# Patient Record
Sex: Male | Born: 1960 | Race: Black or African American | Hispanic: No | Marital: Married | Smoking: Former smoker
Health system: Southern US, Community
[De-identification: ages and names within clinical notes are randomized; demographics above are authoritative.]

## PROBLEM LIST (undated history)

## (undated) DIAGNOSIS — I10 Essential (primary) hypertension: Secondary | ICD-10-CM

## (undated) DIAGNOSIS — K429 Umbilical hernia without obstruction or gangrene: Secondary | ICD-10-CM

## (undated) DIAGNOSIS — I5022 Chronic systolic (congestive) heart failure: Principal | ICD-10-CM

## (undated) DIAGNOSIS — E785 Hyperlipidemia, unspecified: Secondary | ICD-10-CM

## (undated) DIAGNOSIS — I472 Ventricular tachycardia: Secondary | ICD-10-CM

## (undated) DIAGNOSIS — I255 Ischemic cardiomyopathy: Secondary | ICD-10-CM

## (undated) DIAGNOSIS — I241 Dressler's syndrome: Secondary | ICD-10-CM

## (undated) DIAGNOSIS — I219 Acute myocardial infarction, unspecified: Secondary | ICD-10-CM

## (undated) DIAGNOSIS — I2510 Atherosclerotic heart disease of native coronary artery without angina pectoris: Secondary | ICD-10-CM

## (undated) DIAGNOSIS — K219 Gastro-esophageal reflux disease without esophagitis: Secondary | ICD-10-CM

## (undated) HISTORY — PX: DEFIBRILLATOR FUNCTION TEST: SHX5795

## (undated) HISTORY — DX: Gastro-esophageal reflux disease without esophagitis: K21.9

## (undated) HISTORY — DX: Chronic systolic (congestive) heart failure: I50.22

## (undated) HISTORY — DX: Essential (primary) hypertension: I10

## (undated) HISTORY — DX: Dressler's syndrome: I24.1

## (undated) HISTORY — DX: Atherosclerotic heart disease of native coronary artery without angina pectoris: I25.10

## (undated) HISTORY — DX: Ischemic cardiomyopathy: I25.5

## (undated) HISTORY — DX: Acute myocardial infarction, unspecified: I21.9

## (undated) HISTORY — DX: Hyperlipidemia, unspecified: E78.5

## (undated) HISTORY — DX: Umbilical hernia without obstruction or gangrene: K42.9

---
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
## 2002-11-10 ENCOUNTER — Emergency Department (HOSPITAL_COMMUNITY): Admission: EM | Admit: 2002-11-10 | Discharge: 2002-11-10 | Payer: Self-pay | Admitting: Emergency Medicine

## 2003-03-31 ENCOUNTER — Ambulatory Visit (HOSPITAL_COMMUNITY): Admission: RE | Admit: 2003-03-31 | Discharge: 2003-03-31 | Payer: Self-pay | Admitting: General Surgery

## 2005-03-30 ENCOUNTER — Emergency Department (HOSPITAL_COMMUNITY): Admission: EM | Admit: 2005-03-30 | Discharge: 2005-03-30 | Payer: Self-pay | Admitting: Emergency Medicine

## 2005-11-19 ENCOUNTER — Ambulatory Visit: Payer: Self-pay | Admitting: Family Medicine

## 2006-03-05 ENCOUNTER — Ambulatory Visit: Payer: Self-pay | Admitting: Cardiovascular Disease

## 2006-03-05 ENCOUNTER — Inpatient Hospital Stay (HOSPITAL_COMMUNITY): Admission: AD | Admit: 2006-03-05 | Discharge: 2006-03-11 | Payer: Self-pay | Admitting: Cardiology

## 2006-03-05 ENCOUNTER — Encounter: Payer: Self-pay | Admitting: Emergency Medicine

## 2006-03-06 ENCOUNTER — Encounter: Payer: Self-pay | Admitting: Cardiology

## 2006-03-18 ENCOUNTER — Encounter (HOSPITAL_COMMUNITY): Admission: RE | Admit: 2006-03-18 | Discharge: 2006-04-17 | Payer: Self-pay | Admitting: Cardiovascular Disease

## 2006-03-31 ENCOUNTER — Ambulatory Visit: Payer: Self-pay | Admitting: Cardiovascular Disease

## 2006-04-07 ENCOUNTER — Encounter: Payer: Self-pay | Admitting: Family Medicine

## 2006-04-07 DIAGNOSIS — I10 Essential (primary) hypertension: Secondary | ICD-10-CM | POA: Insufficient documentation

## 2006-04-07 DIAGNOSIS — K219 Gastro-esophageal reflux disease without esophagitis: Secondary | ICD-10-CM | POA: Insufficient documentation

## 2006-04-07 DIAGNOSIS — K649 Unspecified hemorrhoids: Secondary | ICD-10-CM | POA: Insufficient documentation

## 2006-04-19 ENCOUNTER — Emergency Department (HOSPITAL_COMMUNITY): Admission: EM | Admit: 2006-04-19 | Discharge: 2006-04-20 | Payer: Self-pay | Admitting: Emergency Medicine

## 2006-05-21 ENCOUNTER — Encounter: Payer: Self-pay | Admitting: Cardiology

## 2006-05-21 ENCOUNTER — Ambulatory Visit: Payer: Self-pay | Admitting: Cardiovascular Disease

## 2006-05-21 ENCOUNTER — Ambulatory Visit: Payer: Self-pay

## 2006-06-12 ENCOUNTER — Ambulatory Visit: Payer: Self-pay | Admitting: Cardiovascular Disease

## 2006-06-12 LAB — CONVERTED CEMR LAB
ALT: 22 units/L (ref 0–40)
Albumin: 3.6 g/dL (ref 3.5–5.2)
Alkaline Phosphatase: 43 units/L (ref 39–117)
HDL: 33.6 mg/dL — ABNORMAL LOW (ref >39.0)
LDL Cholesterol: 35 mg/dL (ref 0–99)
Total Protein: 6.3 g/dL (ref 6.0–8.3)

## 2006-10-10 ENCOUNTER — Ambulatory Visit: Payer: Self-pay | Admitting: Cardiovascular Disease

## 2006-10-15 ENCOUNTER — Emergency Department (HOSPITAL_COMMUNITY): Admission: EM | Admit: 2006-10-15 | Discharge: 2006-10-15 | Payer: Self-pay | Admitting: Emergency Medicine

## 2007-03-16 ENCOUNTER — Emergency Department (HOSPITAL_COMMUNITY): Admission: EM | Admit: 2007-03-16 | Discharge: 2007-03-16 | Payer: Self-pay | Admitting: Emergency Medicine

## 2007-03-31 ENCOUNTER — Ambulatory Visit (HOSPITAL_COMMUNITY): Admission: RE | Admit: 2007-03-31 | Discharge: 2007-03-31 | Payer: Self-pay | Admitting: Orthopaedic Surgery

## 2007-04-05 HISTORY — PX: FINGER SURGERY: SHX640

## 2007-04-09 ENCOUNTER — Ambulatory Visit: Payer: Self-pay | Admitting: Cardiovascular Disease

## 2007-06-08 ENCOUNTER — Ambulatory Visit: Payer: Self-pay | Admitting: Cardiovascular Disease

## 2007-06-08 LAB — CONVERTED CEMR LAB
ALT: 26 units/L (ref 0–53)
AST: 22 units/L (ref 0–37)
Albumin: 3.8 g/dL (ref 3.5–5.2)
Bilirubin, Direct: 0.1 mg/dL (ref 0.0–0.3)
CO2: 30 meq/L (ref 19–32)
Chloride: 108 meq/L (ref 96–112)
Creatinine, Ser: 0.9 mg/dL (ref 0.4–1.5)
GFR calc Af Amer: 116 mL/min
Glucose, Bld: 99 mg/dL (ref 70–99)
HDL: 30.4 mg/dL — ABNORMAL LOW (ref >39.0)
Sodium: 142 meq/L (ref 135–145)
Total Protein: 6.8 g/dL (ref 6.0–8.3)

## 2008-01-05 ENCOUNTER — Ambulatory Visit: Payer: Self-pay | Admitting: Cardiovascular Disease

## 2008-07-19 DIAGNOSIS — E785 Hyperlipidemia, unspecified: Secondary | ICD-10-CM | POA: Insufficient documentation

## 2008-07-20 ENCOUNTER — Ambulatory Visit: Payer: Self-pay

## 2008-07-20 ENCOUNTER — Encounter: Payer: Self-pay | Admitting: Cardiovascular Disease

## 2008-07-20 ENCOUNTER — Ambulatory Visit: Payer: Self-pay | Admitting: Cardiovascular Disease

## 2008-07-24 DIAGNOSIS — I2510 Atherosclerotic heart disease of native coronary artery without angina pectoris: Secondary | ICD-10-CM | POA: Insufficient documentation

## 2008-07-27 ENCOUNTER — Telehealth: Payer: Self-pay | Admitting: Cardiovascular Disease

## 2008-07-28 ENCOUNTER — Encounter (INDEPENDENT_AMBULATORY_CARE_PROVIDER_SITE_OTHER): Payer: Self-pay | Admitting: *Deleted

## 2008-08-08 ENCOUNTER — Ambulatory Visit (HOSPITAL_COMMUNITY): Admission: RE | Admit: 2008-08-08 | Discharge: 2008-08-08 | Payer: Self-pay | Admitting: Cardiovascular Disease

## 2008-08-08 ENCOUNTER — Ambulatory Visit: Payer: Self-pay | Admitting: Cardiovascular Disease

## 2008-09-07 ENCOUNTER — Ambulatory Visit: Payer: Self-pay | Admitting: Internal Medicine

## 2008-09-07 DIAGNOSIS — I2589 Other forms of chronic ischemic heart disease: Secondary | ICD-10-CM | POA: Insufficient documentation

## 2008-09-07 DIAGNOSIS — I255 Ischemic cardiomyopathy: Secondary | ICD-10-CM | POA: Insufficient documentation

## 2010-04-01 LAB — CONVERTED CEMR LAB
ALT: 28 units/L (ref 0–53)
AST: 24 units/L (ref 0–37)
Alkaline Phosphatase: 50 units/L (ref 39–117)
Basophils Relative: 0.1 % (ref 0.0–3.0)
Chloride: 106 meq/L (ref 96–112)
Cholesterol: 115 mg/dL (ref 0–200)
Creatinine, Ser: 1.2 mg/dL (ref 0.4–1.5)
Eosinophils Relative: 2.4 % (ref 0.0–5.0)
HDL: 29.2 mg/dL — ABNORMAL LOW (ref >39.00)
Hemoglobin: 13.4 g/dL (ref 13.0–17.0)
LDL Cholesterol: 65 mg/dL (ref 0–99)
Lymphocytes Relative: 38.2 % (ref 12.0–46.0)
Lymphs Abs: 3 10*3/uL (ref 0.7–4.0)
Monocytes Relative: 10.8 % (ref 3.0–12.0)
Neutro Abs: 3.8 10*3/uL (ref 1.4–7.7)
Potassium: 4.3 meq/L (ref 3.5–5.1)
RBC: 4.47 M/uL (ref 4.22–5.81)
Sodium: 140 meq/L (ref 135–145)
Total CHOL/HDL Ratio: 4
Triglycerides: 105 mg/dL (ref 0.0–149.0)
VLDL: 21 mg/dL (ref 0.0–40.0)
WBC: 7.8 10*3/uL (ref 4.5–10.5)

## 2010-05-21 ENCOUNTER — Ambulatory Visit (INDEPENDENT_AMBULATORY_CARE_PROVIDER_SITE_OTHER): Payer: Self-pay | Admitting: Cardiovascular Disease

## 2010-05-21 ENCOUNTER — Other Ambulatory Visit: Payer: Self-pay | Admitting: Cardiovascular Disease

## 2010-05-21 ENCOUNTER — Encounter: Payer: Self-pay | Admitting: Cardiovascular Disease

## 2010-05-21 DIAGNOSIS — I5022 Chronic systolic (congestive) heart failure: Secondary | ICD-10-CM

## 2010-05-21 DIAGNOSIS — I10 Essential (primary) hypertension: Secondary | ICD-10-CM

## 2010-05-21 DIAGNOSIS — I252 Old myocardial infarction: Secondary | ICD-10-CM

## 2010-05-21 DIAGNOSIS — E7800 Pure hypercholesterolemia, unspecified: Secondary | ICD-10-CM

## 2010-05-21 DIAGNOSIS — E785 Hyperlipidemia, unspecified: Secondary | ICD-10-CM

## 2010-05-21 LAB — BASIC METABOLIC PANEL
BUN: 13 mg/dL (ref 6–23)
CO2: 29 mEq/L (ref 19–32)
Calcium: 8.9 mg/dL (ref 8.4–10.5)
GFR: 110.44 mL/min (ref >60.00)
Glucose, Bld: 97 mg/dL (ref 70–99)
Sodium: 136 mEq/L (ref 135–145)

## 2010-05-21 LAB — LIPID PANEL: LDL Cholesterol: 67 mg/dL (ref 0–99)

## 2010-05-21 LAB — HEPATIC FUNCTION PANEL
AST: 24 U/L (ref 0–37)
Albumin: 4.1 g/dL (ref 3.5–5.2)
Alkaline Phosphatase: 48 U/L (ref 39–117)
Bilirubin, Direct: 0.1 mg/dL (ref 0.0–0.3)
Total Protein: 6.9 g/dL (ref 6.0–8.3)

## 2010-05-29 ENCOUNTER — Encounter: Payer: Self-pay | Admitting: Cardiovascular Disease

## 2010-06-05 NOTE — Assessment & Plan Note (Signed)
Summary: rov/F3M/mj    Visit Type:  Follow-up Referring Provider:  Dayle Points, MD Primary Provider:  Dr Juanetta Gosling  CC:  sob.  History of Present Illness: This is a 50 year-old gentleman presenting for followup of CAD and ischemic CM. He had a large anterior infarct in 2008 with severe residual LV dysfunction. The patient hasn't followed up in nearly 2 years. He was last seen by Dr Johney Frame for discussion of an ICD and he now would like to revisit this.  He complains of dyspnea with moderate exertion. No chest pain, lightheadedness, syncope, or palpitations. No edema, orthopnea, or PND. He has not been engaged in exercise.  Current Medications (verified): 1)  Simvastatin 80 Mg Tabs (Simvastatin) .... Take One Tablet By Mouth Daily At Bedtime 2)  Aspirin Ec 325 Mg Tbec (Aspirin) .... Take One Tablet By Mouth Daily 3)  Fish Oil 1200 Mg Caps (Omega-3 Fatty Acids) .... Take 1 Capsule By Mouth Once A Day 4)  Lisinopril 10 Mg Tabs (Lisinopril) .... Take One Tablet By Mouth Daily 5)  Coreg 25 Mg Tabs (Carvedilol) .... Take 1 Tablet By Mouth Two Times A Day 6)  Nitroglycerin 0.4 Mg Subl (Nitroglycerin) .... One Tablet Under Tongue Every 5 Minutes As Needed For Chest Pain---May Repeat Times Three 7)  Multivitamins  Tabs (Multiple Vitamin) .... Take 1 Tablet By Mouth Once A Day  Allergies: No Known Drug Allergies  Past History:  Past medical history reviewed for relevance to current acute and chronic problems.  Past Medical History: Reviewed history from 09/07/2008 and no changes required. 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 HEENT: normal Neck: normal carotid upstrokes without bruits, JVP normal Lungs: CTA CV: RRR without murmur or gallop Abd: soft, NT, positive BS, no bruit, no organomegaly Ext: no clubbing, cyanosis, or edema. peripheral pulses 2+ and equal Skin: warm and dry without rash    EKG  Procedure date:  05/21/2010  Findings:      NSR with age-indeterminate anterolateral infarct, age-indeterminate inferior infarct.  MRI EXAM  Procedure date:  08/08/2008  Findings:      Cardiac MR:   Impression:  1)    Moderate to severe LV enlargement with mid and distal anterior and inferior, septal and apical akinesis.  Findings  would be consistant with wrap around LAD infarct  2)    Full thickness scar involving large area of myocardium described by [facility] above 3)    Quantitative EF 23% 4)    Normal right sided cardiac chambers 5)    No pericardial effusion.  Impression & Recommendations:  Problem # 1:  ISCHEMIC CARDIOMYOPATHY (ICD-414.8) Pt with NYHA Class 2 symptoms. He is on appropriate med Rx and reports BP's at home 125/70's on average. Will  schedule for followup echo since LV function hasn't been assessed in past year and he would like to consider ICD. Previous LVEF 23% by cardiac MR and don't expect much improvement in setting full thickness scar. Will refer back to Dr Johney Frame.  His updated medication list for this problem includes:    Aspirin Ec 325 Mg Tbec (Aspirin) .Marland Kitchen... Take one tablet by mouth daily    Lisinopril 10 Mg Tabs (Lisinopril) .Marland Kitchen... Take one tablet by mouth daily    Coreg 25 Mg Tabs (Carvedilol) .Marland Kitchen... Take 1 tablet by mouth two times a day    Nitroglycerin 0.4 Mg Subl (Nitroglycerin) ..... One  tablet under tongue every 5 minutes as needed for chest pain---may repeat times three  Problem # 2:  CAD, NATIVE VESSEL (ICD-414.01) Stable without angina.  His updated medication list for this problem includes:    Aspirin Ec 325 Mg Tbec (Aspirin) .Marland Kitchen... Take one tablet by mouth daily    Lisinopril 10 Mg Tabs (Lisinopril) .Marland Kitchen... Take one tablet by mouth daily    Coreg 25 Mg Tabs (Carvedilol) .Marland Kitchen... Take 1 tablet by mouth two times a day    Nitroglycerin 0.4 Mg Subl (Nitroglycerin) ..... One tablet under tongue every 5 minutes as needed for chest pain---may repeat times three  Problem # 3:  HYPERLIPIDEMIA-MIXED (ICD-272.4)  Pt needs repeat lipids - will order.  His updated medication list for this problem includes:    Simvastatin 80 Mg Tabs (Simvastatin) .Marland Kitchen... Take one tablet by mouth daily at bedtime  Orders: TLB-Lipid Panel (80061-LIPID) TLB-Hepatic/Liver Function Pnl (80076-HEPATIC)  CHOL: 115 (07/20/2008)   LDL: 65 (07/20/2008)   HDL: 29.20 (07/20/2008)   TG: 105.0 (07/20/2008)  Problem # 4:  HYPERTENSION (ICD-401.9) As above BP's at home have been in range.  His updated medication list for this problem includes:    Aspirin Ec 325 Mg Tbec (Aspirin) .Marland Kitchen... Take one tablet by mouth daily    Lisinopril 10 Mg Tabs (Lisinopril) .Marland Kitchen... Take one tablet by mouth daily    Coreg 25 Mg Tabs (Carvedilol) .Marland Kitchen... Take 1 tablet by mouth two times a day  Orders: TLB-BMP (Basic Metabolic Panel-BMET) (80048-METABOL)  Patient Instructions: 1)  Your physician recommends that you schedule a follow-up appointment WITH DR Johney Frame 2)  Your physician recommends that you continue on your current medications as directed. Please refer to the Current Medication list given to you today. 3)  Your physician wants you to follow-up in:6 MONTHS WITH DR Theodoro Parma will receive a reminder letter in the mail two months in advance. If you don't receive a letter, please call our office to schedule the follow-up  appointment. Prescriptions: COREG 25 MG TABS (CARVEDILOL) Take 1 tablet by mouth two times a day  #180 x 3   Entered by:   Deliah Goody, RN   Authorized by:   Norva Karvonen, MD   Signed by:   Deliah Goody, RN on 05/21/2010   Method used:   Electronically to        [facility]  [address] (retail)       [address]       [place], [place]  [address]       Ph: [phone]       Fax: [phone]   RxID:   [id number] LISINOPRIL 10 MG TABS (LISINOPRIL) Take one tablet by mouth daily  #90 x 3   Entered by:   Deliah Goody, RN  Authorized by:   Norva Karvonen, MD   Signed by:   Deliah Goody, RN on 05/21/2010   Method used:   Electronically to        [facility]  [address] (retail)       [address]       [place], [place]  [address]       Ph: [phone]       Fax: [phone]   RxID:   [id number] SIMVASTATIN 80 MG TABS (SIMVASTATIN) Take one tablet by mouth daily at bedtime  #90 x 3   Entered by:   Deliah Goody, RN   Authorized by:   Norva Karvonen, MD   Signed by:   Deliah Goody, RN on 05/21/2010   Method used:   Electronically to        [facility]  [address] (retail)       [address]       [place], [place]  [address]       Ph: [phone]       Fax: [phone]   RxID:   [id number]   Appended Document: rov/F3M/mj Lauren - we ordered echo but I don't see it in patient instructions...can you confirm?

## 2010-06-14 ENCOUNTER — Telehealth: Payer: Self-pay | Admitting: Internal Medicine

## 2010-06-14 NOTE — Telephone Encounter (Signed)
Pt calls today b/c he could not remember who he had been referred to or when the appt is. He is seeing Dr. Johney Frame 06/27/10 at 9:30am.  Pt aware and is doing well. Mylo Red RN

## 2010-06-14 NOTE — Telephone Encounter (Signed)
Pt wants to know what doctor he was ref to.

## 2010-06-15 ENCOUNTER — Telehealth: Payer: Self-pay

## 2010-06-15 DIAGNOSIS — I2510 Atherosclerotic heart disease of native coronary artery without angina pectoris: Secondary | ICD-10-CM

## 2010-06-15 DIAGNOSIS — I2589 Other forms of chronic ischemic heart disease: Secondary | ICD-10-CM

## 2010-06-15 NOTE — Telephone Encounter (Signed)
I spoke with the pt and made him aware that he should have been scheduled for a follow-up echo from his 05/21/10 OV with Dr Excell Seltzer.  This test was not scheduled.  I made an appointment for the pt to have an echo performed on 06/25/10 at 3:00.  The pt is scheduled to see Dr Johney Frame on 06/27/10 for ICD consideration.

## 2010-06-25 ENCOUNTER — Ambulatory Visit (HOSPITAL_COMMUNITY): Payer: Self-pay | Attending: Cardiovascular Disease | Admitting: Radiology

## 2010-06-25 DIAGNOSIS — R0989 Other specified symptoms and signs involving the circulatory and respiratory systems: Secondary | ICD-10-CM | POA: Insufficient documentation

## 2010-06-25 DIAGNOSIS — R0609 Other forms of dyspnea: Secondary | ICD-10-CM | POA: Insufficient documentation

## 2010-06-25 DIAGNOSIS — I059 Rheumatic mitral valve disease, unspecified: Secondary | ICD-10-CM | POA: Insufficient documentation

## 2010-06-25 DIAGNOSIS — I079 Rheumatic tricuspid valve disease, unspecified: Secondary | ICD-10-CM | POA: Insufficient documentation

## 2010-06-25 DIAGNOSIS — I2510 Atherosclerotic heart disease of native coronary artery without angina pectoris: Secondary | ICD-10-CM | POA: Insufficient documentation

## 2010-06-25 DIAGNOSIS — E785 Hyperlipidemia, unspecified: Secondary | ICD-10-CM | POA: Insufficient documentation

## 2010-06-25 DIAGNOSIS — I2589 Other forms of chronic ischemic heart disease: Secondary | ICD-10-CM

## 2010-06-25 DIAGNOSIS — I10 Essential (primary) hypertension: Secondary | ICD-10-CM | POA: Insufficient documentation

## 2010-06-26 ENCOUNTER — Encounter: Payer: Self-pay | Admitting: Internal Medicine

## 2010-06-27 ENCOUNTER — Ambulatory Visit (INDEPENDENT_AMBULATORY_CARE_PROVIDER_SITE_OTHER): Payer: Self-pay | Admitting: Internal Medicine

## 2010-06-27 ENCOUNTER — Encounter: Payer: Self-pay | Admitting: Internal Medicine

## 2010-06-27 DIAGNOSIS — I2589 Other forms of chronic ischemic heart disease: Secondary | ICD-10-CM

## 2010-06-27 DIAGNOSIS — I5022 Chronic systolic (congestive) heart failure: Secondary | ICD-10-CM

## 2010-06-27 DIAGNOSIS — E785 Hyperlipidemia, unspecified: Secondary | ICD-10-CM

## 2010-06-27 DIAGNOSIS — I10 Essential (primary) hypertension: Secondary | ICD-10-CM

## 2010-06-27 DIAGNOSIS — I5020 Unspecified systolic (congestive) heart failure: Secondary | ICD-10-CM | POA: Insufficient documentation

## 2010-06-27 HISTORY — DX: Chronic systolic (congestive) heart failure: I50.22

## 2010-06-27 NOTE — Patient Instructions (Signed)
AICD implant May 31st  830am arrival for a 1030am case [facility] Call us if you can keep this appointment when you have insurance coverage. [phone]

## 2010-06-27 NOTE — Assessment & Plan Note (Signed)
He is on simvastatin 80mg  daily.  I have discussed with Dr Earmon Phoenix nurse to determine treatment strategy going forward.

## 2010-06-27 NOTE — Assessment & Plan Note (Signed)
The patient has an ischemic CM (EF 25% by echo 3/12), NYHA Class II/III CHF, and CAD. At this time, he meets MADIT II/ SCD-HeFT criteria for ICD implantation for primary prevention of sudden death.  His QRS is 98ms.  He is therefore not a candidate for CRT.  Risks, benefits, alternatives to ICD implantation were discussed in detail with the patient today. The patient  understands that the risks include but are not limited to bleeding, infection, pneumothorax, perforation, tamponade, vascular damage, renal failure, MI, stroke, death, inappropriate shocks, and lead dislodgement and wishes to proceed.  We will therefore schedule device implantation at the next available time.  He will also consider Analyze ST study and will discuss this with our research staff.  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  He is also aware than an ICD is not typically a guarantee for disability.  His mother in law recently died suddenly after declining to have an ICD implanted and he states that he therefore thinks that now is the appropriate time for him to proceed.

## 2010-06-27 NOTE — Assessment & Plan Note (Signed)
No ischemic symptoms. No changes today.  Medicines are optimized.

## 2010-06-27 NOTE — Assessment & Plan Note (Signed)
Controlled.  

## 2010-06-27 NOTE — Progress Notes (Signed)
The patient presents today for routine electrophysiology followup.  Since last being seen in our clinic, the patient reports doing very well.  He remains reasonably active but reports dypsnea with moderate activity.  He feels that if he walks more than several blocks he becomes SOB.  Today, he denies symptoms of palpitations, chest pain,  orthopnea, PND, lower extremity edema, dizziness, presyncope, syncope, or neurologic sequela.  The patient feels that he is tolerating medications without difficulties and is otherwise without complaint today.  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  Past Medical History  Diagnosis Date  . CAD (coronary artery disease)     anterior MI 2008, treated with bare metal stent to LAD  . MI (myocardial infarction)   . Ischemic cardiomyopathy     NYHA Class II/III CHF  . 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 HLD (hyperlipidemia)   . HTN (hypertension)   . GERD (gastroesophageal reflux disease)   . Umbilical hernia    Past Surgical History  Procedure Date  . Finger surgery 2/09    laceration repair    Current Outpatient Prescriptions  Medication Sig Dispense Refill  . aspirin 325 MG tablet Take 325 mg by mouth daily.        . carvedilol (COREG) 25 MG tablet Take 25 mg by mouth 2 (two) times daily with a meal.        . fish oil-omega-3 fatty acids 1000 MG capsule Take 1 g by mouth daily.       Marland Kitchen lisinopril (PRINIVIL,ZESTRIL) 10 MG tablet Take 10 mg by mouth daily.        . Multiple Vitamin (MULTIVITAMIN) tablet Take 1 tablet by mouth daily.        . nitroGLYCERIN (NITROSTAT) 0.4 MG SL tablet Place 0.4 mg under the tongue every 5 (five) minutes as needed.        . simvastatin (ZOCOR) 80 MG tablet Take 80 mg by mouth at bedtime.          No Known Allergies  History   Social History  . Marital Status: Married    Spouse Name: N/A    Number of Children: N/A  . Years of Education: N/A   Occupational History  . truck driver     Social History Main Topics  . Smoking status: Former Smoker    Types: Cigars    Quit date: 03/04/2006  . Smokeless tobacco: Not on file  . Alcohol Use: Yes     occasionally  . Drug Use: No  . Sexually Active: Not on file   Other Topics Concern  . Not on file   Social History Narrative   Lives in [place] with spouse.  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    Family History  Problem Relation Age of Onset  . Heart failure    . Diabetes      ROS-  All systems are reviewed and are negative except as outlined in the HPI above  Physical Exam: Filed Vitals:   06/27/10 0923  BP: 132/82  Pulse: 80  Height: 5\' 10"  (1.778 m)  Weight: 229 lb 1.9 oz (103.928 kg)    GEN- The patient is well appearing, alert and oriented x 3 today.   Head- normocephalic, atraumatic Eyes-  Sclera clear, conjunctiva pink Ears- hearing intact Oropharynx- clear Neck- supple, no JVP Lymph- no cervical lymphadenopathy Lungs- Clear to ausculation bilaterally, normal work  of breathing Heart- Regular rate and rhythm, no murmurs, rubs or gallops, PMI not laterally displaced GI- soft, NT, ND, + BS Extremities- no clubbing, cyanosis, or edema MS- no significant deformity or atrophy Skin- no rash or lesion Psych- euthymic mood, full affect Neuro- strength and sensation are intact  EKG- sinus rhythm 74 bpm, PR 162, QRS 98, anterolateral and inferior infarction  Assessment and Plan:

## 2010-07-09 ENCOUNTER — Encounter: Payer: Self-pay | Admitting: Cardiovascular Disease

## 2010-07-09 NOTE — Telephone Encounter (Signed)
York Spaniel he has been waiting for a disability letter since last visit, please call when ready and will come pick it up

## 2010-07-13 ENCOUNTER — Telehealth: Payer: Self-pay | Admitting: Cardiovascular Disease

## 2010-07-13 NOTE — Telephone Encounter (Signed)
Called this week needing a note for Disbility.  He has not heard anything regarding this note.  Please check and call pt.

## 2010-07-13 NOTE — Telephone Encounter (Signed)
I called and spoke with the pt. He states he spoke with Dr. Excell Seltzer about needing a note for disability. I explained that the message from earlier this week has been forwarded to Dr. Excell Seltzer, but there is no letter yet. I explained Dr. Excell Seltzer has been out of the office, but I will have his nurse, Lauren, contact him on Monday to update him on what is going on. He needs the note ASAP. 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 He is agreeable with Lauren calling him on Monday.

## 2010-07-16 NOTE — Telephone Encounter (Signed)
I spoke with the pt and made him aware that Dr Excell Seltzer is aware he needs disability letter.  We will try to complete letter this week and I will call pt when ready. Pt agrees with plan.

## 2010-07-16 NOTE — Telephone Encounter (Signed)
This encounter was created in error - please disregard.

## 2010-07-17 NOTE — Assessment & Plan Note (Signed)
[facility]                            CARDIOLOGY OFFICE NOTE   NAME:[name] [name], [name] [name]                   MRN:          [id number]  DATE:04/09/2007                            DOB:          04-23-1960    HISTORY:  [name] [name] was seen in followup at the [facility]  Cardiology office on April 09, 2007.  He is a 50 year old gentleman  with coronary artery disease.  He is now 1 year out from a large  anterior wall MI.  His MI was complicated by heavy thrombus burden and  distal clot in the LAD after stenting as well as post-MI pericarditis.   [name] [name] is doing relatively well at present.  He has returned to  work for some time now and is able to perform regular activities without  any symptoms.  He walks for exercise, but has not engaged in any heavy  physical exertion.  He has quit smoking since the time of his  infarction, but unfortunately he has gained a good bit of weight over  the last year.  He specifically denies chest pain, dyspnea, orthopnea,  PND or edema.   MEDICATIONS:  1. Simvastatin 80 mg at bedtime.  2. Aspirin 325 mg daily.  3. Fish oil daily.  4. Lisinopril 10 mg daily.  5. Coreg 25 mg twice daily.   ALLERGIES:  NKDA.   PHYSICAL EXAMINATION:  GENERAL:  On exam, the patient is alert,  oriented.  He is in no acute distress.  VITAL SIGNS:  Weight is 197, blood pressure 113/70, heart rate 67,  respiratory rate is 16.  HEENT:  Normal.  NECK:  Normal, carotid upstrokes without bruits.  Jugular venous  pressure is normal.  LUNGS:  Clear to auscultation bilaterally.  HEART:  Regular rate and rhythm without murmurs or gallops.  ABDOMEN:  Soft, nontender.  No organomegaly.  No abdominal bruits.  EXTREMITIES:  No clubbing, cyanosis or edema.  Peripheral pulses are 2+  and equal throughout.   DIAGNOSTICS:  EKG shows sinus rhythm with anterolateral MI, age  indeterminate and inferior MI, age indeterminate.  There are no acute  ST-  segment changes.   ASSESSMENT:  1. Coronary artery disease status post anterolateral myocardial      infarction.  The patient remains asymptomatic.  Continue current      medical regimen with aggressive secondary risk reduction.  He is      tolerating the goal dose of Coreg and is also tolerating lisinopril      without difficulty.  His post-MI LVEF was in the range of 35-40%      back when it was checked in March 2008.  2. Dyslipidemia.  He remains on simvastatin 80 mg.  Lipids in April      2008 showed a cholesterol of 78, HDL 34, LDL 35.  Continue current      therapy with recheck lipids and LFTs in 2 months.  3. Weight gain.  [name] [name] has gained 21 pounds over the last      year.  He was counseled on diet and exercise strategies.  Veverly Fells. Excell Seltzer, MD  Electronically Signed    MDC/MedQ  DD: 04/09/2007  DT: 04/10/2007  Job #: [id number]

## 2010-07-17 NOTE — Assessment & Plan Note (Signed)
[facility]                            CARDIOLOGY OFFICE NOTE   NAME:[name] [name] CALISE                   MRN:          [id number]  DATE:10/10/2006                            DOB:          August 30, 1960    [name] [name] returns for followup at the [facility] Cardiology office  on October 10, 2006.  [name] [name] is a 50 year old gentleman who  suffered an anterior wall myocardial infarction in January of this year.  [name] [name] was treated with stenting of the LAD but had poor flow at the  conclusion of the procedure secondary to thrombus burden in the apical  portion of the LAD.  [name] [name] also had post-MI pericarditis.  From a  symptomatic stand-point [name] [name] is doing very well.  [name] [name] has no complaints at  this point.  [name] [name] has been active and doing yard work and regular walking.  [name] [name] specifically denies chest pain, dyspnea, orthopnea, PND, light  headedness, syncope or palpitations.   CURRENT MEDICATIONS:  Include Simvastatin 80 mg, aspirin 325 mg, Coreg  12.5 mg twice daily, fish oil and Lisinopril 10 mg daily.   ALLERGIES:  NO KNOWN DRUG ALLERGIES.   EXAMINATION:  GENERAL:  [name] [name] is alert and oriented, in no acute distress.  VITAL SIGNS:  Weight is 179 pounds, blood pressure is 128/85, heart rate  is 59, respiratory rate 16.  HEENT:  Normal.  NECK:  Normal carotid upstrokes without bruits, jugular venous pressures  normal.  LUNGS:  Clear to auscultation bilaterally.  HEART:  The apex is discreet, non displaced, regular rate and rhythm  without murmurs or gallops.  ABDOMEN:  Soft, nontender, no organomegaly.  No abdominal bruits.  EXTREMITIES:  No clubbing, cyanosis or edema.  Peripheral pulses are 2+  and equal throughout.   ASSESSMENT:  PROBLEM #1:  Coronary artery disease status post anterior  wall myocardial infarction.  [name] [name] has moderate left ventricular  dysfunction secondary to his MI with an LVEF of 35-40%.  Will continue  his current medical therapy with a doubling  of his Coreg from 12.5 to 25  mg twice daily. Otherwise [name] [name] should remain on aspirin and Lisinopril at  their current doses.  [name] [name] is asymptomatic and I am very happy with his  clinical course at this point.  Will plan on seeing him back in 6 months  or sooner if any new problems arise.   PROBLEM #2:  Dyslipidemia.  [name] [name] is on simvastatin 80 mg at h.s.  LFTs  were normal in April and his lipid panel showed a total cholesterol of  78 with an LDL of 35 and an HDL of 34.   PROBLEM #3:  Hypertension.  Optimal control on a combination of  Lisinopril and Coreg.   PROBLEM #4:  Post MI pericarditis.  No clinical recurrence at this  point.   DISPOSITION:  I encouraged [name] [name] with regard to his walking  program.  [name] [name] is doing an excellent job of maintaining a medical program.  As above, I will see him back in followup in 6 months.     Veverly Fells. Excell Seltzer, MD  Electronically Signed  MDC/MedQ  DD: 10/17/2006  DT: 10/18/2006  Job #: [id number]

## 2010-07-17 NOTE — Assessment & Plan Note (Signed)
[facility]                            CARDIOLOGY OFFICE NOTE   NAME:[name] [name], [name] [name]                   MRN:          [id number]  DATE:01/05/2008                            DOB:          [birth year]    REASON FOR VISIT:  Followup CAD.   HISTORY OF PRESENT ILLNESS:  [name] [name] is a 50 year old gentleman  with coronary artery disease who had a anterolateral myocardial  infarction in 2008 complicated by post-MI pericarditis.  The patient  presents today for followup.  He is currently asymptomatic from a  cardiovascular standpoint.  He specifically denies chest pain, dyspnea,  orthopnea, PND, edema, lightheadedness, palpitations, or syncope.  He  has no claudication symptoms.  He has gained more weight since his last  visit.  This has become a trend over the course of his followup ever  since he discontinued cigarettes.  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  He thinks he will be able to control is  life much better with that job and is motivated to start an exercise  program.   MEDICATIONS:  1. Simvastatin 80 mg at bedtime.  2. Aspirin 325 mg daily.  3. Fish oil 1 daily.  4. Lisinopril 10 mg daily.  5. Coreg 25 mg twice daily.  6. Multivitamin 1 daily.   ALLERGIES:  NKDA.   PHYSICAL EXAMINATION:  GENERAL:  The patient is alert and oriented in no  acute distress.  VITAL SIGNS:  Weight is 208 pounds, this is increased from his visit in  February when he was 197 pounds.  At his visit in January 2008, he  weighed 176 pounds.  Blood pressure 139/87, heart rate 68, respiratory  rate 12.  HEENT:  Normal.  NECK:  Normal carotid upstrokes.  No bruits.  JVP normal.  LUNGS:  Clear bilaterally.  HEART:  Regular rate and rhythm.  No murmurs or gallops.  ABDOMEN:  Soft, nontender.   No organomegaly.  EXTREMITIES:  No clubbing, cyanosis, or edema.  Peripheral pulses are  intact and equal.   EKG shows normal sinus rhythm with age-indeterminate anterolateral MI  and age-indeterminate inferior MI.  There are no acute ST-segment or T-  wave changes.   ASSESSMENT:  1. Coronary artery disease status post anterolateral myocardial      infarction.  The patient is asymptomatic with no angina.  We will      continue his current medical program.  His follow up 2-D      echocardiogram from March 2008 showed an left ventricular ejection      fraction of 35-40%.  Since his left ventricular function was      borderline at that time, we will plan on follow up 2-D echo at the      time of his next office visit to reassess his left ventricular      function so that we can rule out adverse remodeling.  2. We will continue his current medical program with lisinopril and      carvedilol as well as full-dose aspirin.  3. Dyslipidemia.  He is on high-dose simvastatin.  Lipids from April      2009 showed a cholesterol of 87, triglycerides 44, HDL 30, LDL 48.      We will repeat at the time of his follow up office visit in April      2010.  4. Weight gain.  Extensive discussion again regarding strategies at      weight loss and specifically that we discussed reduction of starch      in his diet and increased exercise.   For followup, I will see Mr. Mozley back in 6 months.     Veverly Fells. Excell Seltzer, MD  Electronically Signed    MDC/MedQ  DD: 01/05/2008  DT: 01/05/2008  Job #: [id number]   cc:   Ramon Dredge L. Juanetta Gosling, M.D.

## 2010-07-17 NOTE — Op Note (Signed)
NAMEPRITHVI, [name] [name]            ACCOUNT NO.:  [id number]   MEDICAL RECORD NO.:  [id number]          PATIENT TYPE:  AMB   LOCATION:  DAY                           FACILITY:  [facility]   PHYSICIAN:  J. Darreld Mclean, M.D. DATE OF BIRTH:  1960/10/08   DATE OF PROCEDURE:  03/31/2007  DATE OF DISCHARGE:                               OPERATIVE REPORT   PREOPERATIVE DIAGNOSIS:  Laceration of the extensor tendon right long  finger near the DIP joint.   POSTOPERATIVE DIAGNOSIS:  Laceration of the extensor tendon right long  finger near the DIP joint.   PROCEDURE:  Repair of extensor tendon near the DIP joint.   ANESTHESIA:  Bier block.   TOURNIQUET TIME:  Please refer to anesthesia record.   DRAINS:  None.   Finger dressing plus splint applied at the end of the procedure.   SURGEON:  J. Darreld Mclean, M.D.   INDICATIONS:  Patient is a 46-year male who cut his the right long  finger piece of metal at his home.  He was seen in the emergency room,  there was a question of a partial tear of the extensor tendon.  I saw  him in the office the following day.  He had partial function of the  tendon.  We kept him in the splint.  I saw him back in the office late  last week and it was obvious that he had loss of extension of the DIP  joint.  The wound had healed and the sutures were removed.  I explained  to him that he needed surgical repair.  He appeared to understand the  risks and imponderables were discussed.  He appeared to understand and  agreed to the procedure.   PROCEDURE:  Patient was seen in the holding area.  The extensor tendon  to the right long finger was marked by him as the site, I marked it.  He  was brought to the operating room and placed supine on the operating  room table with hand table attached.  He was given Bier block  anesthesia.  He was prepped and draped in the usual manner.  We had a  time-out identifying [name] as the patient and the right long  finger  dorsal as the correct surgical site.  The previous wound was  opened and there was no signs of extensor tendon there so two flaps were  developed sharply, held with 4-0 silk and both ends of the extensor  tendon could easily be seen and retracted approximately 0.5 cm.  The  ends were freed, the ends were cut smoothly with a 10 blade using a  tongue blade as the base.  Smooth ends were then reapproximated using 5-  0 Prolene in either simple or interrupted vertical mattress manner.  The  wound looked good.  The ends came together very nicely.  Skin was then  reapproximated using 3-0 nylon, sterile  dressing applied and finger dressing applied.  Volar dorsal splint  applied.  Tourniquet was deflated in the usual fashion after Bier block.  The patient given Tylox for pain.  I will  see him in the office next  week for wound check.  Begin physical therapy a few days later.  If any  difficulties, contact me through the office or hospital beeper system.           ______________________________  Shela Commons. Darreld Mclean, M.D.     JWK/MEDQ  D:  03/31/2007  T:  03/31/2007  Job:  [id number]

## 2010-07-17 NOTE — H&P (Signed)
NAMEKO, BARDON            ACCOUNT NO.:  [id number]   MEDICAL RECORD NO.:  [id number]          PATIENT TYPE:  AMB   LOCATION:  DAY                           FACILITY:  [facility]   PHYSICIAN:  J. Darreld Mclean, M.D. DATE OF BIRTH:  [birth year]   DATE OF ADMISSION:  03/31/2007  DATE OF DISCHARGE:  LH                              HISTORY & PHYSICAL   CHIEF COMPLAINT:  I cut my finger.   The patient is a 50 year old male who cut the extensor tendon to the  right long finger on a piece of metal at his home on March 16, 2007.  He was seen in the emergency room.  The wound was sutured.  I saw him  the following day, and he was in pain, but he could barely extend the  finger.  I put him in a split.  We looked at the wound to make sure  there was no evidence of infection or inflammation.  The wound has  healed.  He came to the office on March 26, 2007.  He has lack of  extension of the long finger at the IP joint.  There are no signs of  infection.  The wound has healed, and sutures were removed.  I told him  he is going to need repair of the extensor tendon.  The patient has a  history of heart disease and hypertension.  He has no allergies.  He is  currently taking simvastatin, Coreg, and lisinopril.  He does not smoke  or use alcohol.  He is status post stents in his heart 2007.  Hypertension and diabetes run in the family.  He is married and lives in  [place].  He is a Technical sales engineer.   PHYSICAL EXAMINATION:  VITAL SIGNS:  BP is 120/80, pulse 76,  respirations 16, afebrile, height 5 feet 9-1/4 inches, weight 200  pounds.  GENERAL:  He is alert, cooperative, oriented.  HEENT: Negative.  NECK: Supple.  LUNGS: Clear to P&A.  HEART:  Regular rhythm, without murmur heard.  ABDOMEN:  Soft, nontender, without masses.  EXTREMITIES:  Right long finger:  He has a laceration just proximal to  the DIP joint area and lack of full extension.  He has some slight  hyperextension at the PIP  joint.  There is no erythema, no purulence.  Other extremities within normal limits.  CNS: Intact.  SKIN:  Intact.   IMPRESSION:  Laceration right long extensor tendon.   PLAN:  Repair of extensor tender right long finger.  His labs are  pending.  Risks and imponderables of the procedure have been discussed  and explained to the patient, who appears to understand and agrees to  the procedure.                                           ______________________________  J. Darreld Mclean, M.D.    JWK/MEDQ  D:  03/27/2007  T:  03/27/2007  Job:  [id number]

## 2010-07-18 ENCOUNTER — Encounter: Payer: Self-pay | Admitting: Cardiovascular Disease

## 2010-07-18 NOTE — Telephone Encounter (Signed)
Note completed by Dr Excell Seltzer.  I spoke with the pt and he would like letter mailed to his home.  Letter placed in out going mail.

## 2010-07-20 NOTE — Discharge Summary (Signed)
[name] [name], [name] [name] NO.:  [id number]   MEDICAL RECORD NO.:  [id number]          PATIENT TYPE:  INP   LOCATION:  2017                         FACILITY:  [facility]   PHYSICIAN:  Veverly Fells. Excell Seltzer, MD  DATE OF BIRTH:  01-28-1961   DATE OF ADMISSION:  03/05/2006  DATE OF DISCHARGE:  03/11/2006                               DISCHARGE SUMMARY   PROCEDURES:  1. Cardiac catheterization.  2. Coronary arteriogram.  3. Left ventriculogram.  4. Percutaneous transluminal coronary angioplasty and stent to the      left anterior descending with bare metal stents x3.  5. 2-D echocardiogram.   PRIMARY DIAGNOSIS:  Anterior myocardial infarction.   SECONDARY DIAGNOSES:  1. Ischemic cardiomyopathy with an ejection fraction of 35% to 45% and      akinesis of the mid distal anteroseptal wall/periapical wall/mid      distal inferior wall by echocardiogram.  2. History of tobacco use, cigars.  3. Hypertension.  4. Dyslipidemia with a total cholesterol of 166, triglycerides 62, HDL      41, LDL 113 this admission.   TIME AT DISCHARGE:  Forty-one minutes.   HOSPITAL COURSE:  Mr. Broad is a 50 year old male with no previous  history of coronary artery disease.  He had chest pain on the day of  admission and went to [facility].  His chest pain had started  at 5 p.m.  He had marked anterolateral ST elevation and he was treated  aggressively there and transferred to [facility] emergently for cardiac  catheterization.   His LAD was occluded with thrombus burden.  He had 3 bare metal stents  placed with downstream thrombus and TIMI 2 flow at conclusion.  He was  treated with Integrilin  and started on aspirin and Plavix.   A 2-D echocardiogram was performed which showed wall motion  abnormalities and left ventricular dysfunction as described above.  He  had no pericardial effusion.  He had residual chest pain and a rub was  noted on the exam that was felt to be a post  MI pericarditis.  He was  treated symptomatically and his condition improved.  By March 11, 2006,  the rub was no longer audible.   [name] was seen by cardiac rehabilitation and he is referred for  outpatient rehabilitation.  He is also seen by case management for  financial concerns.  All of his medications are available either at [facility] or [facility], except for the Plavix and he was given a 2-week card for  the Plavix to assist in obtaining this.  He is to only be on the Plavix  for 30 days at this time.   By March 11, 2006, [name] was ambulating without chest pain or  shortness of breath.  His blood pressure was well controlled on a low-  dose of Coreg and lisinopril.  He was evaluated by Dr. Excell Seltzer and  considered stable for discharge with outpatient followup arranged.   DISCHARGE INSTRUCTIONS:  His activity is to be increased gradually per  rehabilitation guidelines.  He is to call my office for  any problems  with the catheterization site.  He is to follow up with Dr. Excell Seltzer on  January 28 at 9:45.  He was encouraged to obtain a family physician.  He  is not to return to work until cleared by Dr. Excell Seltzer.   DISCHARGE MEDICATIONS:  1. Coreg 6.25 mg 1-1/2 tablets b.i.d.  2. Lisinopril 5 mg daily.  3. Plavix 75 mg daily.  4. Bupropion SR 150 mg 1 tablet daily for 3 days and then 1 tablet      b.i.d.  5. Nitroglycerin 0.4 mg p.r.n.  6. Simvastatin 80 mg daily p.m.      Theodore Demark, PA-C      Veverly Fells. Excell Seltzer, MD  Electronically Signed    RB/MEDQ  D:  03/11/2006  T:  03/11/2006  Job:  [id number]

## 2010-07-20 NOTE — Assessment & Plan Note (Signed)
[facility]                            CARDIOLOGY OFFICE NOTE   NAME:Dunnam, JARRAD MCLEES                   MRN:          [id number]  DATE:05/21/2006                            DOB:          06-May-1960    [name] [name] was seen as an outpatient at the [facility]  [facility] on May 21, 2006.  He is a 50 year old man who was hospitalized  in January with an anterior wall myocardial infarction.  He was treated  with stenting of the LAD but had a large thrombus burden at the distal  vessel and poor flow at the completion of the procedure.  His  hospitalization was complicated by post MI pericarditis.  He has  recovered well but has significant LV dysfunction with an LVEF in the  range of 35-40% following his MI.   Since his last visit here at the end of January, he was evaluated in the  emergency room for chest pain that was felt to be consistent with chest  wall pain.  He states that it felt like a pulled muscle.  He has been  doing well since that evaluation in mid February and has had no further  chest pain or shortness of breath.  He has had difficulty working  because he feels very tired after a day's work, and it takes him longer  to recover than previously.  He has no orthopnea, PND, or edema.  He has  no other complaints at this time.   CURRENT MEDICATIONS:  1. Lisinopril 5 mg daily.  2. Simvastatin 80 mg at bedtime.  3. Aspirin 325 mg daily.  4. Coreg 12.5 mg b.i.d.  5. Fish oil.   ALLERGIES:  No known drug allergies.   PHYSICAL EXAMINATION:  GENERAL:  The patient is alert and oriented.  He  is in no acute distress.  VITAL SIGNS:  Blood pressure is 116/75, heart rate 67, respiratory rate  12.  Weight is 180 pounds.  HEENT:  Normal.  NECK:  Normal carotid upstrokes without bruits.  Jugular venous pressure  is normal.  LUNGS: Clear to auscultation bilaterally.  HEART:  The apex is discrete and nondisplaced.  Heart is regular  rate  and rhythm without murmurs or gallops.  ABDOMEN:  Soft, nontender, no organomegaly.  EXTREMITIES:  No clubbing, cyanosis, or edema.  Peripheral pulses are  intact and equal.   ASSESSMENT:  1. Coronary artery disease and prior anterolateral myocardial      infarction.  Mr. Ancrum is doing relatively well and appears      stable from a cardiac standpoint.  He should continued on aspirin      and high-dose statin  therapy.  I would like him to increase his      lisinopril dose from 5 to 10 mg daily.  He should continue on Coreg      12.5 mg twice daily.  He had an echocardiogram this morning.  I am      awaiting those results to make sure that he does meet MADIT      criteria.  My expectation is that his LVEF  will be in the moderate      range.  I think with regard to his work, he is going to have a      difficult time ever returning to full-time work due to limitations      after his myocardial infarction.  He is considering quitting work      because of his fatigue and difficulty making it through a full day.  2. Dyslipidemia.  He is due for lipids and LFTs, and I plan on      following up with him after those are obtained her in the next few      weeks.  3. Post myocardial infarction pericarditis.  He has had no recurrence.  4. Disposition.  He was encouraged to continue with his exercise      program.  He has been walking and riding a stationary bike.  He is      up to walking one mile daily.  He will continue to try to increase      this.  I think overall he is doing relatively well, and I would      like to see him back in three months.  I will follow up with him by      telephone after the results of his echocardiogram, lipids, and LFTs      are available.  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  I will  plan on seeing Mr. Haithcock back in three months      in clinic or sooner if any problems arise.     Veverly Fells. Excell Seltzer, MD  Electronically Signed    MDC/MedQ  DD: 05/21/2006  DT: 05/21/2006  Job #: [id number]   cc:   Ramon Dredge L. Juanetta Gosling, M.D.

## 2010-07-20 NOTE — H&P (Signed)
[name], [name] NO.:  [id number]   MEDICAL RECORD NO.:  [id number]          PATIENT TYPE:  INP   LOCATION:  2305                         FACILITY:  [facility]   PHYSICIAN:  Veverly Fells. Excell Seltzer, MD  DATE OF BIRTH:  February 01, 1961   DATE OF ADMISSION:  03/05/2006  DATE OF DISCHARGE:                              HISTORY & PHYSICAL   CHIEF COMPLAINT:  Chest pain.   HISTORY OF PRESENT ILLNESS:  [name] [name] is a very nice 50 year old  African-American gentleman with no prior history of coronary artery  disease or medical problems who went to [facility] with acute  onset of chest pain, which started at 5 p.m. today.  His EKG  demonstrated marked anterolateral ST elevation consistent with injury  current and he was treated with 5000 units of heparin, nitroglycerin,  morphine, and aspirin, and was transferred here emergently for cardiac  catheterization.   The patient is in moderate distress and so his history that he provided  was brief.  He denies shortness of breath.  He did have 1 episode of  vomiting.  He is currently complaining of 7/10 chest pain.   PAST MEDICAL HISTORY:  Pertinent for:  1. No prior history of coronary artery disease.  2. He has a questionable history of hypertension.  3. He smokes cigarettes.  4. No history of diabetes, dyslipidemia.  5. He has not seen a doctor in many years.   PAST SURGICAL HISTORY:  None.   MEDICATIONS:  None.   ALLERGIES:  NKDA.   SOCIAL HISTORY:  The patient lives in [place] with his wife.  He  smokes cigars.  He drinks alcohol occasionally.  He works as a Ecologist.   FAMILY HISTORY:  There is no history of coronary artery disease in his  family.   REVIEW OF SYSTEMS:  A complete 12-point review of systems was performed.  Patient has had a recent nonproductive cough.  No other symptoms were  positive.   PHYSICAL EXAMINATION:  GENERAL:  The patient is alert and oriented, he  is in moderate  distress secondary to ongoing chest pain.  VITAL SIGNS:  Temperature 97.1, heart rate 88, respiratory rate 22,  blood pressure 109/61, oxygen saturations 100% on room air.  HEENT:  Normal.  NECK:  Normal.  Carotid upstrokes without bruits.  Jugular venous  pressure is normal.  No lymphadenopathy.  No thyromegaly or thyroid  nodules.  LUNGS:  Clear to auscultation bilaterally.  CARDIOVASCULAR:  The apex is discrete and nondisplaced.  The heart is  regular rate and rhythm with an S4 gallop, there are no murmurs present.  ABDOMEN:  Soft, nontender, no organomegaly, no abdominal bruits.  EXTREMITIES:  No clubbing, cyanosis, or edema.  Peripheral pulses are 2+  and equal throughout.  SKIN:  Warm and dry.  MUSCULOSKELETAL:  There is no joint deformities.  NEUROLOGIC:  Alert and oriented to all spheres.  Cranial nerves II-XII  are grossly intact.  EKG:  Demonstrates normal sinus rhythm with marked anterolateral as well  as inferior ST elevation.   LABORATORY TESTS:  Labs are currently pending.  ASSESSMENT:  This is a 50 year old gentleman with an acute anterolateral  ST elevation myocardial infarction.  His other problems include tobacco  abuse and question of hypertension.   PLAN:  In the setting of his ongoing chest pain and injury current, we  will perform emergency cardiac catheterization with planned primary PCI.  Risks and indications were reviewed in detail with the patient who is  agreeable.   He will require smoking cessation.  We will check lipids and plan on  starting a high-dose statin.  We will add in beta-blocker and ACE  inhibitor as he tolerates following his procedure.  Further plans  pending results of his catheterization.      Veverly Fells. Excell Seltzer, MD  Electronically Signed     MDC/MEDQ  D:  03/05/2006  T:  03/06/2006  Job:  [id number]

## 2010-07-20 NOTE — Assessment & Plan Note (Signed)
[facility]                            CARDIOLOGY OFFICE NOTE   NAME:Essner, NAZAIR FORTENBERRY                   MRN:          [id number]  DATE:03/31/2006                            DOB:          [birth year]    [name] [name] is seen in hospital followup after a recent  hospitalization from March 05, 2006 through March 11, 2006.  He  presented with an acute anterolateral myocardial infarction and  underwent PTCA and stenting of the mid and distal LAD.  The procedure  was complicated because of a large thrombus burden out in the distal  vessel and TIMI 1-2 flow at the completion of the procedure.  The lumen  of the mid and distal LAD was difficult to recannulize.  The patient had  a large infarct and had a prolonged elevation of his cardiac markers.  He also developed post myocardial infarction, pericarditis, and had a  prominent friction rub on examination for the first few days after his  myocardial infarction.   Since discharge home, Mr. Gigante has done quite well.  He has had  intermittent nonexertional chest pains that are out in the left lateral  chest and are sharp, stabbing pains that are fleeting in nature.  He has  not had  any further chest pressure or any symptoms similar to his prior  myocardial infarction.  He denies shortness of breath, nausea, vomiting,  orthopnea, PND, lightheadedness, syncope, or palpitations.  He otherwise  feels well.   CURRENT MEDICATIONS:  1. Coreg 6.25 mg, one and a half twice daily.  2. Lisinopril 5 mg daily.  3. Plavix 75 mg daily.  4. Simvastatin 80 mg at bedtime.  5. Aspirin 325 mg daily.   ALLERGIES:  NKDA.   PAST MEDICAL HISTORY:  Pertinent for the following:  1. Coronary artery disease.  As described above, he is status post      anterior myocardial infarction with a post MI ejection fraction in      the range of 35-45%, with akinesis of the periapical region as well      as the mid and distal  anterior wall.  2. History of tobacco abuse.  3. Hypertension.  4. Dyslipidemia.   PHYSICAL EXAMINATION:  GENERAL:  The patient is alert and oriented.  He  is in no acute distress.  VITAL SIGNS:  Weight is 176 pounds, blood pressure is 118, 68, heart  rate 77, respiratory rate is 12.  HEENT:  Normal.  NECK:  Normal carotid upstrokes without bruits.  Jugular venous pressure  is normal.  No thyromegaly or thyroid nodules.  LUNGS:  Clear to auscultation bilaterally.  CARDIOVASCULAR:  The apex is discrete and nondisplaced.  Heart is  regular rate and rhythm without murmurs or gallops.  ABDOMEN:  Soft, nontender.  No organomegaly.  No abdominal bruits.  EXTREMITIES:  No clubbing, cyanosis, or edema.  Peripheral pulses are 2+  and equal throughout.  The right groin site is clear without ecchymoses,  tenderness, or any hematoma.  NEUROLOGIC:  Grossly intact with 5/5 strength in the arms and legs  bilaterally.  Cranial nerves II-XII  are intact.   EKG demonstrates a normal sinus rhythm with an anterolateral myocardial  infarction as well as an inferior myocardial infarction pattern, both  age indeterminate.   ASSESSMENT:  Mr. Judice is currently stable from a cardiovascular  standpoint, following his recent anterior wall myocardial infarction.  His current cardiovascular problems are as follows:  1. Coronary artery disease, status post anterolateral myocardial      infarction.  The patient is doing well on his current medical      regimen which includes aspirin and clopidogrel.  He should continue      aspirin lifelong and clopidogrel can be discontinued after 30 days      of therapy.  He has cost constraints and it is acceptable to      discontinue this medication once this current prescription is      complete.  He is on a good post myocardial infarction medical      regimen with carvedilol and Lisinopril.  I have asked him to      increase his carvedilol dose to 12.5 mg twice  daily.  He was given      a prescription for this medication.  He is enrolled in cardiac      rehab and appears to be making good progress.  He is tolerating the      rehab activities without any symptoms as present.  I encouraged him      to continue in this regard.  Regarding his return to work, I      advised that he could return to work in one week.  This would give      him at least a few weeks of cardiac rehab, so that he could improve      stability there.  He has had no significant arrhythmias,      lightheaded spells, syncope, or chest pain and I feel it is safe      for him to return at this time.  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  2. Dyslipidemia.  Lipid panel in the hospital showed a cholesterol of      166, triglycerides 62, HDL 41, and LDL 113.  He was started on      simvastatin 80 mg at bedtime.  He should have followup lipids and      LFTs in approximately 8 weeks and I will follow up with him on the      results at the time of his return visit.  3. Post myocardial infarction pericarditis.  He exhibits no clinical      evidence of a friction rub, nor does he have any EKG evidence of      pericarditis at this point.  This appears to have resolved.  4. Tobacco cessation.  The patient has discontinued his use of cigars.      He was encouraged about his progress.   For followup, I will plan on seeing Mr. Minkin back in 6 weeks and  at that time I will check an echocardiogram to evaluate his left  ventricular function following his myocardial infarction.     Veverly Fells. Excell Seltzer, MD  Electronically Signed    MDC/MedQ  DD: 03/31/2006  DT:  03/31/2006  Job #: [id number]

## 2010-07-20 NOTE — Cardiovascular Report (Signed)
[name] [name], [name] [name] NO.:  [id number]   MEDICAL RECORD NO.:  [id number]          PATIENT TYPE:  INP   LOCATION:  2305                         FACILITY:  [facility]   PHYSICIAN:  [name] Fells. Excell Seltzer, MD  DATE OF BIRTH:  1960/03/14   DATE OF PROCEDURE:  03/05/2006  DATE OF DISCHARGE:                            CARDIAC CATHETERIZATION   PROCEDURE:  Left heart catheterization, selective coronary angiography,  PTCA and stenting of the LAD.   INDICATIONS:  [name] is a 50 year old African-American male who  developed acute substernal chest pain approximately 1 hour prior to  arrival here.  He was found to have anterior and inferior ST elevation  and was referred emergently for cardiac catheterization.  He has no past  medical history of coronary artery disease.   PROCEDURAL DETAILS:  Risks and indications of the procedure were  explained to the patient.  Informed consent was obtained.  Both groins  were prepped and draped under normal sterile conditions.  Using the  modified Seldinger technique a 7-French sheath was placed in the right  femoral artery.  Initial views of the right coronary artery were  attempted with a JL-4 catheter, but I was unable to engage the vessel.  I then moved onto the left coronary artery because of the patient's  massive injury current and ongoing chest pain.  A 7-French CLS coronary  guide catheter was inserted and angiographic views of the left coronary  artery were taken.  An initial ACT was drawn.  It was approximately 200.  Additional heparin was given and angiography demonstrated an occluded  mid LAD.  The other vessels appeared angiographically normal.  Integrilin was used for anticoagulation in addition to heparin.  A  cougar coronary guidewire was inserted and passed beyond the area of  occlusion, but it appeared to be in the territory of a diagonal.  Balloon inflations were performed with a 1.5 x 20 mm maverick balloon up  to 6  atmospheres.  Following balloon dilatation there was still no flow  in the territory where the distal LAD would be expected to course.  There was TIMI 2 flow in the diagonal branch.  At that point I took a  300 cm whisper wire and a 1.5 x 15 mm balloon down to that same area.  The cougar wire was left in the diagonal.  It  was very difficult to  wire but ultimately was able to pass a PT-2 wire through the area of  occlusion into the distal LAD after much difficulty.  A distal balloon  and dye injection was done and demonstrated that we were intraluminal  but there was a large thrombus burden in the distal LAD.  At that point  the 1.5 x 15 balloon was taken down and used to dilate the area up to 12  atmospheres on multiple inflations.  Following balloon dilatation there  was the appearance of dissection and thrombus throughout the mid and  distal vessel with TIMI 1.  A 2.0 x 20 mm balloon was then taken down  and inflated to 12 atmospheres on two subsequent inflations.  Following  balloon dilatation there was flow in the vessel but still appeared to be  a large thrombus burden distally.  At that point I elected to begin  stenting the vessel and a 2.5 x 28 mm Multi-Link Vision stent was  deployed at 9 atmospheres in the distal LAD.  Proximal to that a 2.75 x  28 mm Vision stent was deployed.  Following stent deployment there was  still poor flow and appeared to be thrombus and dissection just proximal  to the stent, I decided to place one more stent with a 3.0 x 12 mm Multi-  Link Vision which was deployed at 12 atmospheres.  Following stent  deployment there was TIMI 1-2 flow in the distal vessel with residual  thrombus.  Intracoronary adenosine was given and the wire was ultimately  removed.   At that point a 3D-RC catheter was used to engage the right coronary  artery and an angiographic image of that was taken.  A pigtail catheter  was then inserted into the left ventricle and pressures  were recorded, a  pullback across the aortic valve was done.  At the conclusion of the  case, the patient was transferred to the intensive care unit in guarded  condition.  He remained hemodynamically stable through the procedure.  He did have one long run of nonsustained ventricular tachycardia and was  treated with 100 mg of IV lidocaine.   FINDINGS:  Aortic pressure 105/75 with a mean of 90, left ventricular  pressure 117/9 with an end-diastolic pressure of 18.   The left mainstem is angiographically normal.  It trifurcates into the  LAD, left circumflex and ramus intermedius.   The LAD is a large diameter vessel that is extremely tortuous through  its proximal and midportion and is 100% occluded in the mid to distal  portion of the LAD.  There is the appearance of intraluminal thrombus.  There are two small diagonal branches from the LAD.   The ramus intermedius is a large diameter vessel and is angiographically  normal.   The left circumflex is a medium caliber vessel and has no significant  angiographic disease.  It gives off a very small first OM and a medium  caliber second OM.   The right coronary artery is dominant.  It is angiographically normal.  It terminates in a PDA and posterior AV segment which gives off one  posterolateral branch.   ASSESSMENT:  1. Acute anterior myocardial infarction secondary to left anterior      descending occlusion.  2. Normal right coronary artery, left circumflex and ramus      intermedius.  3. No left ventriculogram was performed secondary to a high volume of      contrast used.   PLAN:  We will continue therapy with Integrilin, aspirin and  clopidogrel.  We will add an ACE inhibitor and beta blocker as his blood  pressure tolerates.  There was TIMI 2 flow at the conclusion of the  procedure secondary to high residual thrombus burden.  Would recommend  lifelong dual antiplatelet therapy if he tolerates.  We will check an echo  tomorrow as we were unable to assess his left ventricular function  today because of the high volume of contrast.      [name] Fells. Excell Seltzer, MD  Electronically Signed     MDC/MEDQ  D:  03/05/2006  T:  03/06/2006  Job:  [id number]

## 2010-07-24 ENCOUNTER — Telehealth: Payer: Self-pay | Admitting: Cardiovascular Disease

## 2010-07-24 NOTE — Telephone Encounter (Addendum)
Pt wants to reschedule is procedure on 08/02/10 with dr allred. pt states he would like to change to June. Pt insurance comes active on June 1st.

## 2010-07-31 NOTE — Telephone Encounter (Signed)
Spoke with wife  Moved implant date to 08/07/10 and he will come get lab work on 08/03/10

## 2010-08-03 ENCOUNTER — Encounter: Payer: Self-pay | Admitting: *Deleted

## 2010-08-03 ENCOUNTER — Other Ambulatory Visit (INDEPENDENT_AMBULATORY_CARE_PROVIDER_SITE_OTHER): Payer: Self-pay | Admitting: *Deleted

## 2010-08-03 DIAGNOSIS — I5022 Chronic systolic (congestive) heart failure: Secondary | ICD-10-CM

## 2010-08-03 DIAGNOSIS — I10 Essential (primary) hypertension: Secondary | ICD-10-CM

## 2010-08-03 LAB — CBC WITH DIFFERENTIAL/PLATELET
Basophils Relative: 0.4 % (ref 0.0–3.0)
Eosinophils Absolute: 0.2 10*3/uL (ref 0.0–0.7)
Hemoglobin: 12.9 g/dL — ABNORMAL LOW (ref 13.0–17.0)
Lymphocytes Relative: 33.3 % (ref 12.0–46.0)
Lymphs Abs: 3.1 10*3/uL (ref 0.7–4.0)
MCHC: 33.4 g/dL (ref 30.0–36.0)
Monocytes Absolute: 1 10*3/uL (ref 0.1–1.0)
Monocytes Relative: 10.2 % (ref 3.0–12.0)
Neutro Abs: 5.1 10*3/uL (ref 1.4–7.7)
Neutrophils Relative %: 54.1 % (ref 43.0–77.0)
Platelets: 223 10*3/uL (ref 150.0–400.0)
WBC: 9.5 10*3/uL (ref 4.5–10.5)

## 2010-08-03 LAB — BASIC METABOLIC PANEL
CO2: 26 mEq/L (ref 19–32)
Creatinine, Ser: 0.9 mg/dL (ref 0.4–1.5)
GFR: 109 mL/min (ref >60.00)
Glucose, Bld: 102 mg/dL — ABNORMAL HIGH (ref 70–99)

## 2010-08-07 ENCOUNTER — Ambulatory Visit (HOSPITAL_COMMUNITY)
Admission: AD | Admit: 2010-08-07 | Discharge: 2010-08-08 | Disposition: A | Discharge status: Home / Self Care | Payer: BC Managed Care – HMO | Source: Ambulatory Visit | Attending: Internal Medicine | Admitting: Internal Medicine

## 2010-08-07 DIAGNOSIS — I2589 Other forms of chronic ischemic heart disease: Secondary | ICD-10-CM | POA: Insufficient documentation

## 2010-08-07 DIAGNOSIS — I2510 Atherosclerotic heart disease of native coronary artery without angina pectoris: Secondary | ICD-10-CM | POA: Insufficient documentation

## 2010-08-07 DIAGNOSIS — I5022 Chronic systolic (congestive) heart failure: Secondary | ICD-10-CM | POA: Insufficient documentation

## 2010-08-07 DIAGNOSIS — I509 Heart failure, unspecified: Secondary | ICD-10-CM | POA: Insufficient documentation

## 2010-08-07 DIAGNOSIS — K219 Gastro-esophageal reflux disease without esophagitis: Secondary | ICD-10-CM | POA: Insufficient documentation

## 2010-08-07 DIAGNOSIS — E785 Hyperlipidemia, unspecified: Secondary | ICD-10-CM | POA: Insufficient documentation

## 2010-08-07 HISTORY — PX: CARDIAC DEFIBRILLATOR PLACEMENT: SHX171

## 2010-08-08 ENCOUNTER — Ambulatory Visit (HOSPITAL_COMMUNITY): Payer: BC Managed Care – HMO

## 2010-08-13 NOTE — Discharge Summary (Signed)
[name] [name], [name] [name] NO.:  [id number]  MEDICAL RECORD NO.:  [id number]  LOCATION:  2023                         FACILITY:  [facility]  PHYSICIAN:  Hillis Range, MD       DATE OF BIRTH:  1960-08-31  DATE OF ADMISSION:  08/07/2010 DATE OF DISCHARGE:  08/08/2010                              DISCHARGE SUMMARY   PRIMARY CARE PHYSICIAN:  Ramon Dredge L. Juanetta Gosling, MD  PRIMARY CARDIOLOGIST:  Veverly Fells. Excell Seltzer, MD  ELECTROPHYSIOLOGIST:  Hillis Range, MD  PRIMARY DIAGNOSIS:  Ischemic cardiomyopathy.  SECONDARY DIAGNOSES: 1. Coronary artery disease, status post anterior myocardial infarction     in 2008, treated with bare-metal stents to the left anterior     descending artery. 2. Hyperlipidemia. 3. Hypertension. 4. Gastroesophageal reflux disease. 5. History of umbilical hernia.  ALLERGIES:  The patient has no known drug allergies.  PROCEDURES THIS ADMISSION: 1. Implantation of a single-chamber implantable cardiac defibrillator     on August 07, 2010 by Dr. Johney Frame.  The patient received a St. Jude     Medical model number 1241 ICD with model number [id number] right     ventricular lead.  The patient was enrolled in the Analyze ST     study.  DFTs at time of implant were successful at 20 joules.  The     patient had no early apparent complications.  BRIEF HISTORY OF PRESENT ILLNESS:  [name] [name] is a 50 year old male with a history of ischemic cardiomyopathy and coronary artery disease. His ejection fraction has remained depressed despite optimal medical therapy.  He was referred to Dr. Johney Frame in the outpatient setting for primary prevention of sudden death with an ICD.  Risks, benefits, and alternatives of this procedure were discussed with the patient.  He wished to proceed.  HOSPITAL COURSE:  The patient was admitted on August 07, 2010, for planned implantation of implantable cardiac defibrillator.  This was carried out by Dr. Johney Frame with details as outlined above.  The  patient was enrolled in the Analyze ST study.  The patient was monitored on telemetry, which demonstrated sinus rhythm.  His left chest was without hematoma or ecchymosis.  The chest x-ray was obtained, which demonstrated no pneumothorax, status post ICD implant.  ICD interrogation was performed and found to be functioning normally.  Dr. Johney Frame felt the patient was stable for discharge.  DISCHARGE INSTRUCTIONS: 1. Increase activity slowly. 2. No driving for 1 week. 3. Follow a low-sodium heart-healthy diet. 4. Keep incision clean and dry for 1 week. 5. See supplemental device discharge instructions for wound care and     arm mobility.  FOLLOWUP APPOINTMENTS: 1. [name] on August 16, 2010 at 4 p.m. 2. Dr. Johney Frame in September 2012, the office will call to schedule that     appointment. 3. Dr. Excell Seltzer as scheduled.  DISCHARGE MEDICATIONS: 1. Aspirin 325 mg daily. 2. Coreg 25 mg twice daily. 3. Fish oil daily. 4. Lisinopril 10 mg daily. 5. Multivitamin daily. 6. Simvastatin 80 mg daily at bedtime.  DISPOSITION:  The patient was seen and examined by Dr. Johney Frame on August 08, 2010, and considered stable for discharge.  DURATION OF DISCHARGE ENCOUNTER:  Thirty five  minutes.     Gypsy Balsam, RN,BSN   ______________________________ Hillis Range, MD    AS/MEDQ  D:  08/08/2010  T:  08/09/2010  Job:  [id number]  cc:   Veverly Fells. Excell Seltzer, MD Oneal Deputy. Juanetta Gosling, M.D.  Electronically Signed by Gypsy Balsam RNBSN on 08/10/2010 03:44:35 PM Electronically Signed by Hillis Range MD on 08/13/2010 09:20:27 AM

## 2010-08-13 NOTE — Op Note (Signed)
[name] [name], [name] [name] NO.:  [id number]  MEDICAL RECORD NO.:  [id number]  LOCATION:                                 FACILITY:  PHYSICIAN:  Hillis Range, MD       DATE OF BIRTH:  03-03-1961  DATE OF PROCEDURE: DATE OF DISCHARGE:                              OPERATIVE REPORT   SURGEON:  Hillis Range, MD  PREPROCEDURE DIAGNOSES: 1. Ischemic cardiomyopathy. 2. Chronic systolic left ventricular dysfunction. 3. Coronary artery disease. 4. New York Heart Association class 2/3 congestive heart failure.  POSTPROCEDURE DIAGNOSES: 1. Ischemic cardiomyopathy. 2. Chronic systolic left ventricular dysfunction. 3. Coronary artery disease. 4. New York Heart Association class 2/3 congestive heart failure.  PROCEDURE:  Implantable cardioverter-defibrillator implantation with defibrillation threshold testing.  INTRODUCTION:  [name] is a very pleasant 50 year old gentleman with a ischemic cardiomyopathy (ejection fraction 25%), New York Heart Association class 2/3 congestive heart failure, and coronary artery disease who presents for ICD implantation.  He meets MADIT II and SCD- HeFT criteria for ICD implantation for primary prevention of sudden cardiac death.  DESCRIPTION OF PROCEDURE:  Informed written consent was obtained and the patient was brought to the Electrophysiology Lab in the fasting state. He was adequately sedated with intravenous Versed and fentanyl as outlined in the nursing report.  The patient's left chest was prepped and draped in the usual sterile fashion by the EP lab staff.  The skin overlying the left deltopectoral region was infiltrated with lidocaine for local analgesia.  A 4-cm incision was made over the left deltopectoral region.  A left subcutaneous defibrillator pocket was fashioned using a combination of sharp and blunt dissection. Electrocautery was required to assure hemostasis.  The left axillary vein was cannulated with  fluoroscopic visualization.  No contrast was required for this endeavor.  Through the left axillary vein, a St. Jude Medical Curtis, model 1610R-60 (serial number [id number]) right ventricular defibrillator lead was advanced with fluoroscopic visualization into the right ventricular apex initially.  Mapping in this area revealed very small R-wave.  I therefore elected to place the lead along the mid right ventricular septum.  In this location, R-waves measured 8.3 mV with an impedance of 833 ohms and a threshold of 0.8 volts at 0.5 milliseconds.  The lead was therefore secured to the pectoralis fascia using #2 silk suture over the suture sleeve.  The lead was then connected to a [address] Jude Medical Fortify ST VR model AV4098-11B (serial number [id number]) ICD.  The pocket was irrigated with copious gentamicin solution.  The defibrillator was then placed into the pocket. The pocket was then closed in two layers with 2.0 Vicryl suture for the subcutaneous and subcuticular layers.  Steri-Strips and a sterile dressing were then applied.  Ventricular defibrillation threshold testing was then performed.  Ventricular fibrillation was induced with a T-wave shock.  Adequate sensing of ventricular fibrillation was observed with no dropout with a programed sensitivity of 1.0 mV.  An initial 15- joule shock was unsuccessful in defibrillating the patient.  A 25 joules shock was then delivered from the device which successfully defibrillated the patient to sinus rhythm.  He was allowed to recover for a 5-minute period.  After a 5-minute waiting, ventricular fibrillation was again induced with a T-wave shock.  Adequate sensing of ventricular fibrillation was again observed and a 20-joule shock was delivered from the device which successfully defibrillated the patient with an impedance of 64 ohms and a duration of 7 seconds.  The patient remained in sinus rhythm thereafter.  There were no early  apparent complications.  CONCLUSIONS: 1. Successful implantation of a St. Jude Medical Fortify ST VR ICD for     primary prevention of sudden cardiac death according to MADIT II     AND SCD-HeFT criteria. 2. DFT 20 joules. 3. No early apparent complications.     Hillis Range, MD     JA/MEDQ  D:  08/07/2010  T:  08/07/2010  Job:  [id number]  cc:   Veverly Fells. Excell Seltzer, MD  Electronically Signed by Hillis Range MD on 08/13/2010 09:20:25 AM

## 2010-08-16 ENCOUNTER — Ambulatory Visit: Payer: Self-pay | Admitting: *Deleted

## 2010-08-23 ENCOUNTER — Ambulatory Visit: Payer: Self-pay | Admitting: *Deleted

## 2010-08-28 ENCOUNTER — Other Ambulatory Visit: Payer: Self-pay | Admitting: Internal Medicine

## 2010-08-28 ENCOUNTER — Ambulatory Visit (INDEPENDENT_AMBULATORY_CARE_PROVIDER_SITE_OTHER): Payer: Self-pay | Admitting: *Deleted

## 2010-08-28 ENCOUNTER — Encounter (INDEPENDENT_AMBULATORY_CARE_PROVIDER_SITE_OTHER): Payer: Self-pay

## 2010-08-28 DIAGNOSIS — I2589 Other forms of chronic ischemic heart disease: Secondary | ICD-10-CM

## 2010-08-28 DIAGNOSIS — R0989 Other specified symptoms and signs involving the circulatory and respiratory systems: Secondary | ICD-10-CM

## 2010-11-22 LAB — CBC
HCT: 39.7
MCV: 88.9
Platelets: 220
RDW: 13.2
WBC: 8.4

## 2010-11-22 LAB — COMPREHENSIVE METABOLIC PANEL
Albumin: 4
BUN: 13
Chloride: 104
Creatinine, Ser: 0.87
GFR calc non Af Amer: >60
Total Bilirubin: 0.4

## 2010-11-22 LAB — DIFFERENTIAL
Basophils Absolute: 0
Lymphocytes Relative: 34
Monocytes Absolute: 0.7
Neutro Abs: 4.6
Neutrophils Relative %: 55

## 2010-11-22 LAB — URINALYSIS, ROUTINE W REFLEX MICROSCOPIC
Leukocytes, UA: NEGATIVE
Nitrite: NEGATIVE
Specific Gravity, Urine: 1.025
pH: 6

## 2010-11-28 ENCOUNTER — Encounter: Payer: Self-pay | Admitting: Internal Medicine

## 2010-11-28 ENCOUNTER — Ambulatory Visit (INDEPENDENT_AMBULATORY_CARE_PROVIDER_SITE_OTHER): Payer: Self-pay | Admitting: Internal Medicine

## 2010-11-28 DIAGNOSIS — I5022 Chronic systolic (congestive) heart failure: Secondary | ICD-10-CM

## 2010-11-28 DIAGNOSIS — I428 Other cardiomyopathies: Secondary | ICD-10-CM

## 2010-11-28 DIAGNOSIS — I2589 Other forms of chronic ischemic heart disease: Secondary | ICD-10-CM

## 2010-11-28 DIAGNOSIS — I10 Essential (primary) hypertension: Secondary | ICD-10-CM

## 2010-11-28 LAB — ICD DEVICE OBSERVATION
BRDY-0002RV: 40 {beats}/min
DEVICE MODEL ICD: 809738
FVT: 0
PACEART VT: 0
TOT-0007: 2
TOT-0008: 0
TZON-0003SLOWVT: 335 ms
TZON-0004SLOWVT: 30
VF: 0

## 2010-11-28 NOTE — Progress Notes (Signed)
The patient presents today for routine electrophysiology followup.  Since last being seen in our clinic, the patient reports doing very well.  Today, he denies symptoms of palpitations, chest pain, shortness of breath, orthopnea, PND, lower extremity edema, dizziness, presyncope, syncope, or neurologic sequela.  He reports fatigue when working.  The patient feels that he is tolerating medications without difficulties and is otherwise without complaint today.   Past Medical History  Diagnosis Date  . CAD (coronary artery disease)     anterior MI 2008, treated with bare metal stent to LAD  . MI (myocardial infarction)   . Ischemic cardiomyopathy     NYHA Class II/III CHF  . 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 HLD (hyperlipidemia)   . HTN (hypertension)   . GERD (gastroesophageal reflux disease)   . Umbilical hernia    Past Surgical History  Procedure Date  . Finger surgery 2/09    laceration repair  . Cardiac defibrillator placement 08/07/10    SJM Fortify VR ST implanted by Dr Johney Frame    Current Outpatient Prescriptions  Medication Sig Dispense Refill  . aspirin 325 MG tablet Take 325 mg by mouth daily.        . carvedilol (COREG) 25 MG tablet Take 25 mg by mouth 2 (two) times daily with a meal.        . fish oil-omega-3 fatty acids 1000 MG capsule Take 1 g by mouth daily.       Marland Kitchen lisinopril (PRINIVIL,ZESTRIL) 10 MG tablet Take 10 mg by mouth daily.        . Multiple Vitamin (MULTIVITAMIN) tablet Take 1 tablet by mouth daily.        . nitroGLYCERIN (NITROSTAT) 0.4 MG SL tablet Place 0.4 mg under the tongue every 5 (five) minutes as needed.        . simvastatin (ZOCOR) 80 MG tablet Take 80 mg by mouth at bedtime.          No Known Allergies  History   Social History  . Marital Status: Married    Spouse Name: N/A    Number of Children: N/A  . Years of Education: N/A   Occupational History  . truck driver    Social History Main Topics  . Smoking status: Former Smoker     Types: Cigars    Quit date: 03/04/2006  . Smokeless tobacco: Not on file  . Alcohol Use: Yes     occasionally  . Drug Use: No  . Sexually Active: Not on file   Other Topics Concern  . Not on file   Social History Narrative   Lives in [place] with spouse.  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    Family History  Problem Relation Age of Onset  . Heart failure    . Diabetes      ROS-  All systems are reviewed and are negative except as outlined in the HPI above   Physical Exam: Filed Vitals:   11/28/10 0909  BP: 153/98  Pulse: 77  Height: 5\' 10"  (1.778 m)  Weight: 238 lb (107.956 kg)    GEN- The patient is well appearing, alert and oriented x 3 today.   Head- normocephalic, atraumatic Eyes-  Sclera clear, conjunctiva pink Ears- hearing intact Oropharynx- clear Neck- supple, no JVP Lymph- no cervical lymphadenopathy Lungs- Clear to ausculation bilaterally, normal work of breathing Chest- ICD pocket is well healed Heart- Regular rate and  rhythm, no murmurs, rubs or gallops, PMI not laterally displaced GI- soft, NT, ND, + BS Extremities- no clubbing, cyanosis, or edema MS- no significant deformity or atrophy Skin- no rash or lesion Psych- euthymic mood, full affect Neuro- strength and sensation are intact  ICD interrogation- reviewed in detail today,  See [facility] report EKG today reveals sinus rhythm 74 bpm, anterior infaction, precordial TWI  Assessment and Plan:

## 2010-11-28 NOTE — Assessment & Plan Note (Signed)
Stable No change required today  

## 2010-11-28 NOTE — Assessment & Plan Note (Signed)
No ischemic symptoms Normal ICD function See [facility] report No changes today  

## 2010-11-28 NOTE — Assessment & Plan Note (Signed)
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  He is filing for disability.

## 2010-11-28 NOTE — Patient Instructions (Signed)
Your physician wants you to follow-up in: 6 months in the device clinic and in June 2012 with Dr Johney Frame Bonita Quin will receive a reminder letter in the mail two months in advance. If you don't receive a letter, please call our office to schedule the follow-up appointment.  Remote monitoring is used to monitor your Pacemaker of ICD from home. This monitoring reduces the number of office visits required to check your device to one time per year. It allows [place] to keep an eye on the functioning of your device to ensure it is working properly. You are scheduled for a device check from home on 02/28/2011. You may send your transmission at any time that day. If you have a wireless device, the transmission will be sent automatically. After your physician reviews your transmission, you will receive a postcard with your next transmission date.

## 2010-12-05 ENCOUNTER — Ambulatory Visit: Payer: Self-pay | Admitting: Cardiovascular Disease

## 2010-12-12 ENCOUNTER — Other Ambulatory Visit: Payer: Self-pay | Admitting: *Deleted

## 2010-12-12 MED ORDER — SIMVASTATIN 80 MG PO TABS: 80.0000 mg | ORAL_TABLET | Freq: Every day | ORAL | Status: DC

## 2010-12-17 LAB — URINALYSIS, ROUTINE W REFLEX MICROSCOPIC
Bilirubin Urine: NEGATIVE
Glucose, UA: NEGATIVE
Ketones, ur: NEGATIVE
Specific Gravity, Urine: 1.02
pH: 6

## 2011-02-28 ENCOUNTER — Encounter: Payer: Self-pay | Admitting: *Deleted

## 2011-03-02 ENCOUNTER — Encounter: Payer: Self-pay | Admitting: *Deleted

## 2011-05-27 ENCOUNTER — Encounter: Payer: Self-pay | Admitting: Internal Medicine

## 2011-05-27 ENCOUNTER — Ambulatory Visit (INDEPENDENT_AMBULATORY_CARE_PROVIDER_SITE_OTHER): Payer: Self-pay | Admitting: *Deleted

## 2011-05-27 DIAGNOSIS — I2589 Other forms of chronic ischemic heart disease: Secondary | ICD-10-CM

## 2011-05-27 DIAGNOSIS — I5022 Chronic systolic (congestive) heart failure: Secondary | ICD-10-CM

## 2011-05-27 LAB — ICD DEVICE OBSERVATION
DEV-0020ICD: NEGATIVE
DEVICE MODEL ICD: 809738
HV IMPEDENCE: 74 Ohm
PACEART VT: 0
TOT-0008: 0
TOT-0009: 1
TOT-0010: 6
TZON-0003SLOWVT: 335 ms
TZON-0004SLOWVT: 30
TZON-0005SLOWVT: 6
VENTRICULAR PACING ICD: 0 pct

## 2011-05-27 NOTE — Progress Notes (Signed)
ICD check done by industry for research

## 2011-08-28 ENCOUNTER — Ambulatory Visit (INDEPENDENT_AMBULATORY_CARE_PROVIDER_SITE_OTHER): Payer: Self-pay | Admitting: Internal Medicine

## 2011-08-28 ENCOUNTER — Encounter: Payer: Self-pay | Admitting: Internal Medicine

## 2011-08-28 VITALS — BP 119/78 | Resp 18 | Ht 70.0 in | Wt 230.0 lb

## 2011-08-28 DIAGNOSIS — I5022 Chronic systolic (congestive) heart failure: Secondary | ICD-10-CM

## 2011-08-28 DIAGNOSIS — I2589 Other forms of chronic ischemic heart disease: Secondary | ICD-10-CM

## 2011-08-28 LAB — ICD DEVICE OBSERVATION
FVT: 0
HV IMPEDENCE: 78 Ohm
PACEART VT: 0
RV LEAD AMPLITUDE: 12 mv
RV LEAD IMPEDENCE ICD: 437.5 Ohm
RV LEAD THRESHOLD: 0.75 V
TOT-0008: 0
TZON-0004SLOWVT: 30
TZON-0010SLOWVT: 40 ms
VF: 0

## 2011-08-28 MED ORDER — NITROGLYCERIN 0.4 MG SL SUBL: 0.4000 mg | SUBLINGUAL_TABLET | SUBLINGUAL | Status: DC | PRN

## 2011-08-28 NOTE — Assessment & Plan Note (Signed)
euvolemic today No changes  Normal ICD function See Pace Art report No changes today  Merlin checks every 3 months Return to see me in 12 months  I have spoken with Laymond Purser with research foundation.  They will contact patient to make arrangements for follow-up

## 2011-08-28 NOTE — Progress Notes (Signed)
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  Since last being seen in our clinic, the patient reports doing very well.  Today, he denies symptoms of palpitations, chest pain, shortness of breath,  lower extremity edema, dizziness, presyncope, syncope, or ICD shocks.  The patient is otherwise without complaint today.   Past Medical History  Diagnosis Date  . CAD (coronary artery disease)     anterior MI 2008, treated with bare metal stent to LAD  . MI (myocardial infarction)   . Ischemic cardiomyopathy     NYHA Class II/III CHF  . 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 HLD (hyperlipidemia)   . HTN (hypertension)   . GERD (gastroesophageal reflux disease)   . Umbilical hernia    Past Surgical History  Procedure Date  . Finger surgery 2/09    laceration repair  . Cardiac defibrillator placement 08/07/10    SJM Fortify VR ST implanted by Dr Johney Frame, part of the Analyze ST ICD study    Current Outpatient Prescriptions  Medication Sig Dispense Refill  . aspirin 325 MG tablet Take 325 mg by mouth daily.        . carvedilol (COREG) 25 MG tablet Take 25 mg by mouth 2 (two) times daily with a meal.        . fish oil-omega-3 fatty acids 1000 MG capsule Take 1 g by mouth daily.       Marland Kitchen lisinopril (PRINIVIL,ZESTRIL) 10 MG tablet Take 10 mg by mouth daily.        . Multiple Vitamin (MULTIVITAMIN) tablet Take 1 tablet by mouth daily.        . nitroGLYCERIN (NITROSTAT) 0.4 MG SL tablet Place 0.4 mg under the tongue every 5 (five) minutes as needed.        . simvastatin (ZOCOR) 80 MG tablet Take 1 tablet (80 mg total) by mouth at bedtime.  30 tablet  6    Physical Exam: Filed Vitals:   08/28/11 0853  BP: 119/78  Resp: 18  Height: 5\' 10"  (1.778 m)  Weight: 230 lb (104.327 kg)    GEN- The patient is well appearing, alert and oriented x 3 today.   Head- normocephalic,  atraumatic Eyes-  Sclera clear, conjunctiva pink Ears- hearing intact Oropharynx- clear Lungs- Clear to ausculation bilaterally, normal work of breathing Chest- ICD pocket is well healed Heart- Regular rate and rhythm, no murmurs, rubs or gallops, PMI not laterally displaced GI- soft, NT, ND, + BS Extremities- no clubbing, cyanosis, or edema  ICD interrogation- reviewed in detail today,  See PACEART report  Assessment and Plan:

## 2011-08-28 NOTE — Patient Instructions (Addendum)
Your physician wants you to follow-up in: 12 months with Dr Jacquiline Doe will receive a reminder letter in the mail two months in advance. If you don't receive a letter, please call our office to schedule the follow-up appointment.   Remote monitoring is used to monitor your Pacemaker of ICD from home. This monitoring reduces the number of office visits required to check your device to one time per year. It allows [place] to keep an eye on the functioning of your device to ensure it is working properly. You are scheduled for a device check from home on 11/25/11. You may send your transmission at any time that day. If you have a wireless device, the transmission will be sent automatically. After your physician reviews your transmission, you will receive a postcard with your next transmission date.  Your physician recommends that you schedule a follow-up appointment in: next available with Dr Excell Seltzer who is his primary cardiologist

## 2011-10-10 ENCOUNTER — Ambulatory Visit: Payer: Self-pay | Admitting: Internal Medicine

## 2011-10-14 ENCOUNTER — Telehealth: Payer: Self-pay | Admitting: Cardiovascular Disease

## 2011-10-14 NOTE — Telephone Encounter (Signed)
Pt ask is anyway possible he can get paperwork by Friday because he has an appt Friday and if he has to cancel it will take another 4-6 months for another appt. He also needs to now since health port did not fill out paperwork will he gets his $25 back.

## 2011-10-16 NOTE — Telephone Encounter (Signed)
Dr Excell Seltzer reviewed the pt's paperwork and due to the extensive amount of information that needs to be completed this will have to be done during an office visit.  I also spoke with [facility] and the pt's debit information was shredded and he was not charged the $25. I spoke with the pt and made him aware of this information.  I made the pt aware that he needs to keep his scheduled appointment on 10/29/11 with Dr Excell Seltzer and we will complete forms at that time. Pt agreed with plan.

## 2011-10-29 ENCOUNTER — Ambulatory Visit (INDEPENDENT_AMBULATORY_CARE_PROVIDER_SITE_OTHER): Payer: Self-pay | Admitting: Cardiovascular Disease

## 2011-10-29 ENCOUNTER — Encounter: Payer: Self-pay | Admitting: Cardiovascular Disease

## 2011-10-29 VITALS — BP 131/85 | HR 68 | Ht 70.0 in | Wt 211.4 lb

## 2011-10-29 DIAGNOSIS — I2510 Atherosclerotic heart disease of native coronary artery without angina pectoris: Secondary | ICD-10-CM

## 2011-10-29 DIAGNOSIS — I10 Essential (primary) hypertension: Secondary | ICD-10-CM

## 2011-10-29 DIAGNOSIS — I5022 Chronic systolic (congestive) heart failure: Secondary | ICD-10-CM

## 2011-10-29 DIAGNOSIS — E785 Hyperlipidemia, unspecified: Secondary | ICD-10-CM

## 2011-10-29 NOTE — Assessment & Plan Note (Signed)
The patient's lipids were reviewed. His last lipid panel was in March 2012. He has tolerated high dose simvastatin for many years so I did not make a dose reduction. His LFTs have been within normal limits. His lipids are followed through the [facility]

## 2011-10-29 NOTE — Assessment & Plan Note (Signed)
Well-controlled on his current medical program. He reports home blood pressures in the range of 110-120 systolic.

## 2011-10-29 NOTE — Patient Instructions (Addendum)
Your physician wants you to follow-up in: 6 MONTHS with Dr [name].  You will receive a reminder letter in the mail two months in advance. If you don't receive a letter, please call our office to schedule the follow-up appointment.  Your physician recommends that you continue on your current medications as directed. Please refer to the Current Medication list given to you today.  

## 2011-10-29 NOTE — Assessment & Plan Note (Signed)
The patient is stable without significant anginal symptoms. He does have occasional chest tightness with exertion. This is not lifestyle limiting. Recommend continue his current medical program at this time.

## 2011-10-29 NOTE — Progress Notes (Signed)
   HPI:  [name] [name] presents for followup evaluation. He is a 51 year old gentleman with coronary artery disease and ischemic cardiomyopathy. He initially presented in 2008 with an anterior wall MI and was treated with bare-metal stenting of the LAD. He's had a severe ischemic cardiomyopathy with left ventricular ejection fraction less than 30%. He underwent ICD implantation in June 2012. He presents today for followup evaluation.  The patient has previously worked as a Naval architect. He has been denied disability, despite his significant cardiac problems. He is working on disability benefits through the [employer]. He comes in today for scheduled followup.  From a symptomatically spent, he is doing relatively well at low level activities. He has no resting chest pain, shortness of breath, palpitations, edema, lightheadedness, or syncope. He is able to do his activities of daily living without limitation. However, he remains limited with moderate level activity. He is able to walk on the treadmill at a slow pace for about 15 minutes. When he gets up to 3.5 miles per hour, he is limited by dyspnea and occasional chest tightness. He is not able to do things like use a push mower or a weedeater without frequent rest breaks. He reports no ICD discharges. He's had no sustained palpitations. He's been compliant with his medical program. He continues to work on his diet and exercise program.    Outpatient Encounter Prescriptions as of 10/29/2011  Medication Sig Dispense Refill  . aspirin 325 MG tablet Take 325 mg by mouth daily.        . carvedilol (COREG) 25 MG tablet Take 25 mg by mouth 2 (two) times daily with a meal.        . fish oil-omega-3 fatty acids 1000 MG capsule Take 1 g by mouth daily.       Marland Kitchen lisinopril (PRINIVIL,ZESTRIL) 10 MG tablet Take 10 mg by mouth daily.        . Multiple Vitamin (MULTIVITAMIN) tablet Take 1 tablet by mouth daily.        . nitroGLYCERIN (NITROSTAT) 0.4 MG  SL tablet Place 1 tablet (0.4 mg total) under the tongue every 5 (five) minutes as needed.  25 tablet  1  . simvastatin (ZOCOR) 80 MG tablet Take 1 tablet (80 mg total) by mouth at bedtime.  30 tablet  6    No Known Allergies  Past Medical History  Diagnosis Date  . CAD (coronary artery disease)     anterior MI 2008, treated with bare metal stent to LAD  . MI (myocardial infarction)   . Ischemic cardiomyopathy     NYHA Class II/III CHF  . 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 HLD (hyperlipidemia)   . HTN (hypertension)   . GERD (gastroesophageal reflux disease)   . Umbilical hernia     ROS: Positive for generalized fatigue, bilateral knee pain, but otherwise negative except as per HPI  There were no vitals taken for this visit.  PHYSICAL EXAM: Pt is alert and oriented, NAD HEENT: normal Neck: JVP - normal, carotids 2+= without bruits Lungs: CTA bilaterally CV: RRR without murmur or gallop Abd: soft, NT, Positive BS, no hepatomegaly Ext: no C/C/E, distal pulses intact and equal Skin: warm/dry no rash  EKG:  Normal sinus rhythm 65 beats per minute, age-indeterminate anterolateral infarct, age indeterminate inferior infarct.  ASSESSMENT AND PLAN:

## 2011-10-29 NOTE — Assessment & Plan Note (Signed)
The patient has New York Heart Association class 2-3 symptoms. He is on a good medical program that includes goal doses of carvedilol and lisinopril. He reports no ICD discharges. I encouraged him to continue to slowly work on his exercise tolerance with increasing frequency and intensity of his exercise program as tolerated. The patient's last echocardiogram in 2012 demonstrating a left ventricular ejection fraction of 25-30% with a severely dilated left ventricle.

## 2011-11-15 ENCOUNTER — Encounter: Payer: Self-pay | Admitting: *Deleted

## 2011-11-15 DIAGNOSIS — Z95810 Presence of automatic (implantable) cardiac defibrillator: Secondary | ICD-10-CM | POA: Insufficient documentation

## 2011-11-25 ENCOUNTER — Ambulatory Visit (INDEPENDENT_AMBULATORY_CARE_PROVIDER_SITE_OTHER): Payer: Self-pay | Admitting: *Deleted

## 2011-11-25 ENCOUNTER — Encounter: Payer: Self-pay | Admitting: Internal Medicine

## 2011-11-25 DIAGNOSIS — I428 Other cardiomyopathies: Secondary | ICD-10-CM

## 2011-11-25 LAB — ICD DEVICE OBSERVATION
BRDY-0002RV: 40 {beats}/min
DEV-0020ICD: NEGATIVE
DEVICE MODEL ICD: 809738
HV IMPEDENCE: 77 Ohm
PACEART VT: 0
TOT-0007: 2
TOT-0009: 1
TZON-0003SLOWVT: 335 ms
VENTRICULAR PACING ICD: 0 pct
VF: 0

## 2011-11-25 NOTE — Progress Notes (Signed)
defib check in clinic  

## 2011-12-20 ENCOUNTER — Other Ambulatory Visit: Payer: Self-pay | Admitting: Cardiovascular Disease

## 2012-02-28 ENCOUNTER — Encounter: Payer: Self-pay | Admitting: Cardiovascular Disease

## 2012-03-02 ENCOUNTER — Ambulatory Visit (INDEPENDENT_AMBULATORY_CARE_PROVIDER_SITE_OTHER): Payer: Self-pay | Admitting: *Deleted

## 2012-03-02 DIAGNOSIS — Z95810 Presence of automatic (implantable) cardiac defibrillator: Secondary | ICD-10-CM

## 2012-03-05 LAB — REMOTE ICD DEVICE
DEVICE MODEL ICD: 809738
RV LEAD IMPEDENCE ICD: 390 Ohm
TZON-0003SLOWVT: 335 ms
TZON-0004SLOWVT: 30

## 2012-03-10 ENCOUNTER — Encounter: Payer: Self-pay | Admitting: *Deleted

## 2012-03-12 ENCOUNTER — Encounter: Payer: Self-pay | Admitting: Internal Medicine

## 2012-05-01 ENCOUNTER — Encounter: Payer: Self-pay | Admitting: Cardiovascular Disease

## 2012-05-01 ENCOUNTER — Other Ambulatory Visit: Payer: Self-pay | Admitting: Internal Medicine

## 2012-05-01 ENCOUNTER — Encounter: Payer: Self-pay | Admitting: Internal Medicine

## 2012-05-01 ENCOUNTER — Encounter (INDEPENDENT_AMBULATORY_CARE_PROVIDER_SITE_OTHER): Payer: Self-pay

## 2012-05-01 ENCOUNTER — Ambulatory Visit (INDEPENDENT_AMBULATORY_CARE_PROVIDER_SITE_OTHER): Payer: Self-pay | Admitting: Cardiovascular Disease

## 2012-05-01 ENCOUNTER — Ambulatory Visit (INDEPENDENT_AMBULATORY_CARE_PROVIDER_SITE_OTHER): Payer: Self-pay | Admitting: *Deleted

## 2012-05-01 VITALS — BP 130/88 | HR 71 | Ht 70.0 in | Wt 221.0 lb

## 2012-05-01 DIAGNOSIS — I2510 Atherosclerotic heart disease of native coronary artery without angina pectoris: Secondary | ICD-10-CM

## 2012-05-01 DIAGNOSIS — R0989 Other specified symptoms and signs involving the circulatory and respiratory systems: Secondary | ICD-10-CM

## 2012-05-01 DIAGNOSIS — I5022 Chronic systolic (congestive) heart failure: Secondary | ICD-10-CM

## 2012-05-01 DIAGNOSIS — I2589 Other forms of chronic ischemic heart disease: Secondary | ICD-10-CM

## 2012-05-01 LAB — ICD DEVICE OBSERVATION
BRDY-0002RV: 40 {beats}/min
DEV-0020ICD: NEGATIVE
HV IMPEDENCE: 73 Ohm
RV LEAD IMPEDENCE ICD: 400 Ohm
TZON-0003SLOWVT: 335 ms
TZON-0004SLOWVT: 30
TZON-0005SLOWVT: 6
TZON-0010SLOWVT: 40 ms
VENTRICULAR PACING ICD: 1 pct

## 2012-05-01 MED ORDER — SPIRONOLACTONE 25 MG PO TABS: 12.5000 mg | ORAL_TABLET | Freq: Every day | ORAL | Status: DC

## 2012-05-01 NOTE — Patient Instructions (Addendum)
Your physician has recommended you make the following change in your medication: START Aldactone 12.5mg  by mouth daily  Your physician recommends that you return for lab work in: 2 WEEKS (BMP)  Your physician wants you to follow-up in: 6 MONTHS with Dr Excell Seltzer.  You will receive a reminder letter in the mail two months in advance. If you don't receive a letter, please call our office to schedule the follow-up appointment.  Please bring a copy of your labs from the [place] into the office when you pick-up paperwork next week.

## 2012-05-01 NOTE — Progress Notes (Signed)
ICD check for research for industry

## 2012-05-01 NOTE — Progress Notes (Signed)
HPI:  52 year old male presenting for followup of coronary artery disease and ischemic cardiomyopathy. The patient had anterior wall MI in 2008 it was treated with bare-metal stenting of the LAD. He's had severe residual LV dysfunction with an ejection fraction less than 30%. He underwent ICD implantation in 2012. He said New York Heart Association class 2-3 congestive heart failure.  Overall the patient is stable. He remains modestly limited by chronic exertional dyspnea. He denies orthopnea, PND, or leg edema. He's had no chest pain or pressure and has not required nitroglycerin since last seen him. He has not been engaged in routine exercise of late.  Outpatient Encounter Prescriptions as of 05/01/2012  Medication Sig Dispense Refill  . aspirin 325 MG tablet Take 325 mg by mouth daily.        . carvedilol (COREG) 25 MG tablet Take 25 mg by mouth 2 (two) times daily with a meal.        . cholecalciferol (VITAMIN D) 1000 UNITS tablet Take 1,000 Units by mouth daily.      . fish oil-omega-3 fatty acids 1000 MG capsule Take 1 g by mouth daily.       Marland Kitchen lisinopril (PRINIVIL,ZESTRIL) 10 MG tablet Take 10 mg by mouth daily.        . Multiple Vitamin (MULTIVITAMIN) tablet Take 1 tablet by mouth daily.        . nitroGLYCERIN (NITROSTAT) 0.4 MG SL tablet Place 1 tablet (0.4 mg total) under the tongue every 5 (five) minutes as needed.  25 tablet  1  . simvastatin (ZOCOR) 80 MG tablet TAKE 1 TABLET (80 MG TOTAL) BY MOUTH AT BEDTIME.  90 tablet  3   No facility-administered encounter medications on file as of 05/01/2012.    No Known Allergies  Past Medical History  Diagnosis Date  . CAD (coronary artery disease)     anterior MI 2008, treated with bare metal stent to LAD  . MI (myocardial infarction)   . Ischemic cardiomyopathy     NYHA Class II/III CHF  . 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 HLD (hyperlipidemia)   . HTN (hypertension)   . GERD (gastroesophageal reflux disease)   .  Umbilical hernia    ROS: Positive for generalized fatigue, otherwise negative except as per HPI  BP 130/88  Pulse 71  Ht 5\' 10"  (1.778 m)  Wt 100.245 kg (221 lb)  BMI 31.71 kg/m2  SpO2 96%  PHYSICAL EXAM: Pt is alert and oriented, NAD HEENT: normal Neck: JVP - normal, carotids 2+= without bruits Lungs: CTA bilaterally CV: RRR without murmur or gallop Abd: soft, NT, Positive BS, no hepatomegaly Ext: no C/C/E, distal pulses intact and equal Skin: warm/dry no rash  EKG:  Normal sinus rhythm 71 beats per minute, anterolateral infarct age undetermined.  2-D echo 06/25/2010: Study Conclusions  - Left ventricle: Inferoapical,apical,spetal and distal anterior wall akinesis. Inferobasal hypokinesis The cavity size was severely dilated. Wall thickness was increased in a pattern of mild LVH. Systolic function was severely reduced. The estimated ejection fraction was in the range of 25% to 30%. - Mitral valve: Mild regurgitation. - Left atrium: The atrium was mildly dilated. - Atrial septum: No defect or patent foramen ovale was identified.  ASSESSMENT AND PLAN: 1. Chronic systolic heart failure, New York Heart Association class 2-3. I have recommended that the patient start Aldactone 12.5 mg daily as an addition to his medical program which are to include skull dose carvedilol and lisinopril.  He should have a metabolic panel checked in 2 weeks to evaluate for hyperkalemia. No other changes were recommended today.  2. Coronary artery disease, native vessel. He appears stable without anginal symptoms.  3. Hyperlipidemia. Patient is on simvastatin 80 mg. He tells me that the [place] system is recommended he reduce his simvastatin dose to 40 mg which I think is reasonable.  For followup I will see him back in 6 months  Tonny Bollman 05/01/2012 11:41 PM

## 2012-05-04 ENCOUNTER — Telehealth: Payer: Self-pay | Admitting: Cardiovascular Disease

## 2012-05-04 NOTE — Telephone Encounter (Signed)
Walk in pt Form " Lab Work Dropped Off For pt" Gave to Lauren/[name] 05/04/12/KM

## 2012-05-18 ENCOUNTER — Other Ambulatory Visit (INDEPENDENT_AMBULATORY_CARE_PROVIDER_SITE_OTHER): Payer: Self-pay

## 2012-05-18 DIAGNOSIS — I2510 Atherosclerotic heart disease of native coronary artery without angina pectoris: Secondary | ICD-10-CM

## 2012-05-18 DIAGNOSIS — I5022 Chronic systolic (congestive) heart failure: Secondary | ICD-10-CM

## 2012-05-18 LAB — BASIC METABOLIC PANEL
CO2: 28 mEq/L (ref 19–32)
Calcium: 8.8 mg/dL (ref 8.4–10.5)
Creatinine, Ser: 1.1 mg/dL (ref 0.4–1.5)

## 2012-07-17 ENCOUNTER — Ambulatory Visit (INDEPENDENT_AMBULATORY_CARE_PROVIDER_SITE_OTHER): Payer: Self-pay | Admitting: *Deleted

## 2012-07-17 DIAGNOSIS — I2589 Other forms of chronic ischemic heart disease: Secondary | ICD-10-CM

## 2012-07-17 DIAGNOSIS — Z95810 Presence of automatic (implantable) cardiac defibrillator: Secondary | ICD-10-CM

## 2012-07-17 LAB — REMOTE ICD DEVICE
DEVICE MODEL ICD: 809738
RV LEAD AMPLITUDE: 12 mv
RV LEAD IMPEDENCE ICD: 410 Ohm
TZON-0004SLOWVT: 30
TZON-0005SLOWVT: 6
TZON-0010SLOWVT: 40 ms
VENTRICULAR PACING ICD: 1 pct

## 2012-07-22 ENCOUNTER — Encounter: Payer: Self-pay | Admitting: *Deleted

## 2012-09-03 ENCOUNTER — Encounter: Payer: Self-pay | Admitting: Internal Medicine

## 2012-10-07 ENCOUNTER — Encounter: Payer: Self-pay | Admitting: Internal Medicine

## 2012-10-07 ENCOUNTER — Ambulatory Visit (INDEPENDENT_AMBULATORY_CARE_PROVIDER_SITE_OTHER): Payer: Self-pay | Admitting: Internal Medicine

## 2012-10-07 ENCOUNTER — Encounter (INDEPENDENT_AMBULATORY_CARE_PROVIDER_SITE_OTHER): Payer: Self-pay

## 2012-10-07 VITALS — BP 118/78 | HR 72 | Ht 70.0 in | Wt 227.0 lb

## 2012-10-07 DIAGNOSIS — I5022 Chronic systolic (congestive) heart failure: Secondary | ICD-10-CM

## 2012-10-07 DIAGNOSIS — I2589 Other forms of chronic ischemic heart disease: Secondary | ICD-10-CM

## 2012-10-07 DIAGNOSIS — R0989 Other specified symptoms and signs involving the circulatory and respiratory systems: Secondary | ICD-10-CM

## 2012-10-07 DIAGNOSIS — Z95810 Presence of automatic (implantable) cardiac defibrillator: Secondary | ICD-10-CM

## 2012-10-07 DIAGNOSIS — I2510 Atherosclerotic heart disease of native coronary artery without angina pectoris: Secondary | ICD-10-CM

## 2012-10-07 LAB — ICD DEVICE OBSERVATION
BRDY-0002RV: 40 {beats}/min
FVT: 0
TOT-0007: 2
TZON-0003SLOWVT: 335 ms
VENTRICULAR PACING ICD: 1 pct
VF: 0

## 2012-10-07 NOTE — Patient Instructions (Addendum)
Your physician wants you to follow-up in: 12 months with Dr Allred You will receive a reminder letter in the mail two months in advance. If you don't receive a letter, please call our office to schedule the follow-up appointment.  

## 2012-10-07 NOTE — Progress Notes (Signed)
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  Since last being seen in our clinic, the patient reports doing very well.  Today, he denies symptoms of palpitations, chest pain, shortness of breath,  lower extremity edema, dizziness, presyncope, syncope, or ICD shocks.  The patient is otherwise without complaint today.   Past Medical History  Diagnosis Date  . CAD (coronary artery disease)     anterior MI 2008, treated with bare metal stent to LAD  . MI (myocardial infarction)   . Ischemic cardiomyopathy     NYHA Class II/III CHF  . 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 HLD (hyperlipidemia)   . HTN (hypertension)   . GERD (gastroesophageal reflux disease)   . Umbilical hernia    Past Surgical History  Procedure Laterality Date  . Finger surgery  2/09    laceration repair  . Cardiac defibrillator placement  08/07/10    SJM Fortify VR ST implanted by Dr Johney Frame, part of the Analyze ST ICD study    Current Outpatient Prescriptions  Medication Sig Dispense Refill  . aspirin 325 MG tablet Take 325 mg by mouth daily.        . carvedilol (COREG) 25 MG tablet Take 25 mg by mouth 2 (two) times daily with a meal.        . cholecalciferol (VITAMIN D) 1000 UNITS tablet Take 1,000 Units by mouth daily.      . fish oil-omega-3 fatty acids 1000 MG capsule Take 1 g by mouth daily.       Marland Kitchen lisinopril (PRINIVIL,ZESTRIL) 10 MG tablet Take 10 mg by mouth daily.        . Multiple Vitamin (MULTIVITAMIN) tablet Take 1 tablet by mouth daily.        . nitroGLYCERIN (NITROSTAT) 0.4 MG SL tablet Place 1 tablet (0.4 mg total) under the tongue every 5 (five) minutes as needed.  25 tablet  1  . simvastatin (ZOCOR) 80 MG tablet TAKE 1 TABLET (80 MG TOTAL) BY MOUTH AT BEDTIME.  90 tablet  3  . spironolactone (ALDACTONE) 25 MG tablet Take 0.5 tablets (12.5 mg total) by mouth daily.  90 tablet  3   No  current facility-administered medications for this visit.    Physical Exam: Filed Vitals:   10/07/12 0953  BP: 118/78  Pulse: 72  Height: 5\' 10"  (1.778 m)  Weight: 227 lb (102.967 kg)    GEN- The patient is well appearing, alert and oriented x 3 today.   Head- normocephalic, atraumatic Eyes-  Sclera clear, conjunctiva pink Ears- hearing intact Oropharynx- clear Lungs- Clear to ausculation bilaterally, normal work of breathing Chest- ICD pocket is well healed Heart- Regular rate and rhythm, no murmurs, rubs or gallops, PMI not laterally displaced GI- soft, NT, ND, + BS Extremities- no clubbing, cyanosis, or edema  ICD interrogation- reviewed in detail today,  See PACEART report  Assessment and Plan:  1. CAD/ ischemic CM No ischemic symptoms No symptoms of CHF  Normal ICD function See Pace Art report No changes today  Merlin Return in 1 year

## 2013-01-11 ENCOUNTER — Encounter: Payer: Self-pay | Admitting: *Deleted

## 2013-01-11 ENCOUNTER — Encounter: Payer: Self-pay | Admitting: Internal Medicine

## 2013-01-11 DIAGNOSIS — I2589 Other forms of chronic ischemic heart disease: Secondary | ICD-10-CM

## 2013-01-11 DIAGNOSIS — I5022 Chronic systolic (congestive) heart failure: Secondary | ICD-10-CM

## 2013-01-11 DIAGNOSIS — Z95810 Presence of automatic (implantable) cardiac defibrillator: Secondary | ICD-10-CM

## 2013-01-11 LAB — MDC_IDC_ENUM_SESS_TYPE_REMOTE
Battery Remaining Longevity: 84 mo
Brady Statistic RV Percent Paced: 1 %
HighPow Impedance: 71 Ohm
Implantable Pulse Generator Serial Number: 809738
Lead Channel Impedance Value: 440 Ohm
Lead Channel Pacing Threshold Amplitude: 1 V
Lead Channel Pacing Threshold Pulse Width: 0.5 ms
Lead Channel Sensing Intrinsic Amplitude: 12 mV
Lead Channel Setting Pacing Amplitude: 2 V
Lead Channel Setting Pacing Pulse Width: 0.5 ms
Lead Channel Setting Sensing Sensitivity: 0.5 mV
Zone Setting Detection Interval: 280 ms
Zone Setting Detection Interval: 335 ms

## 2013-01-17 ENCOUNTER — Other Ambulatory Visit: Payer: Self-pay | Admitting: Internal Medicine

## 2013-01-20 ENCOUNTER — Encounter: Payer: Self-pay | Admitting: *Deleted

## 2013-03-22 IMAGING — CR DG KNEE COMPLETE 4+V*L*
1 series · 4 of 4 positions shown · non-contrast
Comparison: none

REASON FOR EXAM: Hx: trauma knee pain
COMMENTS:

PROCEDURE:     DXR - DXR KNEE LT COMP WITH OBLIQUES  - October 10, 2011  [DATE]
RESULT:     Left knee images show no definite fracture, dislocation or
radiopaque foreign body.

[Series 1: t knee ap left · 0.14mm/px · 4 of 4 slices shown]
[im 1/4]
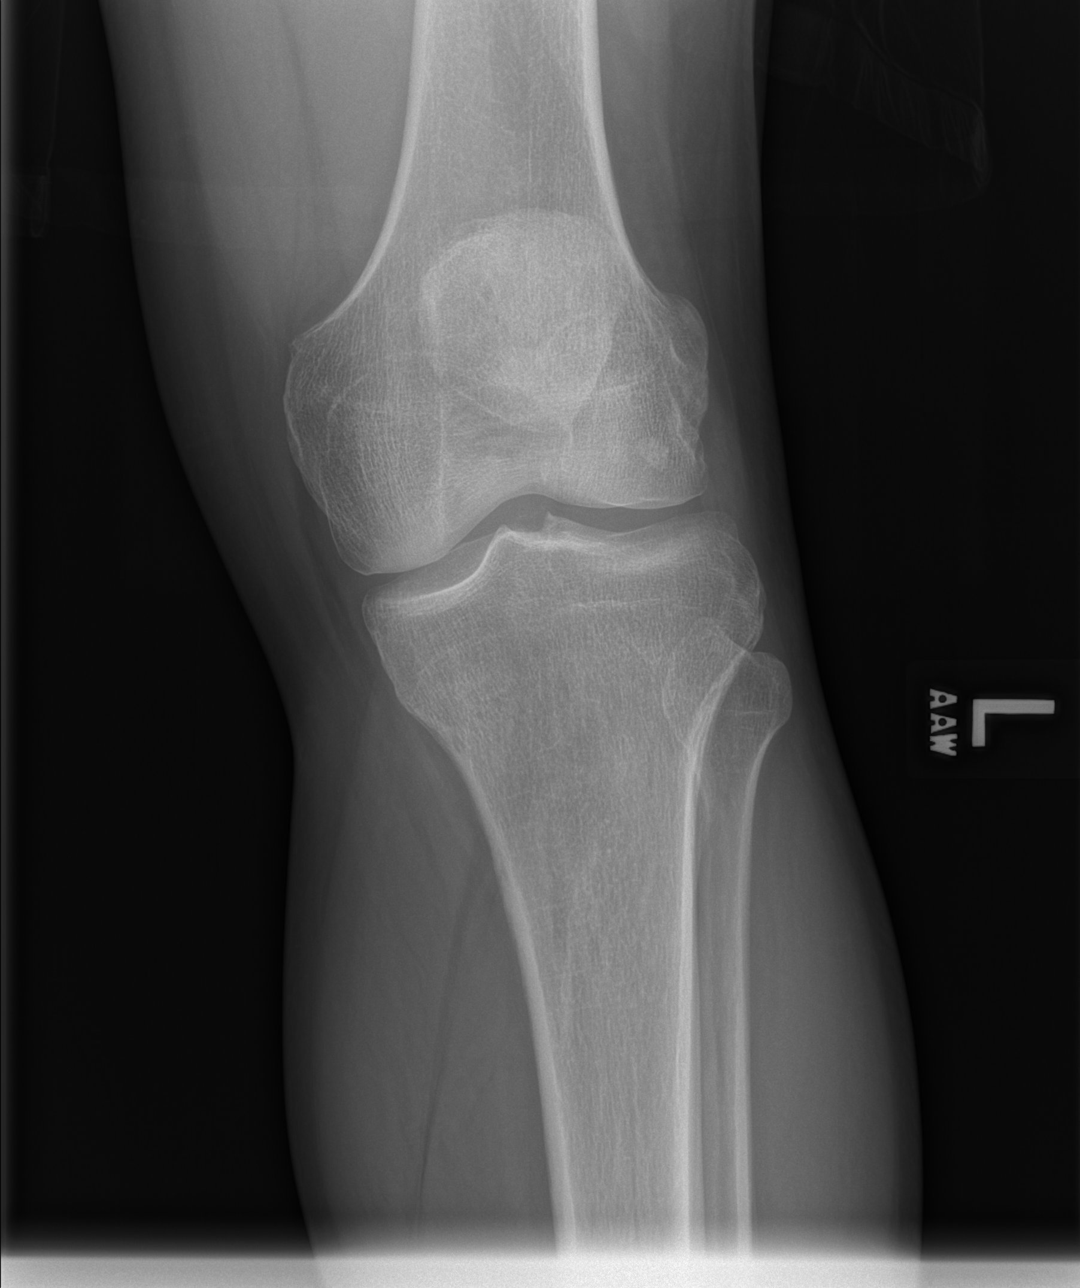
[im 2/4]
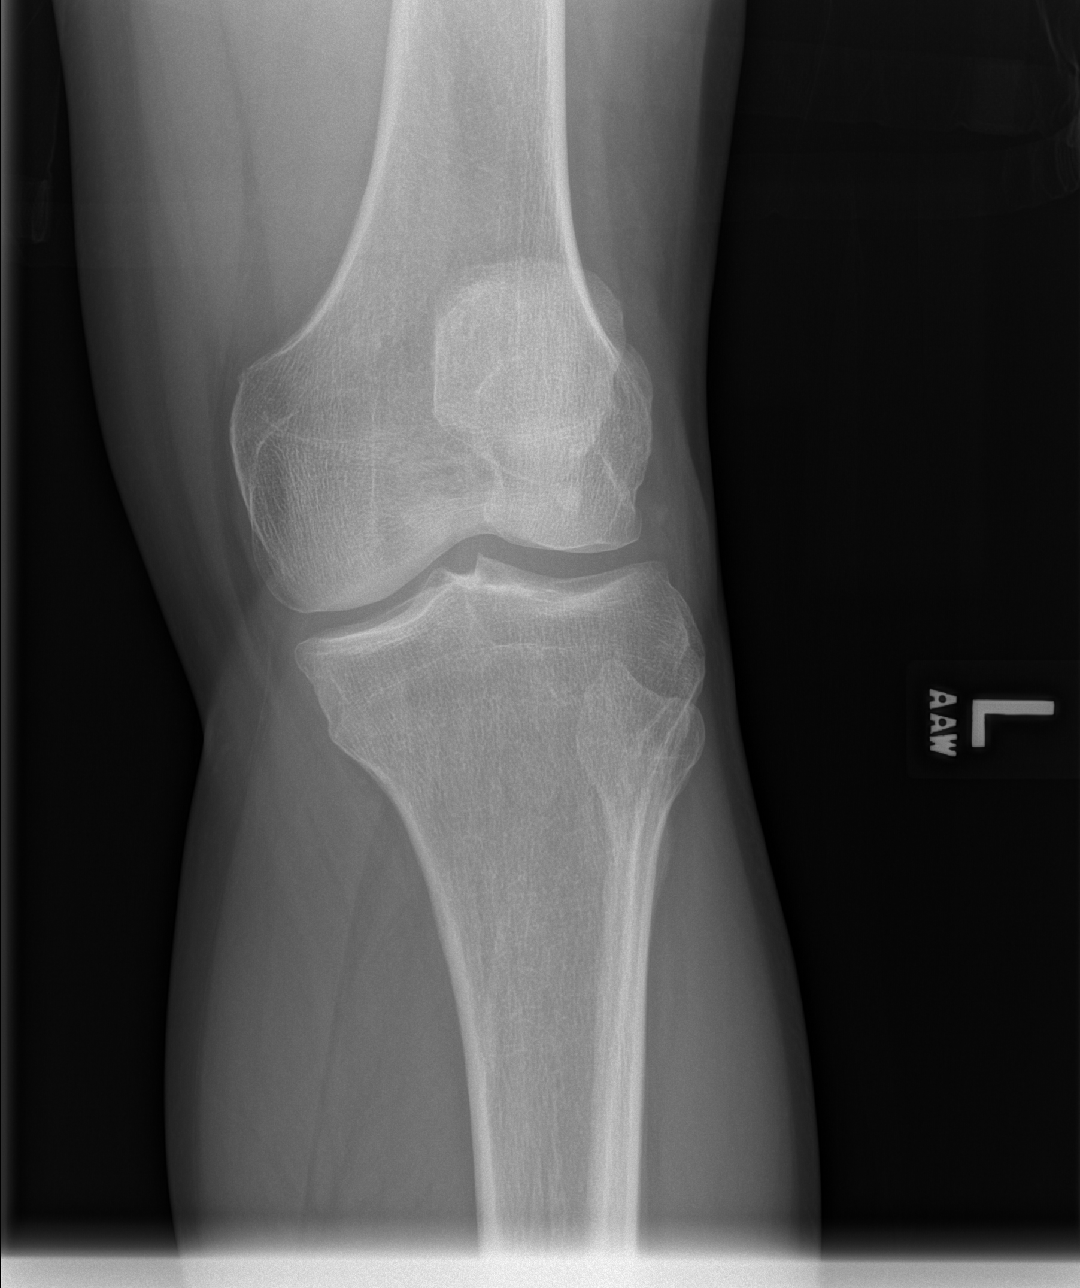
[im 3/4]
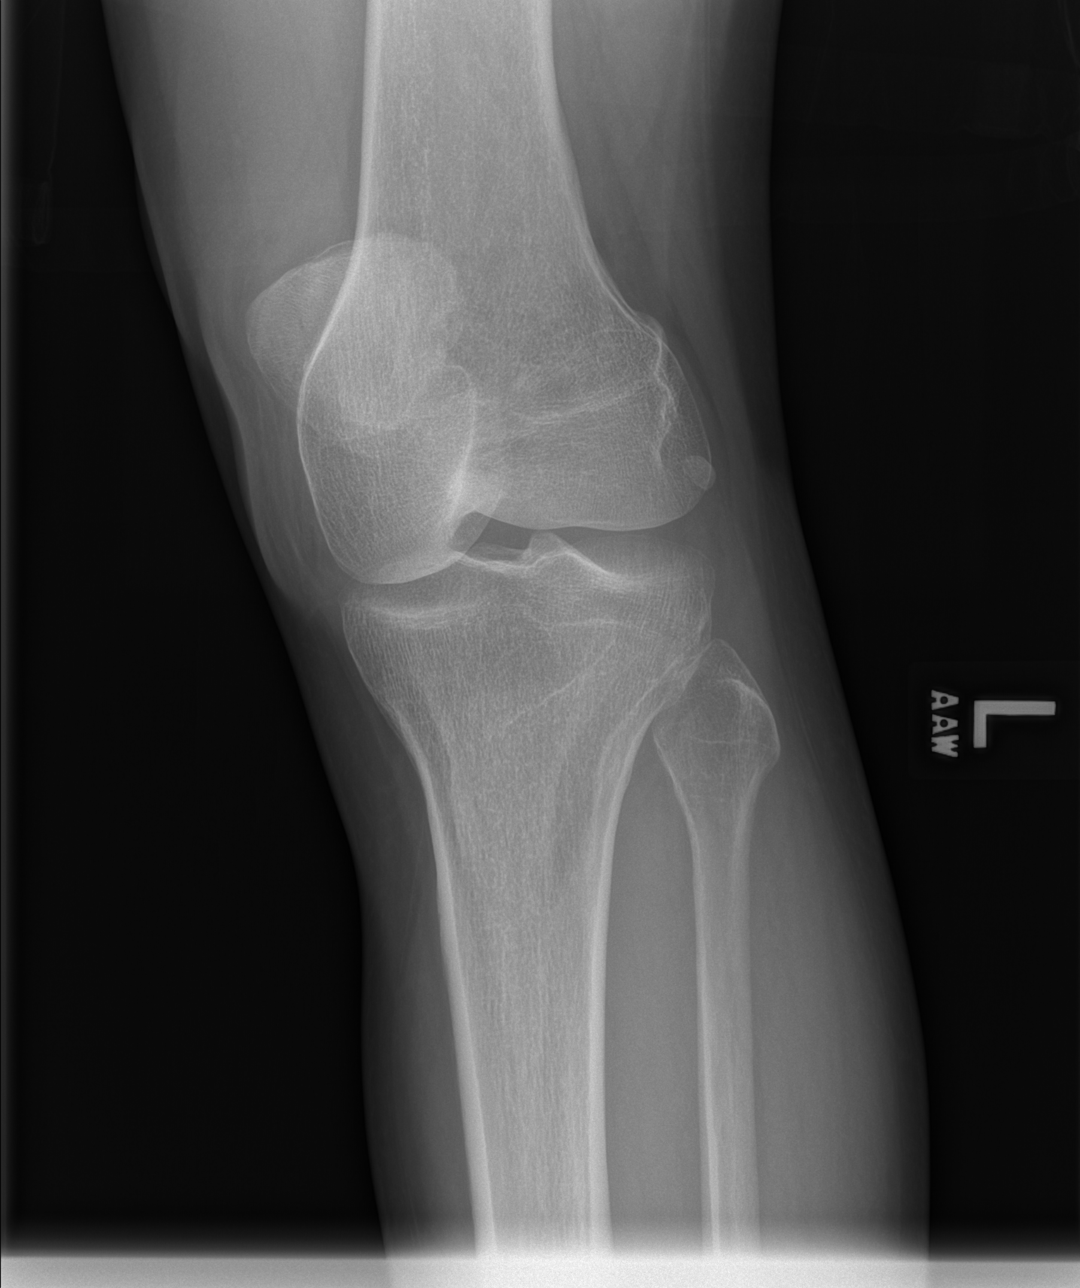
[im 4/4]
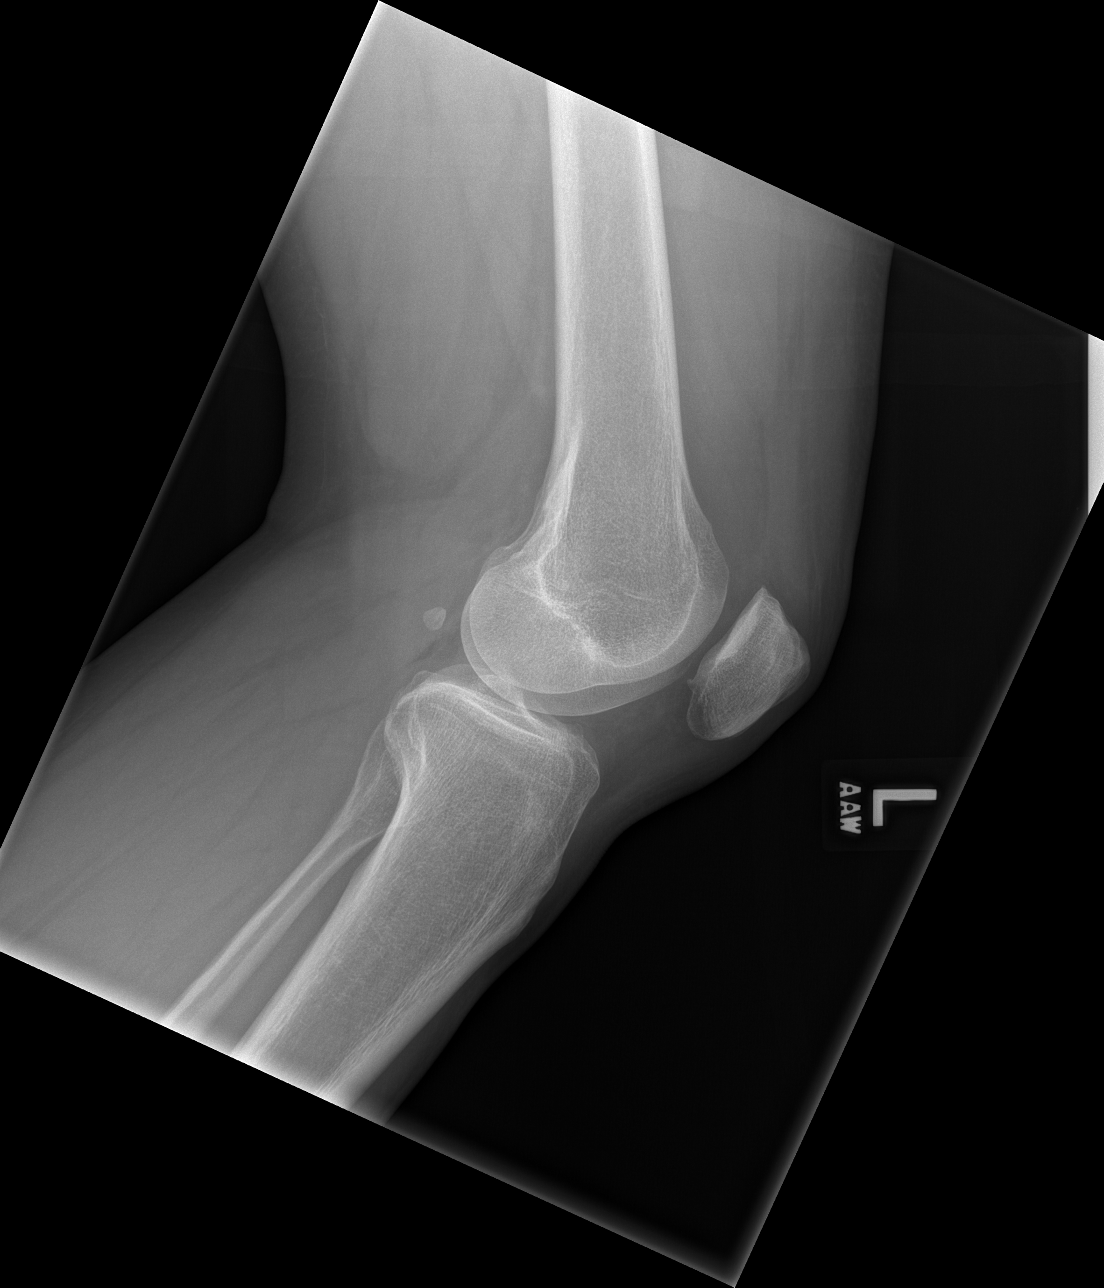

[4 of 4 positions shown; findings below may reference images not displayed]

IMPRESSION: Please see above.

[REDACTED]

## 2013-05-05 ENCOUNTER — Ambulatory Visit (INDEPENDENT_AMBULATORY_CARE_PROVIDER_SITE_OTHER): Payer: Medicare HMO | Admitting: *Deleted

## 2013-05-05 DIAGNOSIS — I2589 Other forms of chronic ischemic heart disease: Secondary | ICD-10-CM

## 2013-05-05 DIAGNOSIS — Z95810 Presence of automatic (implantable) cardiac defibrillator: Secondary | ICD-10-CM

## 2013-05-06 LAB — MDC_IDC_ENUM_SESS_TYPE_INCLINIC
Battery Remaining Longevity: 81.6 mo
HIGH POWER IMPEDANCE MEASURED VALUE: 81 Ohm
Implantable Pulse Generator Serial Number: 809738
Lead Channel Impedance Value: 462.5 Ohm
Lead Channel Pacing Threshold Amplitude: 0.75 V
Lead Channel Pacing Threshold Amplitude: 0.75 V
Lead Channel Pacing Threshold Pulse Width: 0.5 ms
Lead Channel Sensing Intrinsic Amplitude: 12 mV
Lead Channel Setting Pacing Pulse Width: 0.5 ms
Lead Channel Setting Sensing Sensitivity: 0.5 mV
MDC IDC MSMT LEADCHNL RV PACING THRESHOLD PULSEWIDTH: 0.5 ms
MDC IDC SESS DTM: 20150304164407
MDC IDC SET LEADCHNL RV PACING AMPLITUDE: 2 V
MDC IDC SET ZONE DETECTION INTERVAL: 335 ms
MDC IDC STAT BRADY RV PERCENT PACED: 0 %
Zone Setting Detection Interval: 280 ms

## 2013-05-06 NOTE — Progress Notes (Signed)
ICD check in clinic by [employer]. Normal device function. Thresholds and sensing consistent with previous device measurements. Impedance trends stable over time. No evidence of any ventricular arrhythmias. Histogram distribution appropriate for patient and level of activity. Changes per ST protocol. Device programmed at appropriate safety margins. Device programmed to optimize intrinsic conduction. Estimated longevity 6.8-7.53yrs. Pt enrolled in remote follow-up.   Merlin Analyze 08/04/13 & ROV w/ Dr. Johney Frame Aug/2015.

## 2013-06-08 ENCOUNTER — Encounter: Payer: Self-pay | Admitting: Internal Medicine

## 2013-06-16 ENCOUNTER — Ambulatory Visit (INDEPENDENT_AMBULATORY_CARE_PROVIDER_SITE_OTHER): Payer: Medicare HMO | Admitting: Physician Assistant

## 2013-06-16 ENCOUNTER — Encounter: Payer: Self-pay | Admitting: Physician Assistant

## 2013-06-16 VITALS — BP 120/70 | HR 66 | Ht 70.0 in | Wt 219.0 lb

## 2013-06-16 DIAGNOSIS — I2589 Other forms of chronic ischemic heart disease: Secondary | ICD-10-CM

## 2013-06-16 DIAGNOSIS — I2510 Atherosclerotic heart disease of native coronary artery without angina pectoris: Secondary | ICD-10-CM

## 2013-06-16 DIAGNOSIS — E785 Hyperlipidemia, unspecified: Secondary | ICD-10-CM

## 2013-06-16 DIAGNOSIS — I5022 Chronic systolic (congestive) heart failure: Secondary | ICD-10-CM

## 2013-06-16 DIAGNOSIS — I10 Essential (primary) hypertension: Secondary | ICD-10-CM

## 2013-06-16 LAB — BASIC METABOLIC PANEL
BUN: 16 mg/dL (ref 6–23)
CALCIUM: 9.1 mg/dL (ref 8.4–10.5)
CO2: 31 mEq/L (ref 19–32)
CREATININE: 0.9 mg/dL (ref 0.4–1.5)
Chloride: 103 mEq/L (ref 96–112)
GFR: 116.31 mL/min (ref >60.00)
Glucose, Bld: 87 mg/dL (ref 70–99)
Potassium: 3.8 mEq/L (ref 3.5–5.1)
SODIUM: 137 meq/L (ref 135–145)

## 2013-06-16 NOTE — Patient Instructions (Signed)
Your physician recommends that you continue on your current medications as directed. Please refer to the Current Medication list given to you today.  Your physician recommends that you return for lab work in: today  Your physician wants you to follow-up in: 6 months. You will receive a reminder letter in the mail two months in advance. If you don't receive a letter, please call our office to schedule the follow-up appointment.   

## 2013-06-16 NOTE — Progress Notes (Signed)
[address] [place], [place]  [address] Phone: [phone] Fax:  [phone]  Date:  06/16/2013   ID:  JOSEAN SEGOVIA, DOB 12-Aug-1960, MRN [id number]  PCP:  Fredirick Maudlin, MD  Cardiologist:  Dr. Tonny Bollman   Electrophysiologist:  Dr. Hillis Range    History of Present Illness: [name] [name] is a 53 y.o. male with a hx of CAD, status post anterior MI in 2008 treated with a BMS to the LAD, ischemic cardiomyopathy with an EF of 25-30%, systolic CHF, status post AICD. Last seen by Dr. Excell Seltzer 04/2012. Spironolactone was initiated at that time.  He is doing well. He denies chest pain, significant dyspnea, syncope, orthopnea, PND or edema.  Studies:  - LHC (03/2006):  prox to mid LAD occluded => 2.75 x 28 mm Vision BMS   - Echo (06/2010):  Inferoapical, apical, septal and dist ant AK, inferobasal HK, mild LVH, EF 25-30%, mild MR, mild LAE.  - Cardiac MRI (08/2008):  Ant, inf, septal, apical AK with full thickness scar, EF 23%   Recent Labs: No results found for requested labs within last 365 days. 05/18/2012:  K 3.9, Cr 1.1  Wt Readings from Last 3 Encounters:  06/16/13 219 lb (99.338 kg)  10/07/12 227 lb (102.967 kg)  05/01/12 221 lb (100.245 kg)     Past Medical History  Diagnosis Date  . CAD (coronary artery disease)     anterior MI 2008, treated with bare metal stent to LAD  . MI (myocardial infarction)   . Ischemic cardiomyopathy     NYHA Class II/III CHF  . 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 HLD (hyperlipidemia)   . HTN (hypertension)   . GERD (gastroesophageal reflux disease)   . Umbilical hernia     Current Outpatient Prescriptions  Medication Sig Dispense Refill  . aspirin 325 MG tablet Take 325 mg by mouth daily.        . carvedilol (COREG) 25 MG tablet Take 25 mg by mouth 2 (two) times daily with a meal.        . cholecalciferol (VITAMIN D) 1000 UNITS tablet Take 1,000 Units by mouth daily.      . fish oil-omega-3 fatty acids  1000 MG capsule Take 1 g by mouth daily.       Marland Kitchen lisinopril (PRINIVIL,ZESTRIL) 10 MG tablet Take 10 mg by mouth daily.        . Multiple Vitamin (MULTIVITAMIN) tablet Take 1 tablet by mouth daily.        . nitroGLYCERIN (NITROSTAT) 0.4 MG SL tablet Place 1 tablet (0.4 mg total) under the tongue every 5 (five) minutes as needed.  25 tablet  1  . simvastatin (ZOCOR) 80 MG tablet TAKE 1 TABLET (80 MG TOTAL) BY MOUTH AT BEDTIME.  90 tablet  3  . spironolactone (ALDACTONE) 25 MG tablet Take 0.5 tablets (12.5 mg total) by mouth daily.  90 tablet  3   No current facility-administered medications for this visit.    Allergies:   Review of patient's allergies indicates no known allergies.   Social History:  The patient  reports that he quit smoking about 7 years ago. His smoking use included Cigars. He does not have any smokeless tobacco history on file. He reports that he drinks alcohol. He reports that he does not use illicit drugs.   Family History:  The patient's family history includes Diabetes in an other family member; Heart failure in an other  family member.   ROS:  Please see the history of present illness.      All other systems reviewed and negative.   PHYSICAL EXAM: VS:  BP 120/70  Pulse 66  Ht 5\' 10"  (1.778 m)  Wt 219 lb (99.338 kg)  BMI 31.42 kg/m2 Well nourished, well developed, in no acute distress HEENT: normal Neck: no JVD Vascular: No carotid bruits bilaterally Cardiac:  normal S1, S2; RRR; no murmur Lungs:  clear to auscultation bilaterally, no wheezing, rhonchi or rales Abd: soft, nontender, no hepatomegaly Ext: no edema Skin: warm and dry Neuro:  CNs 2-12 intact, no focal abnormalities noted  EKG:  NSR, HR 66, normal axis, inferior, anterolateral Q waves, no change from prior tracing     ASSESSMENT AND PLAN:  1. CAD: He is doing well. No angina. Continue aspirin, beta blocker, statin. 2. Ischemic Cardiomyopathy: Continue current dose of beta blocker, ACEI,  spironolactone. Check basic metabolic panel today to follow up on renal function and potassium. 3. Chronic Systolic CHF: Volume stable. He is NYHA class 2-2b. 4. Hypertension: Controlled. 5. Hyperlipidemia: Continue statin. [facility] 6. S/P AICD: Follow up with EP as planned. 7. Disposition: Follow up with Dr. Excell Seltzerooper in 6 months.  Signed, Tereso NewcomerScott Weaver, PA-C  06/16/2013 8:30 AM

## 2013-08-04 ENCOUNTER — Ambulatory Visit (INDEPENDENT_AMBULATORY_CARE_PROVIDER_SITE_OTHER): Payer: Medicare HMO | Admitting: *Deleted

## 2013-08-04 DIAGNOSIS — I2589 Other forms of chronic ischemic heart disease: Secondary | ICD-10-CM

## 2013-08-04 NOTE — Progress Notes (Signed)
Remote ICD transmission.   

## 2013-08-05 LAB — MDC_IDC_ENUM_SESS_TYPE_REMOTE
Brady Statistic RV Percent Paced: 1 % — CL
HighPow Impedance: 77 Ohm
Implantable Pulse Generator Serial Number: 809738
Lead Channel Sensing Intrinsic Amplitude: 12 mV
Lead Channel Setting Pacing Amplitude: 2 V
Lead Channel Setting Pacing Pulse Width: 0.5 ms
Lead Channel Setting Sensing Sensitivity: 0.5 mV
MDC IDC MSMT LEADCHNL RV IMPEDANCE VALUE: 410 Ohm
MDC IDC SET ZONE DETECTION INTERVAL: 280 ms
MDC IDC SET ZONE DETECTION INTERVAL: 335 ms

## 2013-08-13 ENCOUNTER — Encounter: Payer: Self-pay | Admitting: Cardiology

## 2013-08-18 ENCOUNTER — Encounter: Payer: Self-pay | Admitting: Internal Medicine

## 2013-09-24 ENCOUNTER — Encounter: Payer: Medicare HMO | Admitting: Internal Medicine

## 2013-10-20 ENCOUNTER — Encounter: Payer: Medicare HMO | Admitting: Internal Medicine

## 2013-10-25 ENCOUNTER — Encounter: Payer: Self-pay | Admitting: Internal Medicine

## 2013-10-25 ENCOUNTER — Ambulatory Visit (INDEPENDENT_AMBULATORY_CARE_PROVIDER_SITE_OTHER): Payer: Medicare HMO | Admitting: Internal Medicine

## 2013-10-25 VITALS — BP 129/76 | HR 74 | Ht 70.0 in | Wt 231.0 lb

## 2013-10-25 DIAGNOSIS — Z95810 Presence of automatic (implantable) cardiac defibrillator: Secondary | ICD-10-CM

## 2013-10-25 DIAGNOSIS — I2589 Other forms of chronic ischemic heart disease: Secondary | ICD-10-CM

## 2013-10-25 DIAGNOSIS — I428 Other cardiomyopathies: Secondary | ICD-10-CM

## 2013-10-25 LAB — MDC_IDC_ENUM_SESS_TYPE_INCLINIC
Battery Remaining Longevity: 76.8 mo
Brady Statistic RV Percent Paced: 0 %
Date Time Interrogation Session: 20150824114157
HIGH POWER IMPEDANCE MEASURED VALUE: 74.25 Ohm
Lead Channel Pacing Threshold Amplitude: 1 V
Lead Channel Pacing Threshold Pulse Width: 0.5 ms
Lead Channel Setting Pacing Amplitude: 2 V
MDC IDC MSMT LEADCHNL RV IMPEDANCE VALUE: 387.5 Ohm
MDC IDC MSMT LEADCHNL RV SENSING INTR AMPL: 12 mV
MDC IDC PG SERIAL: 809738
MDC IDC SET LEADCHNL RV PACING PULSEWIDTH: 0.5 ms
MDC IDC SET LEADCHNL RV SENSING SENSITIVITY: 0.5 mV
Zone Setting Detection Interval: 280 ms
Zone Setting Detection Interval: 335 ms

## 2013-10-25 NOTE — Progress Notes (Signed)
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  Since last being seen in our clinic, the patient reports doing very well.  He continues to work diligently on lifestyle modifications. Today, he denies symptoms of palpitations, chest pain, shortness of breath,  lower extremity edema, dizziness, presyncope, syncope, or ICD shocks.  The patient is otherwise without complaint today.   Past Medical History  Diagnosis Date  . CAD (coronary artery disease)     anterior MI 2008, treated with bare metal stent to LAD  . MI (myocardial infarction)   . Ischemic cardiomyopathy     NYHA Class II/III CHF  . 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 HLD (hyperlipidemia)   . HTN (hypertension)   . GERD (gastroesophageal reflux disease)   . Umbilical hernia    Past Surgical History  Procedure Laterality Date  . Finger surgery  2/09    laceration repair  . Cardiac defibrillator placement  08/07/10    SJM Fortify VR ST implanted by Dr Johney Frame, part of the Analyze ST ICD study    Current Outpatient Prescriptions  Medication Sig Dispense Refill  . aspirin 325 MG tablet Take 325 mg by mouth daily.        . carvedilol (COREG) 25 MG tablet Take 25 mg by mouth 2 (two) times daily with a meal.        . cholecalciferol (VITAMIN D) 1000 UNITS tablet Take 1,000 Units by mouth daily.      . fish oil-omega-3 fatty acids 1000 MG capsule Take 1 g by mouth daily.       Marland Kitchen lisinopril (PRINIVIL,ZESTRIL) 10 MG tablet Take 10 mg by mouth daily.        . Multiple Vitamin (MULTIVITAMIN) tablet Take 1 tablet by mouth daily.        . nitroGLYCERIN (NITROSTAT) 0.4 MG SL tablet Place 1 tablet (0.4 mg total) under the tongue every 5 (five) minutes as needed.  25 tablet  1  . simvastatin (ZOCOR) 80 MG tablet TAKE 1 TABLET (80 MG TOTAL) BY MOUTH AT BEDTIME.  90 tablet  3  . spironolactone (ALDACTONE) 25 MG tablet Take 0.5  tablets (12.5 mg total) by mouth daily.  90 tablet  3   No current facility-administered medications for this visit.    Physical Exam: Filed Vitals:   10/25/13 1051  BP: 129/76  Pulse: 74  Height: 5\' 10"  (1.778 m)  Weight: 231 lb (104.781 kg)    GEN- The patient is well appearing, alert and oriented x 3 today.   Head- normocephalic, atraumatic Eyes-  Sclera clear, conjunctiva pink Ears- hearing intact Oropharynx- clear Lungs- Clear to ausculation bilaterally, normal work of breathing Chest- ICD pocket is well healed Heart- Regular rate and rhythm, no murmurs, rubs or gallops, PMI not laterally displaced GI- soft, NT, ND, + BS Extremities- no clubbing, cyanosis, or edema  ICD interrogation- reviewed in detail today,  See PACEART report ekg today reveals sinus rhythm anterolateral infarct pattern  Assessment and Plan:  1. CAD/ ischemic CM No ischemic symptoms No symptoms of CHF  Normal ICD function See Pace Art report No changes today  Merlin Return in 1 year

## 2013-10-25 NOTE — Patient Instructions (Addendum)
Remote monitoring is used to monitor your Pacemaker or ICD from home. This monitoring reduces the number of office visits required to check your device to one time per year. It allows [place] to keep an eye on the functioning of your device to ensure it is working properly. You are scheduled for a device check from home on 01/26/14. You may send your transmission at any time that day. If you have a wireless device, the transmission will be sent automatically. After your physician reviews your transmission, you will receive a postcard with your next transmission date.  Your physician wants you to follow-up in: 6 months with research and 12 months with Dr. Jacquiline Doe will receive a reminder letter in the mail two months in advance. If you don't receive a letter, please call our office to schedule the follow-up appointment.

## 2013-10-27 ENCOUNTER — Encounter: Payer: Self-pay | Admitting: Internal Medicine

## 2013-12-23 ENCOUNTER — Encounter: Payer: Self-pay | Admitting: Cardiovascular Disease

## 2013-12-23 ENCOUNTER — Ambulatory Visit (INDEPENDENT_AMBULATORY_CARE_PROVIDER_SITE_OTHER): Payer: Medicare HMO | Admitting: Cardiovascular Disease

## 2013-12-23 VITALS — BP 132/82 | HR 63 | Ht 70.0 in | Wt 231.8 lb

## 2013-12-23 DIAGNOSIS — E785 Hyperlipidemia, unspecified: Secondary | ICD-10-CM

## 2013-12-23 DIAGNOSIS — I2510 Atherosclerotic heart disease of native coronary artery without angina pectoris: Secondary | ICD-10-CM

## 2013-12-23 DIAGNOSIS — I10 Essential (primary) hypertension: Secondary | ICD-10-CM

## 2013-12-23 NOTE — Progress Notes (Signed)
HPI:   53 year old gentleman presenting for followup evaluation. The patient has coronary artery disease with history of an anterior MI in 2008 treated with bare metal stenting of the LAD. He has a severe ischemic cardiomyopathy with LVEF less than 30%. The patient has undergone ICD implantation. He presents today for followup evaluation. Most recent echocardiogram from 2012 demonstrated inferoapical, apical, and anteroseptal akinesis with an LVEF of 25-30%. There was mild mitral regurgitation.  The patient is doing well from a symptomatic perspective. He denies chest pain, shortness of breath, or leg swelling. He has no palpitations, lightheadedness, or syncope. He reports no ICD discharges. He's not been physically active recently. He is "getting back into walking." He reports compliance with his medications.  Outpatient Encounter Prescriptions as of 12/23/2013  Medication Sig  . aspirin 325 MG tablet Take 325 mg by mouth daily.    . carvedilol (COREG) 25 MG tablet Take 25 mg by mouth 2 (two) times daily with a meal.    . cholecalciferol (VITAMIN D) 1000 UNITS tablet Take 1,000 Units by mouth daily.  . fish oil-omega-3 fatty acids 1000 MG capsule Take 1 g by mouth daily.   Marland Kitchen lisinopril (PRINIVIL,ZESTRIL) 10 MG tablet Take 10 mg by mouth daily.    . Multiple Vitamin (MULTIVITAMIN) tablet Take 1 tablet by mouth daily.    . nitroGLYCERIN (NITROSTAT) 0.4 MG SL tablet Place 1 tablet (0.4 mg total) under the tongue every 5 (five) minutes as needed.  . simvastatin (ZOCOR) 80 MG tablet TAKE 1/2 TABLET (40 MG TOTAL) BY MOUTH AT BEDTIME.  Marland Kitchen spironolactone (ALDACTONE) 25 MG tablet Take 0.5 tablets (12.5 mg total) by mouth daily.  . [DISCONTINUED] simvastatin (ZOCOR) 80 MG tablet TAKE 1 TABLET (80 MG TOTAL) BY MOUTH AT BEDTIME.    No Known Allergies  Past Medical History  Diagnosis Date  . CAD (coronary artery disease)     anterior MI 2008, treated with bare metal stent to LAD  . MI (myocardial  infarction)   . Ischemic cardiomyopathy     NYHA Class II/III CHF  . 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 HLD (hyperlipidemia)   . HTN (hypertension)   . GERD (gastroesophageal reflux disease)   . Umbilical hernia    ROS: Negative except as per HPI  BP 132/82  Pulse 63  Ht 5\' 10"  (1.778 m)  Wt 231 lb 12.8 oz (105.144 kg)  BMI 33.26 kg/m2  PHYSICAL EXAM: Pt is alert and oriented, pleasant overweight male in NAD HEENT: normal Neck: JVP - normal, carotids 2+= without bruits Lungs: CTA bilaterally CV: RRR without murmur or gallop Abd: soft, NT, Positive BS, no hepatomegaly Ext: no C/C/E, distal pulses intact and equal Skin: warm/dry no rash  EKG:  Normal sinus rhythm 63 beats per minute, age indeterminate anterior infarct, possible age indeterminate inferior infarct.  ASSESSMENT AND PLAN: 1. Coronary artery disease, native vessel, with old MI. The patient has no symptoms of angina. He will continue on his current medical therapy which includes aspirin, beta blocker, and ACE inhibitor, and a statin drug.  2. Chronic systolic heart failure secondary to underlying ischemic cardiomyopathy. Minimal symptoms at present, but low activity level noted. Suspect New York Heart Association functional class II. He will continue on his current medical therapy which was reviewed today. There is no evidence of volume overload on exam.    3. Hyperlipidemia. The patient is managed with atorvastatin through the [facility] in [place].  4.  Hypertension, essential. Blood pressure at goal on carvedilol, lisinopril, and spironolactone.  5. Obesity with BMI greater than 30. Lengthy discussion about the importance of regular exercise and dietary modification with focus on carbohydrate restriction. Try to set a reasonable goal weight of less than 210 pounds.  Tonny BollmanMichael Jaquelinne Glendening MD 12/23/2013 9:24 AM

## 2013-12-23 NOTE — Patient Instructions (Signed)
Your physician wants you to follow-up in: 1 YEAR with Dr [name].  You will receive a reminder letter in the mail two months in advance. If you don't receive a letter, please call our office to schedule the follow-up appointment.  Your physician recommends that you continue on your current medications as directed. Please refer to the Current Medication list given to you today.  

## 2014-01-26 ENCOUNTER — Ambulatory Visit (INDEPENDENT_AMBULATORY_CARE_PROVIDER_SITE_OTHER): Payer: Medicare HMO | Admitting: *Deleted

## 2014-01-26 ENCOUNTER — Encounter: Payer: Self-pay | Admitting: Internal Medicine

## 2014-01-26 DIAGNOSIS — I429 Cardiomyopathy, unspecified: Secondary | ICD-10-CM

## 2014-01-26 DIAGNOSIS — Z95810 Presence of automatic (implantable) cardiac defibrillator: Secondary | ICD-10-CM

## 2014-01-26 NOTE — Progress Notes (Signed)
Remote ICD transmission.   

## 2014-01-29 LAB — MDC_IDC_ENUM_SESS_TYPE_REMOTE
HighPow Impedance: 77 Ohm
Implantable Pulse Generator Serial Number: 809738
Lead Channel Impedance Value: 410 Ohm
Lead Channel Setting Pacing Pulse Width: 0.5 ms
MDC IDC MSMT BATTERY REMAINING PERCENTAGE: 65 %
MDC IDC MSMT LEADCHNL RV SENSING INTR AMPL: 12 mV
MDC IDC SET LEADCHNL RV PACING AMPLITUDE: 2 V
MDC IDC SET LEADCHNL RV SENSING SENSITIVITY: 0.5 mV
MDC IDC STAT BRADY RV PERCENT PACED: 1 % — AB
Zone Setting Detection Interval: 280 ms
Zone Setting Detection Interval: 335 ms

## 2014-02-08 ENCOUNTER — Encounter: Payer: Self-pay | Admitting: Cardiology

## 2014-04-08 ENCOUNTER — Telehealth: Payer: Self-pay | Admitting: *Deleted

## 2014-04-08 NOTE — Telephone Encounter (Signed)
Left Message - No communication between device and Merlin. Pt advised to do manual reset and if that does not work to call SJM tech support at [phone].

## 2014-04-11 ENCOUNTER — Ambulatory Visit (INDEPENDENT_AMBULATORY_CARE_PROVIDER_SITE_OTHER): Payer: Self-pay | Admitting: *Deleted

## 2014-04-11 DIAGNOSIS — Z95810 Presence of automatic (implantable) cardiac defibrillator: Secondary | ICD-10-CM

## 2014-04-11 DIAGNOSIS — I5022 Chronic systolic (congestive) heart failure: Secondary | ICD-10-CM

## 2014-04-11 LAB — MDC_IDC_ENUM_SESS_TYPE_INCLINIC
Battery Remaining Longevity: 72 mo
Brady Statistic RV Percent Paced: 0 %
Date Time Interrogation Session: 20160208122624
HighPow Impedance: 81 Ohm
Implantable Pulse Generator Serial Number: 809738
Lead Channel Pacing Threshold Amplitude: 1.25 V
Lead Channel Pacing Threshold Amplitude: 1.25 V
Lead Channel Pacing Threshold Pulse Width: 0.5 ms
Lead Channel Sensing Intrinsic Amplitude: 12 mV
Lead Channel Setting Pacing Pulse Width: 0.5 ms
Lead Channel Setting Sensing Sensitivity: 0.5 mV
MDC IDC MSMT LEADCHNL RV IMPEDANCE VALUE: 375 Ohm
MDC IDC MSMT LEADCHNL RV PACING THRESHOLD PULSEWIDTH: 0.5 ms
MDC IDC SET LEADCHNL RV PACING AMPLITUDE: 2.5 V
Zone Setting Detection Interval: 280 ms
Zone Setting Detection Interval: 335 ms

## 2014-04-11 NOTE — Progress Notes (Signed)
ICD check in clinic. Normal device function. Threshold and sensing consistent with previous device measurements. Impedance trends stable over time. No evidence of any ventricular arrhythmias. Histogram distribution appropriate for patient and level of activity. Device programmed at appropriate safety margins---changed RV output from 2.0 to 2.5V. Changes per ST protocol. Device programmed to optimize intrinsic conduction. Estimated longevity 6.40yrs. Alert vibration demonstrated for patient, pt knows to call clinic if felt. Merlin/Analyze 07/11/14 & ROV w/ JA in 76mo.

## 2014-04-22 ENCOUNTER — Telehealth: Payer: Self-pay | Admitting: *Deleted

## 2014-04-22 NOTE — Telephone Encounter (Signed)
No communication with [employer]. Advised to reset bedside monitor. Tech support number given if assistance needed.

## 2014-05-27 ENCOUNTER — Telehealth: Payer: Self-pay | Admitting: *Deleted

## 2014-05-27 NOTE — Telephone Encounter (Signed)
No communication with Merlin. Advised to reset bedside monitor. Tech support number given if assistance needed. 

## 2014-06-13 ENCOUNTER — Telehealth: Payer: Self-pay | Admitting: *Deleted

## 2014-06-13 NOTE — Telephone Encounter (Signed)
No communication with Merlin. Advised to reset bedside monitor. Tech support number given if assistance needed. 

## 2014-07-11 ENCOUNTER — Ambulatory Visit (INDEPENDENT_AMBULATORY_CARE_PROVIDER_SITE_OTHER): Payer: Self-pay | Admitting: *Deleted

## 2014-07-11 ENCOUNTER — Telehealth: Payer: Self-pay | Admitting: Cardiology

## 2014-07-11 DIAGNOSIS — Z95810 Presence of automatic (implantable) cardiac defibrillator: Secondary | ICD-10-CM

## 2014-07-11 NOTE — Telephone Encounter (Signed)
LMOVM reminding pt to send remote transmission.   

## 2014-07-12 DIAGNOSIS — Z95810 Presence of automatic (implantable) cardiac defibrillator: Secondary | ICD-10-CM

## 2014-07-14 NOTE — Progress Notes (Signed)
Remote ICD transmission.   

## 2014-07-15 LAB — CUP PACEART REMOTE DEVICE CHECK
HighPow Impedance: 79 Ohm
Lead Channel Impedance Value: 380 Ohm
Lead Channel Setting Pacing Pulse Width: 0.5 ms
MDC IDC MSMT BATTERY REMAINING PERCENTAGE: 62 %
MDC IDC MSMT LEADCHNL RV SENSING INTR AMPL: 12 mV
MDC IDC PG SERIAL: 809738
MDC IDC SESS DTM: 20160513132703
MDC IDC SET LEADCHNL RV PACING AMPLITUDE: 2.5 V
MDC IDC SET LEADCHNL RV SENSING SENSITIVITY: 0.5 mV
MDC IDC STAT BRADY RV PERCENT PACED: 1 % — AB
Zone Setting Detection Interval: 280 ms
Zone Setting Detection Interval: 335 ms

## 2014-07-26 ENCOUNTER — Telehealth: Payer: Self-pay

## 2014-07-26 DIAGNOSIS — I5022 Chronic systolic (congestive) heart failure: Secondary | ICD-10-CM

## 2014-07-26 MED ORDER — SPIRONOLACTONE 25 MG PO TABS: 12.5000 mg | ORAL_TABLET | Freq: Every day | ORAL | Status: DC

## 2014-07-26 MED ORDER — LISINOPRIL 10 MG PO TABS: 10.0000 mg | ORAL_TABLET | Freq: Every day | ORAL | Status: DC

## 2014-07-26 MED ORDER — CARVEDILOL 25 MG PO TABS: 25.0000 mg | ORAL_TABLET | Freq: Two times a day (BID) | ORAL | Status: AC

## 2014-07-26 NOTE — Telephone Encounter (Signed)
Patient states he has not received his meds from the [place]. They were either not sent or lost in the mail. Requesting small local supply be sent to [facility] in [place].  Will send.

## 2014-07-27 ENCOUNTER — Encounter: Payer: Self-pay | Admitting: Cardiology

## 2014-08-03 ENCOUNTER — Encounter: Payer: Self-pay | Admitting: Internal Medicine

## 2014-11-13 NOTE — Progress Notes (Signed)
Electrophysiology Office Note Date: 11/14/2014  ID:  [name] [name], DOB 02-25-1961, MRN [id number]  PCP: Fredirick Maudlin, MD Primary Cardiologist: Excell Seltzer Electrophysiologist: Allred  CC: Routine ICD follow-up  [name] [name] is a 54 y.o. male seen today for Dr Johney Frame.  He presents today for routine electrophysiology followup.  Since last being seen in our clinic, the patient reports doing very well. He denies chest pain, palpitations, dyspnea, PND, orthopnea, nausea, vomiting, dizziness, syncope, edema, weight gain, or early satiety.  He has not had ICD shocks.   Device History: STJ single chamber ICD implanted 2012 for ICM History of appropriate therapy: No History of AAD therapy: No   Past Medical History  Diagnosis Date  . CAD (coronary artery disease)     anterior MI 2008, treated with bare metal stent to LAD  . MI (myocardial infarction)   . Ischemic cardiomyopathy     NYHA Class II/III CHF  . 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 HLD (hyperlipidemia)   . HTN (hypertension)   . GERD (gastroesophageal reflux disease)   . Umbilical hernia    Past Surgical History  Procedure Laterality Date  . Finger surgery  2/09    laceration repair  . Cardiac defibrillator placement  08/07/10    SJM Fortify VR ST implanted by Dr Johney Frame, part of the Analyze ST ICD study    Current Outpatient Prescriptions  Medication Sig Dispense Refill  . aspirin 325 MG tablet Take 325 mg by mouth daily.      . carvedilol (COREG) 25 MG tablet Take 1 tablet (25 mg total) by mouth 2 (two) times daily with a meal. 60 tablet 0  . cholecalciferol (VITAMIN D) 1000 UNITS tablet Take 1,000 Units by mouth daily.    . fish oil-omega-3 fatty acids 1000 MG capsule Take 1 g by mouth daily.     Marland Kitchen lisinopril (PRINIVIL,ZESTRIL) 10 MG tablet Take 1 tablet (10 mg total) by mouth daily. 30 tablet 0  . Multiple Vitamin (MULTIVITAMIN) tablet Take 1 tablet by mouth daily.      . nitroGLYCERIN  (NITROSTAT) 0.4 MG SL tablet Place 1 tablet (0.4 mg total) under the tongue every 5 (five) minutes as needed. 25 tablet 1  . simvastatin (ZOCOR) 80 MG tablet TAKE 1/2 TABLET (40 MG TOTAL) BY MOUTH AT BEDTIME.    Marland Kitchen spironolactone (ALDACTONE) 25 MG tablet Take 0.5 tablets (12.5 mg total) by mouth daily. 30 tablet 0   No current facility-administered medications for this visit.    Allergies:   Review of patient's allergies indicates no known allergies.   Social History: Social History   Social History  . Marital Status: Married    Spouse Name: N/A  . Number of Children: N/A  . Years of Education: N/A   Occupational History  . truck driver    Social History Main Topics  . Smoking status: Former Smoker    Types: Cigars    Quit date: 03/04/2006  . Smokeless tobacco: Not on file  . Alcohol Use: Yes     Comment: occasionally  . Drug Use: No  . Sexual Activity: Not on file   Other Topics Concern  . Not on file   Social History Narrative   Lives in [place] with spouse.  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    Family History: Family History  Problem Relation Age of Onset  . Heart failure    .  Diabetes    . Hypertension Mother   . Diabetes Mother   . Heart Problems Father     Review of Systems: General: No chills, fever, night sweats or weight changes  Cardiovascular:  No chest pain, dyspnea on exertion, edema, orthopnea, palpitations, paroxysmal nocturnal dyspnea; denies ICD shocks Dermatological: No rash, lesions or masses Respiratory: No cough, dyspnea Urologic: No hematuria, dysuria Abdominal: No nausea, vomiting, diarrhea, bright red blood per rectum, melena, or hematemesis Neurologic: No visual changes, weakness, changes in mental status All other systems reviewed and are otherwise negative except as noted above.   Physical Exam: VS:  BP 120/74 mmHg  Pulse 65  Ht 5\' 10"  (1.778 m)  Wt 239 lb 9.6  oz (108.682 kg)  BMI 34.38 kg/m2 , BMI Body mass index is 34.38 kg/(m^2).  GEN- The patient is well appearing, alert and oriented x 3 today.   HEENT: normocephalic, atraumatic; sclera clear, conjunctiva pink; hearing intact; oropharynx clear; neck supple, no JVP Lymph- no cervical lymphadenopathy Lungs- Clear to ausculation bilaterally, normal work of breathing.  No wheezes, rales, rhonchi Heart- Regular rate and rhythm, no murmurs, rubs or gallops, PMI not laterally displaced GI- soft, non-tender, non-distended, bowel sounds present, no hepatosplenomegaly Extremities- no clubbing, cyanosis, or edema; DP/PT/radial pulses 2+ bilaterally MS- no significant deformity or atrophy Skin- warm and dry, no rash or lesion; ICD pocket well healed Psych- euthymic mood, full affect Neuro- strength and sensation are intact  ICD interrogation- reviewed in detail today,  See PACEART report  EKG:  EKG is ordered today. The ekg ordered today shows sinus rhythm, rate 65, normal intervals  Recent Labs: No results found for requested labs within last 365 days.   Wt Readings from Last 3 Encounters:  11/14/14 239 lb 9.6 oz (108.682 kg)  12/23/13 231 lb 12.8 oz (105.144 kg)  10/25/13 231 lb (104.781 kg)     Other studies Reviewed: Additional studies/ records that were reviewed today include:Dr Excell Seltzer and Dr Jenel Lucks office notes  Assessment and Plan:  1.  Chronic systolic dysfunction euvolemic today Stable on an appropriate medical regimen Normal ICD function See Pace Art report No changes today  2.  ICM/CAD No recent ischemic symptoms Discussed with Dr Excell Seltzer - will decrease ASA to 81mg  daily today  3.  Obesity Weight loss encouraged today  4.  HTN Stable No change required today On Sprinolactone and Lisinopril - he reports that BMET is being followed by PCP    Current medicines are reviewed at length with the patient today.   The patient does not have concerns regarding his  medicines.  The following changes were made today:  none  Labs/ tests ordered today include: none   Disposition:   Follow up with Merlin transmissions, Dr Johney Frame in 1 year   Signed, Gypsy Balsam, NP 11/14/2014 10:51 AM  [facility] [address] [place] [address] [phone] (office) [phone] (fax)

## 2014-11-14 ENCOUNTER — Telehealth: Payer: Self-pay | Admitting: *Deleted

## 2014-11-14 ENCOUNTER — Ambulatory Visit (INDEPENDENT_AMBULATORY_CARE_PROVIDER_SITE_OTHER): Payer: Self-pay | Admitting: Nurse Practitioner

## 2014-11-14 ENCOUNTER — Encounter: Payer: Self-pay | Admitting: *Deleted

## 2014-11-14 ENCOUNTER — Encounter: Payer: Self-pay | Admitting: Nurse Practitioner

## 2014-11-14 VITALS — BP 120/74 | HR 65 | Ht 70.0 in | Wt 239.6 lb

## 2014-11-14 DIAGNOSIS — I5022 Chronic systolic (congestive) heart failure: Secondary | ICD-10-CM

## 2014-11-14 DIAGNOSIS — I10 Essential (primary) hypertension: Secondary | ICD-10-CM

## 2014-11-14 DIAGNOSIS — I255 Ischemic cardiomyopathy: Secondary | ICD-10-CM

## 2014-11-14 DIAGNOSIS — Z006 Encounter for examination for normal comparison and control in clinical research program: Secondary | ICD-10-CM

## 2014-11-14 LAB — CUP PACEART INCLINIC DEVICE CHECK
Date Time Interrogation Session: 20160912111156
Lead Channel Setting Pacing Amplitude: 2.5 V
Lead Channel Setting Pacing Pulse Width: 0.5 ms
MDC IDC PG SERIAL: 809738
MDC IDC SET LEADCHNL RV SENSING SENSITIVITY: 0.5 mV
Zone Setting Detection Interval: 280 ms
Zone Setting Detection Interval: 335 ms

## 2014-11-14 NOTE — Progress Notes (Signed)
Analyze ST Final Research Visit- Pt seen in office with Joice Lofts, NP. Device checked by industry and research. No ST alerts. No recent sustained arrhythmias. Research ST thresholds turned off per protocol. Pt released from research study Merlin and transferred to [facility] for ongoing remote monitoring.  EKG WNL reviewed by Amber, NP. BP 124/74 HR 65.

## 2014-11-14 NOTE — Patient Instructions (Addendum)
Medication Instructions:   START TAKING ASPIRIN 81 MG ONCE A DAY   STOP TAKING  ASPIRIN 325 MGONCE A DAY   Labwork:  NONE ORDER TODAY    Testing/Procedures:  NONE ORDER TODAY    Follow-Up:  Remote monitoring is used to monitor your Pacemaker of ICD from home. This monitoring reduces the number of office visits required to check your device to one time per year. It allows [place] to keep an eye on the functioning of your device to ensure it is working properly. You are scheduled for a device check from home on . 12   / 12   /  16      You may send your transmission at any time that day. If you have a wireless device, the transmission will be sent automatically. After your physician reviews your transmission, you will receive a postcard with your next transmission date.  Your physician wants you to follow-up in: ONE YEAR WITH DR [name] [name] will receive a reminder letter in the mail two months in advance. If you don't receive a letter, please call our office to schedule the follow-up appointment.     Any Other Special Instructions Will Be Listed Below (If Applicable).

## 2014-11-25 ENCOUNTER — Encounter: Payer: Self-pay | Admitting: Internal Medicine

## 2015-02-06 ENCOUNTER — Encounter: Payer: Self-pay | Admitting: Cardiovascular Disease

## 2015-02-06 ENCOUNTER — Ambulatory Visit (INDEPENDENT_AMBULATORY_CARE_PROVIDER_SITE_OTHER): Payer: Self-pay | Admitting: Cardiovascular Disease

## 2015-02-06 VITALS — BP 126/74 | HR 69 | Ht 70.0 in | Wt 240.0 lb

## 2015-02-06 DIAGNOSIS — I5022 Chronic systolic (congestive) heart failure: Secondary | ICD-10-CM

## 2015-02-06 NOTE — Progress Notes (Signed)
Cardiology Office Note Date:  02/06/2015   ID:  [name] [name], DOB May 10, 1960, MRN [id number]  PCP:  Fredirick Maudlin, MD  Cardiologist:  Tonny Bollman, MD    Chief Complaint  Patient presents with  . Congestive Heart Failure   History of Present Illness: [name] [name] is a 54 y.o. male who presents for for followup evaluation. The patient has coronary artery disease with history of an anterior MI in 2008 treated with bare metal stenting of the LAD. He has a severe ischemic cardiomyopathy with LVEF less than 30%. The patient has undergone ICD implantation. He presents today for followup evaluation. Most recent echocardiogram from 2012 demonstrated inferoapical, apical, and anteroseptal akinesis with an LVEF of 25-30%. There was mild mitral regurgitation.  The patient feels well. He hasn't been engaged in exercise or efforts at a prudent diet. Today, he denies symptoms of palpitations, chest pain, shortness of breath, orthopnea, PND, lower extremity edema, dizziness, or syncope.   Past Medical History  Diagnosis Date  . CAD (coronary artery disease)     anterior MI 2008, treated with bare metal stent to LAD  . MI (myocardial infarction) (HCC)   . Ischemic cardiomyopathy     NYHA Class II/III CHF  . 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 HLD (hyperlipidemia)   . HTN (hypertension)   . GERD (gastroesophageal reflux disease)   . Umbilical hernia     Past Surgical History  Procedure Laterality Date  . Finger surgery  2/09    laceration repair  . Cardiac defibrillator placement  08/07/10    SJM Fortify VR ST implanted by Dr Johney Frame, part of the Analyze ST ICD study    Current Outpatient Prescriptions  Medication Sig Dispense Refill  . aspirin 325 MG tablet Take 325 mg by mouth daily.      . carvedilol (COREG) 25 MG tablet Take 1 tablet (25 mg total) by mouth 2 (two) times daily with a meal. 60 tablet 0  . cholecalciferol (VITAMIN D) 1000 UNITS tablet Take 1,000  Units by mouth daily.    . fish oil-omega-3 fatty acids 1000 MG capsule Take 1 g by mouth daily.     Marland Kitchen lisinopril (PRINIVIL,ZESTRIL) 10 MG tablet Take 1 tablet (10 mg total) by mouth daily. 30 tablet 0  . Multiple Vitamin (MULTIVITAMIN) tablet Take 1 tablet by mouth daily.      . nitroGLYCERIN (NITROSTAT) 0.4 MG SL tablet Place 1 tablet (0.4 mg total) under the tongue every 5 (five) minutes as needed. 25 tablet 1  . simvastatin (ZOCOR) 80 MG tablet TAKE 1/2 TABLET (40 MG TOTAL) BY MOUTH AT BEDTIME.    Marland Kitchen spironolactone (ALDACTONE) 25 MG tablet Take 0.5 tablets (12.5 mg total) by mouth daily. 30 tablet 0   No current facility-administered medications for this visit.   Allergies:   Review of patient's allergies indicates no known allergies.   Social History:  The patient  reports that he quit smoking about 8 years ago. His smoking use included Cigars. He does not have any smokeless tobacco history on file. He reports that he drinks alcohol. He reports that he does not use illicit drugs.   Family History:  The patient's  family history includes Diabetes in his mother and another family member; Heart Problems in his father; Heart failure in an other family member; Hypertension in his mother.   ROS:  Please see the history of present illness.  All other systems are reviewed and negative.  PHYSICAL EXAM: VS:  BP 126/74 mmHg  Pulse 69  Ht 5\' 10"  (1.778 m)  Wt 240 lb (108.863 kg)  BMI 34.44 kg/m2 , BMI Body mass index is 34.44 kg/(m^2). GEN: Well nourished, well developed, in no acute distress HEENT: normal Neck: no JVD, no masses. No carotid bruits Cardiac: RRR without murmur or gallop                Respiratory:  clear to auscultation bilaterally, normal work of breathing GI: soft, nontender, nondistended, + BS MS: no deformity or atrophy Ext: no pretibial edema, pedal pulses 2+= bilaterally Skin: warm and dry, no rash Neuro:  Strength and sensation are intact Psych: euthymic mood, full  affect  EKG:  EKG is ordered today. The ekg ordered today shows NSR 69 bpm, age-indeterminate anterolateral MI, possible age-indeterminate inferior MI.   Recent Labs: No results found for requested labs within last 365 days.   Lipid Panel     Component Value Date/Time   CHOL 121 05/21/2010 1139   TRIG 95.0 05/21/2010 1139   HDL 34.60* 05/21/2010 1139   CHOLHDL 3 05/21/2010 1139   VLDL 19.0 05/21/2010 1139   LDLCALC 67 05/21/2010 1139      Wt Readings from Last 3 Encounters:  02/06/15 240 lb (108.863 kg)  11/14/14 239 lb 9.6 oz (108.682 kg)  12/23/13 231 lb 12.8 oz (105.144 kg)    ASSESSMENT AND PLAN: 1. Coronary artery disease, native vessel, with old MI. Large anterior infarct remotely without recurrence of angina.  He will continue on his current medical therapy which includes aspirin, beta blocker, ACE inhibitor, and a statin drug.  2. Chronic systolic heart failure, NYHA I, LVEF < 30% secondary to ischemic cardiomyopathy. He will continue on his current medical therapy which was reviewed today. There is no evidence of volume overload on exam.   3. Hyperlipidemia. The patient takes simvastatin through the [facility] in [place]. Labs followed there and medication dispensed from the [place]. Extensive dietary counseling done today.  4. Hypertension, essential. Blood pressure in range on carvedilol, lisinopril, and spironolactone.  5. Obesity with BMI greater than 30. We again discussed dietary strategies with strategies focused on starch reduction. Discussed importance of regular exercise 30-40 minutes 5-6 days per week.  Current medicines are reviewed with the patient today.  The patient does not have concerns regarding medicines.  Labs/ tests ordered today include:  No orders of the defined types were placed in this encounter.   Disposition:   FU one year  Signed, Tonny Bollman, MD  02/06/2015 9:55 AM    [facility] [address] [place],  [place], [place]  [address] Phone: [phone]; Fax: [phone]

## 2015-02-06 NOTE — Patient Instructions (Signed)
Medication Instructions:  Your physician recommends that you continue on your current medications as directed. Please refer to the Current Medication list given to you today.  Labwork: No new orders.   Testing/Procedures: No new orders.   Follow-Up: Your physician wants you to follow-up in: 1 YEAR with Dr Excell Seltzer.  You will receive a reminder letter in the mail two months in advance. If you don't receive a letter, please call our office to schedule the follow-up appointment.   Any Other Special Instructions Will Be Listed Below (If Applicable).     If you need a refill on your cardiac medications before your next appointment, please call your pharmacy.  Fat and Cholesterol Restricted Diet High levels of fat and cholesterol in your blood may lead to various health problems, such as diseases of the heart, blood vessels, gallbladder, liver, and pancreas. Fats are concentrated sources of energy that come in various forms. Certain types of fat, including saturated fat, may be harmful in excess. Cholesterol is a substance needed by your body in small amounts. Your body makes all the cholesterol it needs. Excess cholesterol comes from the food you eat. When you have high levels of cholesterol and saturated fat in your blood, health problems can develop because the excess fat and cholesterol will gather along the walls of your blood vessels, causing them to narrow. Choosing the right foods will help you control your intake of fat and cholesterol. This will help keep the levels of these substances in your blood within normal limits and reduce your risk of disease. WHAT IS MY PLAN? Your health care provider recommends that you:  Get no more than __________ % of the total calories in your daily diet from fat.  Limit your intake of saturated fat to less than ______% of your total calories each day.  Limit the amount of cholesterol in your diet to less than _________mg per day. WHAT TYPES OF FAT  SHOULD I CHOOSE?  Choose healthy fats more often. Choose monounsaturated and polyunsaturated fats, such as olive and canola oil, flaxseeds, walnuts, almonds, and seeds.  Eat more omega-3 fats. Good choices include salmon, mackerel, sardines, tuna, flaxseed oil, and ground flaxseeds. Aim to eat fish at least two times a week.  Limit saturated fats. Saturated fats are primarily found in animal products, such as meats, butter, and cream. Plant sources of saturated fats include palm oil, palm kernel oil, and coconut oil.  Avoid foods with partially hydrogenated oils in them. These contain trans fats. Examples of foods that contain trans fats are stick margarine, some tub margarines, cookies, crackers, and other baked goods. WHAT GENERAL GUIDELINES DO I NEED TO FOLLOW? These guidelines for healthy eating will help you control your intake of fat and cholesterol:  Check food labels carefully to identify foods with trans fats or high amounts of saturated fat.  Fill one half of your plate with vegetables and green salads.  Fill one fourth of your plate with whole grains. Look for the word "whole" as the first word in the ingredient list.  Fill one fourth of your plate with lean protein foods.  Limit fruit to two servings a day. Choose fruit instead of juice.  Eat more foods that contain soluble fiber. Examples of foods that contain this type of fiber are apples, broccoli, carrots, beans, peas, and barley. Aim to get 20-30 g of fiber per day.  Eat more home-cooked food and less restaurant, buffet, and fast food.  Limit or avoid alcohol.  Limit foods high in starch and sugar.  Limit fried foods.  Cook foods using methods other than frying. Baking, boiling, grilling, and broiling are all great options.  Lose weight if you are overweight. Losing just 5-10% of your initial body weight can help your overall health and prevent diseases such as diabetes and heart disease. WHAT FOODS CAN I  EAT? Grains Whole grains, such as whole wheat or whole grain breads, crackers, cereals, and pasta. Unsweetened oatmeal, bulgur, barley, quinoa, or brown rice. Corn or whole wheat flour tortillas. Vegetables Fresh or frozen vegetables (raw, steamed, roasted, or grilled). Green salads. Fruits All fresh, canned (in natural juice), or frozen fruits. Meat and Other Protein Products Ground beef (85% or leaner), grass-fed beef, or beef trimmed of fat. Skinless chicken or [place]. Ground chicken or [place]. Pork trimmed of fat. All fish and seafood. Eggs. Dried beans, peas, or lentils. Unsalted nuts or seeds. Unsalted canned or dry beans. Dairy Low-fat dairy products, such as skim or 1% milk, 2% or reduced-fat cheeses, low-fat ricotta or cottage cheese, or plain low-fat yogurt. Fats and Oils Tub margarines without trans fats. Light or reduced-fat mayonnaise and salad dressings. Avocado. Olive, canola, sesame, or safflower oils. Natural peanut or almond butter (choose ones without added sugar and oil). The items listed above may not be a complete list of recommended foods or beverages. Contact your dietitian for more options. WHAT FOODS ARE NOT RECOMMENDED? Grains White bread. White pasta. White rice. Cornbread. Bagels, pastries, and croissants. Crackers that contain trans fat. Vegetables White potatoes. Corn. Creamed or fried vegetables. Vegetables in a cheese sauce. Fruits Dried fruits. Canned fruit in light or heavy syrup. Fruit juice. Meat and Other Protein Products Fatty cuts of meat. Ribs, chicken wings, bacon, sausage, bologna, salami, chitterlings, fatback, hot dogs, bratwurst, and packaged luncheon meats. Liver and organ meats. Dairy Whole or 2% milk, cream, half-and-half, and cream cheese. Whole milk cheeses. Whole-fat or sweetened yogurt. Full-fat cheeses. Nondairy creamers and whipped toppings. Processed cheese, cheese spreads, or cheese curds. Sweets and Desserts Corn syrup, sugars,  honey, and molasses. Candy. Jam and jelly. Syrup. Sweetened cereals. Cookies, pies, cakes, donuts, muffins, and ice cream. Fats and Oils Butter, stick margarine, lard, shortening, ghee, or bacon fat. Coconut, palm kernel, or palm oils. Beverages Alcohol. Sweetened drinks (such as sodas, lemonade, and fruit drinks or punches). The items listed above may not be a complete list of foods and beverages to avoid. Contact your dietitian for more information.   This information is not intended to replace advice given to you by your health care provider. Make sure you discuss any questions you have with your health care provider.   Document Released: 02/18/2005 Document Revised: 03/11/2014 Document Reviewed: 05/19/2013 Elsevier Interactive Patient Education [employer]

## 2015-02-07 NOTE — Addendum Note (Signed)
Addended by: Reesa Chew on: 02/07/2015 05:46 PM   Modules accepted: Orders

## 2015-02-13 ENCOUNTER — Ambulatory Visit (INDEPENDENT_AMBULATORY_CARE_PROVIDER_SITE_OTHER): Payer: Self-pay | Admitting: *Deleted

## 2015-02-13 DIAGNOSIS — I255 Ischemic cardiomyopathy: Secondary | ICD-10-CM

## 2015-02-13 NOTE — Progress Notes (Signed)
Remote ICD transmission.   

## 2015-02-17 LAB — CUP PACEART REMOTE DEVICE CHECK
Battery Remaining Longevity: 66 mo
Battery Remaining Percentage: 57 %
HIGH POWER IMPEDANCE MEASURED VALUE: 75 Ohm
Implantable Lead Location: 753860
Lead Channel Impedance Value: 390 Ohm
MDC IDC LEAD IMPLANT DT: 20120605
MDC IDC MSMT LEADCHNL RV SENSING INTR AMPL: 12 mV
MDC IDC PG SERIAL: 809738
MDC IDC SESS DTM: 20161216121345
MDC IDC SET LEADCHNL RV PACING AMPLITUDE: 2.5 V
MDC IDC SET LEADCHNL RV PACING PULSEWIDTH: 0.5 ms
MDC IDC SET LEADCHNL RV SENSING SENSITIVITY: 0.5 mV
MDC IDC STAT BRADY RV PERCENT PACED: 1 % — AB

## 2015-02-22 ENCOUNTER — Encounter: Payer: Self-pay | Admitting: Cardiology

## 2015-03-08 NOTE — Telephone Encounter (Signed)
lmovm to contact Dionicio Shelnutt to make sure messaged was recieved about aspirin per Dr. Excell Seltzer and Joice Lofts NP can be reduced from 325 mg to 81 mg

## 2015-03-08 NOTE — Telephone Encounter (Signed)
[name] [name] returned call to confirm he got message frm mlovm about decreasing asa 325 to 81 mg

## 2015-05-15 ENCOUNTER — Ambulatory Visit (INDEPENDENT_AMBULATORY_CARE_PROVIDER_SITE_OTHER): Payer: Self-pay | Admitting: *Deleted

## 2015-05-15 DIAGNOSIS — I255 Ischemic cardiomyopathy: Secondary | ICD-10-CM

## 2015-05-16 NOTE — Progress Notes (Signed)
Remote ICD transmission.   

## 2015-06-12 LAB — CUP PACEART REMOTE DEVICE CHECK
HIGH POWER IMPEDANCE MEASURED VALUE: 74 Ohm
Implantable Lead Location: 753860
Lead Channel Impedance Value: 400 Ohm
Lead Channel Setting Pacing Amplitude: 2.5 V
Lead Channel Setting Pacing Pulse Width: 0.5 ms
Lead Channel Setting Sensing Sensitivity: 0.5 mV
MDC IDC LEAD IMPLANT DT: 20120605
MDC IDC MSMT LEADCHNL RV SENSING INTR AMPL: 12 mV
MDC IDC SESS DTM: 20170410103208
MDC IDC STAT BRADY RV PERCENT PACED: 1 % — AB
Pulse Gen Serial Number: 809738

## 2015-06-13 ENCOUNTER — Encounter: Payer: Self-pay | Admitting: Cardiology

## 2015-08-14 ENCOUNTER — Ambulatory Visit (INDEPENDENT_AMBULATORY_CARE_PROVIDER_SITE_OTHER): Payer: Self-pay | Admitting: *Deleted

## 2015-08-14 DIAGNOSIS — I255 Ischemic cardiomyopathy: Secondary | ICD-10-CM

## 2015-08-14 NOTE — Progress Notes (Signed)
Remote ICD transmission.   

## 2015-08-21 LAB — CUP PACEART REMOTE DEVICE CHECK
HIGH POWER IMPEDANCE MEASURED VALUE: 73 Ohm
Lead Channel Impedance Value: 380 Ohm
Lead Channel Sensing Intrinsic Amplitude: 12 mV
Lead Channel Setting Pacing Amplitude: 2.5 V
Lead Channel Setting Sensing Sensitivity: 0.5 mV
MDC IDC LEAD IMPLANT DT: 20120605
MDC IDC LEAD LOCATION: 753860
MDC IDC PG SERIAL: 809738
MDC IDC SESS DTM: 20170619144841
MDC IDC SET LEADCHNL RV PACING PULSEWIDTH: 0.5 ms
MDC IDC STAT BRADY RV PERCENT PACED: 1 % — AB

## 2015-08-24 ENCOUNTER — Encounter: Payer: Self-pay | Admitting: Cardiology

## 2015-10-25 ENCOUNTER — Encounter: Payer: Self-pay | Admitting: *Deleted

## 2015-11-08 ENCOUNTER — Encounter (INDEPENDENT_AMBULATORY_CARE_PROVIDER_SITE_OTHER): Payer: Self-pay

## 2015-11-08 ENCOUNTER — Ambulatory Visit (INDEPENDENT_AMBULATORY_CARE_PROVIDER_SITE_OTHER): Payer: Self-pay | Admitting: Internal Medicine

## 2015-11-08 ENCOUNTER — Encounter: Payer: Self-pay | Admitting: Internal Medicine

## 2015-11-08 VITALS — BP 120/84 | HR 65 | Ht 70.0 in | Wt 236.0 lb

## 2015-11-08 DIAGNOSIS — I429 Cardiomyopathy, unspecified: Secondary | ICD-10-CM

## 2015-11-08 DIAGNOSIS — I519 Heart disease, unspecified: Secondary | ICD-10-CM

## 2015-11-08 DIAGNOSIS — I428 Other cardiomyopathies: Secondary | ICD-10-CM

## 2015-11-08 LAB — CUP PACEART INCLINIC DEVICE CHECK
Battery Remaining Longevity: 58.8
Brady Statistic RV Percent Paced: 0 %
Date Time Interrogation Session: 20170906122521
HighPow Impedance: 79.875
Implantable Lead Implant Date: 20120605
Implantable Lead Location: 753860
Lead Channel Pacing Threshold Amplitude: 1 V
Lead Channel Pacing Threshold Pulse Width: 0.5 ms
MDC IDC MSMT LEADCHNL RV IMPEDANCE VALUE: 412.5 Ohm
MDC IDC MSMT LEADCHNL RV SENSING INTR AMPL: 12 mV
MDC IDC SET LEADCHNL RV PACING AMPLITUDE: 2.5 V
MDC IDC SET LEADCHNL RV PACING PULSEWIDTH: 0.5 ms
MDC IDC SET LEADCHNL RV SENSING SENSITIVITY: 0.5 mV
Pulse Gen Serial Number: 809738

## 2015-11-08 NOTE — Progress Notes (Signed)
PCP: Frederich ChickFOSTER Jr., ROBERT M, MD Primary Cardiologist:  Dr Trish Fountainooper  [name] [name] is a 55 y.o. male who presents today for routine electrophysiology followup.  Since last being seen in our clinic, the patient reports doing very well.  He remains active. Today, he denies symptoms of palpitations, chest pain, shortness of breath,  lower extremity edema, dizziness, presyncope, syncope, or ICD shocks.  The patient is otherwise without complaint today.   Past Medical History:  Diagnosis Date  . CAD (coronary artery disease)    anterior MI 2008, treated with bare metal stent to LAD  . GERD (gastroesophageal reflux disease)   . HLD (hyperlipidemia)   . HTN (hypertension)   . Ischemic cardiomyopathy    NYHA Class II/III CHF  . MI (myocardial infarction) (HCC)   . 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 Umbilical hernia    Past Surgical History:  Procedure Laterality Date  . CARDIAC DEFIBRILLATOR PLACEMENT  08/07/10   SJM Fortify VR ST implanted by Dr Johney FrameAllred, part of the Analyze ST ICD study  . FINGER SURGERY  2/09   laceration repair    Current Outpatient Prescriptions  Medication Sig Dispense Refill  . aspirin 325 MG tablet Take 325 mg by mouth daily.      . carvedilol (COREG) 25 MG tablet Take 1 tablet (25 mg total) by mouth 2 (two) times daily with a meal. 60 tablet 0  . cholecalciferol (VITAMIN D) 1000 UNITS tablet Take 1,000 Units by mouth daily.    . fish oil-omega-3 fatty acids 1000 MG capsule Take 1 g by mouth daily.     Marland Kitchen. lisinopril (PRINIVIL,ZESTRIL) 10 MG tablet Take 1 tablet (10 mg total) by mouth daily. 30 tablet 0  . Multiple Vitamin (MULTIVITAMIN) tablet Take 1 tablet by mouth daily.      . nitroGLYCERIN (NITROSTAT) 0.4 MG SL tablet Place 0.4 mg under the tongue every 5 (five) minutes x 3 doses as needed for chest pain.    . simvastatin (ZOCOR) 80 MG tablet TAKE 1/2 TABLET (40 MG TOTAL) BY MOUTH AT BEDTIME.    Marland Kitchen. spironolactone (ALDACTONE) 25 MG tablet Take 0.5 tablets  (12.5 mg total) by mouth daily. 30 tablet 0   No current facility-administered medications for this visit.     Physical Exam: Vitals:   11/08/15 0818  BP: 120/84  Pulse: 65  Weight: 236 lb (107 kg)  Height: 5\' 10"  (1.778 m)    GEN- The patient is well appearing, alert and oriented x 3 today.   Head- normocephalic, atraumatic Eyes-  Sclera clear, conjunctiva pink Ears- hearing intact Oropharynx- clear Lungs- Clear to ausculation bilaterally, normal work of breathing Chest- ICD pocket is well healed Heart- Regular rate and rhythm, no murmurs, rubs or gallops, PMI not laterally displaced GI- soft, NT, ND, + BS Extremities- no clubbing, cyanosis, or edema  ICD interrogation- reviewed in detail today,  See PACEART report ekg today reveals sinus rhythm anterolateral infarct pattern  Assessment and Plan:  1. CAD/ ischemic CM No ischemic symptoms No symptoms of CHF  Normal ICD function See Pace Art report No changes today  I have discussed SJM Fortify Assura advisary with the patient today. He understands that recommendation from SJM is to not replace the device at this time. The patient is not device dependant.  The patient has not had appropriate device therapy in the past or implanted for secondary prevention.  Vibratory alert demonstrated today.  He is actively remotely monitored and understands  the importance of compliance today.    Merlin Return in 1 year to see EP NP  Hillis Range MD, [facility] 11/08/2015 8:44 AM

## 2015-11-08 NOTE — Patient Instructions (Signed)
Medication Instructions:  Your physician recommends that you continue on your current medications as directed. Please refer to the Current Medication list given to you today.   Labwork: None ordered   Testing/Procedures: None ordered   Follow-Up: Your physician wants you to follow-up in:12 months with Gypsy Balsam, NP You will receive a reminder letter in the mail two months in advance. If you don't receive a letter, please call our office to schedule the follow-up appointment.  Remote monitoring is used to monitor your Pacemaker of ICD from home. This monitoring reduces the number of office visits required to check your device to one time per year. It allows [place] to keep an eye on the functioning of your device to ensure it is working properly. You are scheduled for a device check from home on 02/07/16 You may send your transmission at any time that day. If you have a wireless device, the transmission will be sent automatically. After your physician reviews your transmission, you will receive a postcard with your next transmission date.     Any Other Special Instructions Will Be Listed Below (If Applicable).     If you need a refill on your cardiac medications before your next appointment, please call your pharmacy.

## 2016-02-07 ENCOUNTER — Ambulatory Visit (INDEPENDENT_AMBULATORY_CARE_PROVIDER_SITE_OTHER): Payer: Self-pay | Admitting: *Deleted

## 2016-02-07 DIAGNOSIS — I428 Other cardiomyopathies: Secondary | ICD-10-CM

## 2016-02-07 NOTE — Progress Notes (Signed)
Remote ICD transmission.   

## 2016-02-14 ENCOUNTER — Encounter: Payer: Self-pay | Admitting: Cardiology

## 2016-03-05 LAB — CUP PACEART REMOTE DEVICE CHECK
Implantable Lead Location: 753860
Implantable Pulse Generator Implant Date: 20120605
MDC IDC LEAD IMPLANT DT: 20120605
MDC IDC PG SERIAL: 809738
MDC IDC SESS DTM: 20180102150232

## 2016-03-28 ENCOUNTER — Telehealth: Payer: Self-pay | Admitting: Cardiology

## 2016-03-28 NOTE — Telephone Encounter (Signed)
Spoke w/ pt and requested that he send a manual transmission b/c his home monitor has not updated in at least 7 days.   

## 2016-04-04 ENCOUNTER — Telehealth: Payer: Self-pay | Admitting: Cardiology

## 2016-04-04 NOTE — Telephone Encounter (Signed)
LMOVM requesting that pt send manual transmission b/c home monitor has not updated in at least 7 days.    

## 2016-04-11 ENCOUNTER — Telehealth: Payer: Self-pay | Admitting: Cardiology

## 2016-04-11 NOTE — Telephone Encounter (Signed)
Spoke w/ pt and requested that he send a manual transmission b/c his home monitor has not updated in at least 14 days. Pt home monitor mal functioned. Gave him the number to tech support to call and get help fixing the monitor.

## 2016-05-08 ENCOUNTER — Ambulatory Visit (INDEPENDENT_AMBULATORY_CARE_PROVIDER_SITE_OTHER): Payer: Self-pay | Admitting: *Deleted

## 2016-05-08 ENCOUNTER — Telehealth: Payer: Self-pay | Admitting: Cardiology

## 2016-05-08 DIAGNOSIS — I428 Other cardiomyopathies: Secondary | ICD-10-CM

## 2016-05-08 NOTE — Telephone Encounter (Signed)
Confirmed remote transmission w/ pt wife.   

## 2016-05-14 NOTE — Progress Notes (Signed)
Remote ICD transmission.   

## 2016-05-15 ENCOUNTER — Encounter: Payer: Self-pay | Admitting: Cardiology

## 2016-05-20 LAB — CUP PACEART REMOTE DEVICE CHECK
Brady Statistic RV Percent Paced: 1 % — CL
Date Time Interrogation Session: 20180319090038
HighPow Impedance: 75 Ohm
Implantable Lead Implant Date: 20120605
MDC IDC LEAD LOCATION: 753860
MDC IDC MSMT LEADCHNL RV IMPEDANCE VALUE: 390 Ohm
MDC IDC MSMT LEADCHNL RV SENSING INTR AMPL: 12 mV
MDC IDC PG IMPLANT DT: 20120605
MDC IDC PG SERIAL: 809738

## 2016-07-01 ENCOUNTER — Telehealth: Payer: Self-pay | Admitting: Cardiology

## 2016-07-01 NOTE — Telephone Encounter (Signed)
Spoke w/ pt wife b/c pt home monitor has not updated in the last 7 days. Requested that pt send a remote transmission. Pt wife verbalized understanding.

## 2016-07-19 ENCOUNTER — Encounter: Payer: Self-pay | Admitting: Cardiology

## 2016-07-19 ENCOUNTER — Telehealth: Payer: Self-pay | Admitting: Cardiology

## 2016-07-19 NOTE — Progress Notes (Signed)
Letter not needed / called pt

## 2016-07-19 NOTE — Telephone Encounter (Signed)
Attempted to call pt b/c his home monitor has not updated in the last 7 days. No answer and unable to leave a message.   

## 2016-07-26 ENCOUNTER — Telehealth: Payer: Self-pay | Admitting: Cardiology

## 2016-07-26 NOTE — Telephone Encounter (Signed)
Spoke w/ pt and requested that he send a remote transmission b/c his home monitor has not updated in the last 7 days. Pt verbalized understanding.

## 2016-08-13 ENCOUNTER — Ambulatory Visit (INDEPENDENT_AMBULATORY_CARE_PROVIDER_SITE_OTHER): Payer: Self-pay | Admitting: *Deleted

## 2016-08-13 DIAGNOSIS — I428 Other cardiomyopathies: Secondary | ICD-10-CM

## 2016-08-13 NOTE — Progress Notes (Signed)
Remote ICD transmission.   

## 2016-08-14 ENCOUNTER — Encounter: Payer: Self-pay | Admitting: Cardiology

## 2016-08-14 LAB — CUP PACEART REMOTE DEVICE CHECK
Date Time Interrogation Session: 20180613140834
HIGH POWER IMPEDANCE MEASURED VALUE: 77 Ohm
Implantable Lead Implant Date: 20120605
Implantable Lead Location: 753860
Implantable Pulse Generator Implant Date: 20120605
Lead Channel Impedance Value: 410 Ohm
Lead Channel Pacing Threshold Amplitude: 1 V
Lead Channel Sensing Intrinsic Amplitude: 12 mV
MDC IDC MSMT LEADCHNL RV PACING THRESHOLD PULSEWIDTH: 0.5 ms
MDC IDC STAT BRADY RV PERCENT PACED: 1 % — AB
Pulse Gen Serial Number: 809738

## 2016-09-30 ENCOUNTER — Telehealth: Payer: Self-pay | Admitting: Cardiology

## 2016-09-30 NOTE — Telephone Encounter (Signed)
Spoke w/ pt and requested that he send a manual transmission b/c his home monitor has not updated in at least 7 days.   

## 2016-11-12 ENCOUNTER — Telehealth: Payer: Self-pay | Admitting: Cardiology

## 2016-11-12 ENCOUNTER — Ambulatory Visit (INDEPENDENT_AMBULATORY_CARE_PROVIDER_SITE_OTHER): Payer: Self-pay | Admitting: *Deleted

## 2016-11-12 DIAGNOSIS — I428 Other cardiomyopathies: Secondary | ICD-10-CM

## 2016-11-12 NOTE — Telephone Encounter (Signed)
Spoke with pt and reminded pt of remote transmission that is due today. Pt verbalized understanding.   

## 2016-11-18 NOTE — Progress Notes (Signed)
Remote ICD transmission.   

## 2016-11-20 LAB — CUP PACEART REMOTE DEVICE CHECK
Implantable Pulse Generator Implant Date: 20120605
MDC IDC LEAD IMPLANT DT: 20120605
MDC IDC LEAD LOCATION: 753860
MDC IDC SESS DTM: 20180919084636
Pulse Gen Serial Number: 809738

## 2016-11-22 ENCOUNTER — Encounter: Payer: Self-pay | Admitting: Cardiology

## 2016-11-25 ENCOUNTER — Telehealth: Payer: Self-pay | Admitting: Cardiology

## 2016-11-25 NOTE — Telephone Encounter (Signed)
Spoke w/ pt and requested that he send a manual transmission b/c his home monitor has not updated in at least 7 days.   

## 2016-11-29 ENCOUNTER — Encounter: Payer: Self-pay | Admitting: Nurse Practitioner

## 2016-12-07 NOTE — Progress Notes (Signed)
Electrophysiology Office Note Date: 12/10/2016  ID:  [name] [name], DOB [birth year], MRN [id number]  PCP: Frederich Chick., MD Primary Cardiologist: Excell Seltzer Electrophysiologist: Allred  CC: Routine ICD follow-up  [name] [name] is a 56 y.o. male seen today for Dr Johney Frame.  He presents today for routine electrophysiology followup.  Since last being seen in our clinic, the patient reports doing very well. He denies chest pain, palpitations, dyspnea, PND, orthopnea, nausea, vomiting, dizziness, syncope, edema, weight gain, or early satiety.  He has not had ICD shocks.   Device History: STJ single chamber ICD implanted 2012 for ICM History of appropriate therapy: No History of AAD therapy: No   Past Medical History:  Diagnosis Date  . CAD (coronary artery disease)    anterior MI 2008, treated with bare metal stent to LAD  . GERD (gastroesophageal reflux disease)   . HLD (hyperlipidemia)   . HTN (hypertension)   . Ischemic cardiomyopathy    NYHA Class II/III CHF  . MI (myocardial infarction) (HCC)   . 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 Umbilical hernia    Past Surgical History:  Procedure Laterality Date  . CARDIAC DEFIBRILLATOR PLACEMENT  08/07/10   SJM Fortify VR ST implanted by Dr Johney Frame, part of the Analyze ST ICD study  . FINGER SURGERY  2/09   laceration repair    Current Outpatient Prescriptions  Medication Sig Dispense Refill  . aspirin 81 MG tablet Take 81 mg by mouth once.     . carvedilol (COREG) 25 MG tablet Take 1 tablet (25 mg total) by mouth 2 (two) times daily with a meal. 60 tablet 0  . cholecalciferol (VITAMIN D) 1000 UNITS tablet Take 1,000 Units by mouth daily.    . fish oil-omega-3 fatty acids 1000 MG capsule Take 1 g by mouth daily.     Marland Kitchen lisinopril (PRINIVIL,ZESTRIL) 10 MG tablet Take 1 tablet (10 mg total) by mouth daily. 30 tablet 0  . Multiple Vitamin (MULTIVITAMIN) tablet Take 1 tablet by mouth daily.      . nitroGLYCERIN  (NITROSTAT) 0.4 MG SL tablet Place 0.4 mg under the tongue every 5 (five) minutes x 3 doses as needed for chest pain.    . simvastatin (ZOCOR) 80 MG tablet TAKE 1/2 TABLET (40 MG TOTAL) BY MOUTH AT BEDTIME.    Marland Kitchen spironolactone (ALDACTONE) 25 MG tablet Take 0.5 tablets (12.5 mg total) by mouth daily. 30 tablet 0   No current facility-administered medications for this visit.     Allergies:   Patient has no known allergies.   Social History: Social History   Social History  . Marital status: Married    Spouse name: N/A  . Number of children: N/A  . Years of education: N/A   Occupational History  . truck driver    Social History Main Topics  . Smoking status: Former Smoker    Types: Cigars    Quit date: 03/04/2006  . Smokeless tobacco: Never Used  . Alcohol use Yes     Comment: occasionally  . Drug use: No  . Sexual activity: Not on file   Other Topics Concern  . Not on file   Social History Narrative   Lives in [place] with spouse.  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    Family History: Family History  Problem Relation Age of Onset  . Hypertension Mother   . Diabetes Mother   .  Heart Problems Father   . Heart failure Unknown   . Diabetes Unknown     Review of Systems: All other systems reviewed and are otherwise negative except as noted above.   Physical Exam: VS:  BP 122/70   Pulse 68   Ht 5\' 10"  (1.778 m)   Wt 235 lb (106.6 kg)   BMI 33.72 kg/m  , BMI Body mass index is 33.72 kg/m.  GEN- The patient is well appearing, alert and oriented x 3 today.   HEENT: normocephalic, atraumatic; sclera clear, conjunctiva pink; hearing intact; oropharynx clear; neck supple Lungs- Clear to ausculation bilaterally, normal work of breathing.  No wheezes, rales, rhonchi Heart- Regular rate and rhythm  GI- soft, non-tender, non-distended, bowel sounds present Extremities- no clubbing, cyanosis, or edema    MS- no significant deformity or atrophy Skin- warm and dry, no rash or lesion; ICD pocket well healed Psych- euthymic mood, full affect Neuro- strength and sensation are intact  ICD interrogation- reviewed in detail today,  See PACEART report  EKG:  EKG is not ordered today.  Recent Labs: No results found for requested labs within last 8760 hours.   Wt Readings from Last 3 Encounters:  12/10/16 235 lb (106.6 kg)  11/08/15 236 lb (107 kg)  02/06/15 240 lb (108.9 kg)     Other studies Reviewed: Additional studies/ records that were reviewed today include:Dr Excell Seltzer and Dr Jenel Lucks office notes  Assessment and Plan:  1.  Chronic systolic dysfunction euvolemic today Stable on an appropriate medical regimen Normal ICD function See Pace Art report No changes today I have discussed SJM Fortify Assura advisary with the patient today. He understands that recommendation from SJM is to not replace the device at this time. The patient is not device dependant.  The patient has not had appropriate device therapy in the past or implanted for secondary prevention.  Vibratory alert demonstrated today.  He is actively remotely monitored and understands the importance of compliance today.  Software update installed today  2.  ICM/CAD No recent ischemic symptoms Continue current therapy   3.  Obesity Body mass index is 33.72 kg/m. Weight loss encouarged  4.  HTN Stable No change required today   Current medicines are reviewed at length with the patient today.   The patient does not have concerns regarding his medicines.  The following changes were made today:  none  Labs/ tests ordered today include: none   Disposition:   Follow up with Merlin transmissions, Dr Johney Frame in 1 year   Signed, Gypsy Balsam, NP 12/10/2016 12:56 PM  [facility] [address] [place] [address] [phone] (office) [phone] (fax)

## 2016-12-10 ENCOUNTER — Ambulatory Visit (INDEPENDENT_AMBULATORY_CARE_PROVIDER_SITE_OTHER): Payer: Self-pay | Admitting: Nurse Practitioner

## 2016-12-10 ENCOUNTER — Encounter: Payer: Self-pay | Admitting: Nurse Practitioner

## 2016-12-10 VITALS — BP 122/70 | HR 68 | Ht 70.0 in | Wt 235.0 lb

## 2016-12-10 DIAGNOSIS — I5022 Chronic systolic (congestive) heart failure: Secondary | ICD-10-CM

## 2016-12-10 DIAGNOSIS — I255 Ischemic cardiomyopathy: Secondary | ICD-10-CM

## 2016-12-10 DIAGNOSIS — I110 Hypertensive heart disease with heart failure: Secondary | ICD-10-CM

## 2016-12-10 NOTE — Patient Instructions (Signed)
Medication Instructions:   START TAKING ASPIRIN 81 MG ONCE A DAY   If you need a refill on your cardiac medications before your next appointment, please call your pharmacy.  Labwork: NONE ORDERED  TODAY    Testing/Procedures: NONE ORDERED  TODAY    Follow-Up:  Your physician wants you to follow-up in: ONE YEAR WITH  ALLRED  You will receive a reminder letter in the mail two months in advance. If you don't receive a letter, please call our office to schedule the follow-up appointment.  Remote monitoring is used to monitor your Pacemaker of ICD from home. This monitoring reduces the number of office visits required to check your device to one time per year. It allows [place] to keep an eye on the functioning of your device to ensure it is working properly. You are scheduled for a device check from home on . You may send your transmission at any time that day. If you have a wireless device, the transmission will be sent automatically. After your physician reviews your transmission, you will receive a postcard with your next transmission date.     Any Other Special Instructions Will Be Listed Below (If Applicable).

## 2016-12-11 ENCOUNTER — Encounter: Payer: Self-pay | Admitting: Nurse Practitioner

## 2017-02-06 ENCOUNTER — Encounter: Payer: Self-pay | Admitting: Cardiovascular Disease

## 2017-02-06 ENCOUNTER — Encounter (INDEPENDENT_AMBULATORY_CARE_PROVIDER_SITE_OTHER): Payer: Self-pay

## 2017-02-06 ENCOUNTER — Ambulatory Visit (INDEPENDENT_AMBULATORY_CARE_PROVIDER_SITE_OTHER): Payer: Self-pay | Admitting: Cardiovascular Disease

## 2017-02-06 VITALS — BP 138/84 | HR 64 | Ht 70.0 in | Wt 239.4 lb

## 2017-02-06 DIAGNOSIS — I2510 Atherosclerotic heart disease of native coronary artery without angina pectoris: Secondary | ICD-10-CM

## 2017-02-06 DIAGNOSIS — I10 Essential (primary) hypertension: Secondary | ICD-10-CM

## 2017-02-06 DIAGNOSIS — I5022 Chronic systolic (congestive) heart failure: Secondary | ICD-10-CM

## 2017-02-06 NOTE — Patient Instructions (Signed)

## 2017-02-06 NOTE — Progress Notes (Signed)
Cardiology Office Note Date:  02/06/2017   ID:  [name] [name], DOB 05-10-1960, MRN [id number]  PCP:  Frederich Chick., MD  Cardiologist:  Tonny Bollman, MD    Chief Complaint  Patient presents with  . Follow-up    CHF     History of Present Illness: [name] [name] is a 56 y.o. male who presents for follow-up evaluation.  The patient is followed for coronary artery disease with history of a large anterior MI in 2008 treated with bare-metal stenting of the LAD complicated by severe ischemic cardiomyopathy with LVEF less than 30%.  He underwent ICD implantation for primary prevention of sudden cardiac death and has had no device discharges.  He has never been hospitalized for congestive heart failure.  The patient is here alone today.  He has been doing well.  He was recently out in [place] visiting his son who is in [employer].  The patient reports no chest pain, chest pressure, shortness of breath, leg swelling, orthopnea, or PND.  He has had no palpitations, lightheadedness, or syncope.  He brings in labs from the [place] system today-see below for lab values.  Past Medical History:  Diagnosis Date  . CAD (coronary artery disease)    anterior MI 2008, treated with bare metal stent to LAD  . GERD (gastroesophageal reflux disease)   . HLD (hyperlipidemia)   . HTN (hypertension)   . Ischemic cardiomyopathy    NYHA Class II/III CHF  . MI (myocardial infarction) (HCC)   . 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 Umbilical hernia     Past Surgical History:  Procedure Laterality Date  . CARDIAC DEFIBRILLATOR PLACEMENT  08/07/10   SJM Fortify VR ST implanted by Dr Johney Frame, part of the Analyze ST ICD study  . FINGER SURGERY  2/09   laceration repair    Current Outpatient Medications  Medication Sig Dispense Refill  . aspirin 81 MG tablet Take 81 mg by mouth once.     . carvedilol (COREG) 25 MG tablet Take 1 tablet (25 mg total) by mouth 2 (two) times daily with a meal.  60 tablet 0  . cholecalciferol (VITAMIN D) 1000 UNITS tablet Take 1,000 Units by mouth daily.    . fish oil-omega-3 fatty acids 1000 MG capsule Take 1 g by mouth daily.     Marland Kitchen lisinopril (PRINIVIL,ZESTRIL) 10 MG tablet Take 1 tablet (10 mg total) by mouth daily. 30 tablet 0  . Multiple Vitamin (MULTIVITAMIN) tablet Take 1 tablet by mouth daily.      . nitroGLYCERIN (NITROSTAT) 0.4 MG SL tablet Place 0.4 mg under the tongue every 5 (five) minutes x 3 doses as needed for chest pain.    . simvastatin (ZOCOR) 80 MG tablet TAKE 1/2 TABLET (40 MG TOTAL) BY MOUTH AT BEDTIME.    Marland Kitchen spironolactone (ALDACTONE) 25 MG tablet Take 0.5 tablets (12.5 mg total) by mouth daily. 30 tablet 0   No current facility-administered medications for this visit.     Allergies:   Patient has no known allergies.   Social History:  The patient  reports that he quit smoking about 10 years ago. His smoking use included cigars. he has never used smokeless tobacco. He reports that he drinks alcohol. He reports that he does not use drugs.   Family History:  The patient's family history includes Diabetes in his mother and unknown relative; Heart Problems in his father; Heart failure in his unknown relative; Hypertension in his  mother.    ROS:  Please see the history of present illness.  Otherwise, review of systems is positive for snoring.  All other systems are reviewed and negative.    PHYSICAL EXAM: VS:  BP 138/84   Pulse 64   Ht 5\' 10"  (1.778 m)   Wt 239 lb 6.4 oz (108.6 kg)   SpO2 93%   BMI 34.35 kg/m  , BMI Body mass index is 34.35 kg/m. GEN: Well nourished, well developed, pleasant obese male in no acute distress  HEENT: normal  Neck: no JVD, no masses. No carotid bruits Cardiac: RRR without murmur or gallop     Respiratory:  clear to auscultation bilaterally, normal work of breathing GI: soft, nontender, nondistended, + BS MS: no deformity or atrophy  Ext: no pretibial edema, pedal pulses 2+=  bilaterally Skin: warm and dry, no rash Neuro:  Strength and sensation are intact Psych: euthymic mood, full affect  EKG:  EKG is ordered today. The ekg ordered today shows normal sinus rhythm 64 bpm, age-indeterminate anterior infarct, cannot rule out age-indeterminate inferior infarct.  Recent Labs: No results found for requested labs within last 8760 hours.   Lipid Panel     Component Value Date/Time   CHOL 121 05/21/2010 1139   TRIG 95.0 05/21/2010 1139   HDL 34.60 (L) 05/21/2010 1139   CHOLHDL 3 05/21/2010 1139   VLDL 19.0 05/21/2010 1139   LDLCALC 67 05/21/2010 1139      Wt Readings from Last 3 Encounters:  02/06/17 239 lb 6.4 oz (108.6 kg)  12/10/16 235 lb (106.6 kg)  11/08/15 236 lb (107 kg)     Cardiac Studies Reviewed: Labs are reviewed [place] [facility] dated September 27, 2016: Creatinine is 1.0, glucose 139, hemoglobin A1c 6.1, hemoglobin 12.9, platelet count 203,000, potassium 4.4, cholesterol 122, triglyceride 101, HDL 38, LDL 64  ASSESSMENT AND PLAN: 1.  Chronic systolic heart failure: New York Heart Association functional class II symptoms.  Stable on goal doses of carvedilol, lisinopril, and Spironolactone.  Most recent labs are reviewed.  His labs are followed every 6 months through the [facility].  2.  Coronary artery disease, native vessel, without angina: He continues on aspirin for antiplatelet therapy and simvastatin for lipid lowering.  The  3.  Hyperlipidemia: Treated with simvastatin 40 mg daily.  Lipids are at goal  4.  Type 2 diabetes: Hemoglobin A1c is reviewed.  He is currently diet controlled.  Discussed specific dietary recommendations and weight loss goals with him today.  Current medicines are reviewed with the patient today.  The patient does not have concerns regarding medicines.  Labs/ tests ordered today include:   Orders Placed This Encounter  Procedures  . EKG 12-Lead    Disposition:   FU one year  Signed, Tonny BollmanMichael Dacota Devall, MD   02/06/2017 5:54 PM    [facility] [address], [place], [place]  [address] Phone: [phone]; Fax: [phone]

## 2017-02-17 ENCOUNTER — Telehealth: Payer: Self-pay | Admitting: Cardiology

## 2017-02-17 ENCOUNTER — Encounter: Payer: Self-pay | Admitting: *Deleted

## 2017-02-17 NOTE — Telephone Encounter (Signed)
Spoke with pt and reminded pt of remote transmission that is due today. Pt verbalized understanding.   

## 2017-02-20 ENCOUNTER — Encounter: Payer: Self-pay | Admitting: Cardiology

## 2017-02-20 NOTE — Progress Notes (Signed)
Letter  

## 2017-04-23 ENCOUNTER — Telehealth: Payer: Self-pay | Admitting: Cardiology

## 2017-04-23 NOTE — Telephone Encounter (Signed)
LMOVM requesting that pt send manual transmission b/c home monitor has not updated in at least 7 days.    

## 2017-06-04 ENCOUNTER — Ambulatory Visit (INDEPENDENT_AMBULATORY_CARE_PROVIDER_SITE_OTHER): Payer: Self-pay | Admitting: *Deleted

## 2017-06-04 DIAGNOSIS — I255 Ischemic cardiomyopathy: Secondary | ICD-10-CM

## 2017-06-04 NOTE — Progress Notes (Signed)
Remote ICD transmission.   

## 2017-06-05 ENCOUNTER — Encounter: Payer: Self-pay | Admitting: Cardiology

## 2017-06-26 LAB — CUP PACEART REMOTE DEVICE CHECK
Implantable Lead Location: 753860
MDC IDC LEAD IMPLANT DT: 20120605
MDC IDC PG IMPLANT DT: 20120605
MDC IDC SESS DTM: 20190425110130
Pulse Gen Serial Number: 809738

## 2017-08-18 ENCOUNTER — Telehealth: Payer: Self-pay | Admitting: Cardiology

## 2017-08-18 NOTE — Telephone Encounter (Signed)
Spoke w/ pt and requested that he send a manual transmission b/c his home monitor has not updated in at least 7 days.   

## 2017-09-03 ENCOUNTER — Telehealth: Payer: Self-pay | Admitting: Cardiology

## 2017-09-03 ENCOUNTER — Encounter: Payer: Self-pay | Admitting: *Deleted

## 2017-09-03 NOTE — Telephone Encounter (Signed)
LMOVM reminding pt to send remote transmission.   

## 2017-09-05 ENCOUNTER — Encounter: Payer: Self-pay | Admitting: Cardiology

## 2017-09-19 ENCOUNTER — Telehealth: Payer: Self-pay

## 2017-09-19 NOTE — Telephone Encounter (Signed)
Spoke with pt and reminded pt of remote transmission that is due today. Pt verbalized understanding.   

## 2017-09-26 ENCOUNTER — Telehealth: Payer: Self-pay | Admitting: Cardiology

## 2017-09-26 NOTE — Telephone Encounter (Signed)
Spoke w/ pt and requested that he send a manual transmission b/c his home monitor has not updated in at least 7 days.   

## 2017-10-16 ENCOUNTER — Encounter: Payer: Self-pay | Admitting: Cardiology

## 2017-10-16 ENCOUNTER — Telehealth: Payer: Self-pay | Admitting: Cardiology

## 2017-10-16 NOTE — Telephone Encounter (Signed)
LMOVM requesting that pt send manual transmission b/c home monitor has not updated in at least 7 days.    

## 2017-11-10 ENCOUNTER — Ambulatory Visit (INDEPENDENT_AMBULATORY_CARE_PROVIDER_SITE_OTHER): Payer: Self-pay | Admitting: *Deleted

## 2017-11-10 DIAGNOSIS — I255 Ischemic cardiomyopathy: Secondary | ICD-10-CM

## 2017-11-11 ENCOUNTER — Encounter: Payer: Self-pay | Admitting: Cardiology

## 2017-11-11 NOTE — Progress Notes (Signed)
Remote ICD transmission.   

## 2017-12-02 LAB — CUP PACEART REMOTE DEVICE CHECK
Date Time Interrogation Session: 20191001123620
Implantable Lead Implant Date: 20120605
Implantable Pulse Generator Implant Date: 20120605
MDC IDC LEAD LOCATION: 753860
Pulse Gen Serial Number: 809738

## 2018-01-13 ENCOUNTER — Telehealth: Payer: Self-pay | Admitting: Cardiology

## 2018-01-13 NOTE — Telephone Encounter (Signed)
Spoke w/ pt wife and requested that he send a manual transmission b/c his home monitor has not updated in at least 7 days.   

## 2018-01-14 ENCOUNTER — Ambulatory Visit (INDEPENDENT_AMBULATORY_CARE_PROVIDER_SITE_OTHER): Payer: Self-pay | Admitting: Internal Medicine

## 2018-01-14 ENCOUNTER — Encounter: Payer: Self-pay | Admitting: Internal Medicine

## 2018-01-14 VITALS — BP 116/74 | HR 58 | Ht 70.0 in | Wt 217.4 lb

## 2018-01-14 DIAGNOSIS — I5022 Chronic systolic (congestive) heart failure: Secondary | ICD-10-CM

## 2018-01-14 DIAGNOSIS — I255 Ischemic cardiomyopathy: Secondary | ICD-10-CM

## 2018-01-14 DIAGNOSIS — Z95810 Presence of automatic (implantable) cardiac defibrillator: Secondary | ICD-10-CM

## 2018-01-14 DIAGNOSIS — I10 Essential (primary) hypertension: Secondary | ICD-10-CM

## 2018-01-14 LAB — CUP PACEART INCLINIC DEVICE CHECK
Battery Remaining Longevity: 39 mo
Date Time Interrogation Session: 20191113175358
HighPow Impedance: 77.625
Implantable Lead Implant Date: 20120605
Implantable Pulse Generator Implant Date: 20120605
Lead Channel Pacing Threshold Amplitude: 1 V
Lead Channel Setting Pacing Amplitude: 2.5 V
Lead Channel Setting Pacing Pulse Width: 0.5 ms
MDC IDC LEAD LOCATION: 753860
MDC IDC MSMT LEADCHNL RV IMPEDANCE VALUE: 400 Ohm
MDC IDC MSMT LEADCHNL RV PACING THRESHOLD PULSEWIDTH: 0.5 ms
MDC IDC MSMT LEADCHNL RV SENSING INTR AMPL: 12 mV
MDC IDC PG SERIAL: 809738
MDC IDC SET LEADCHNL RV SENSING SENSITIVITY: 0.5 mV
MDC IDC STAT BRADY RV PERCENT PACED: 0.01 %

## 2018-01-14 NOTE — Patient Instructions (Addendum)
Medication Instructions:  Your physician recommends that you continue on your current medications as directed. Please refer to the Current Medication list given to you today.  Labwork: None ordered.  Testing/Procedures: None ordered.  Follow-Up: Your physician wants you to follow-up in: one year with Gypsy Balsam, NP.   You will receive a reminder letter in the mail two months in advance. If you don't receive a letter, please call our office to schedule the follow-up appointment.  Remote monitoring is used to monitor your ICD from home. This monitoring reduces the number of office visits required to check your device to one time per year. It allows [place] to keep an eye on the functioning of your device to ensure it is working properly. You are scheduled for a device check from home on 02/09/2018. You may send your transmission at any time that day. If you have a wireless device, the transmission will be sent automatically. After your physician reviews your transmission, you will receive a postcard with your next transmission date.  Any Other Special Instructions Will Be Listed Below (If Applicable).  If you need a refill on your cardiac medications before your next appointment, please call your pharmacy.

## 2018-01-14 NOTE — Progress Notes (Signed)
PCP: Frederich Chick., MD Primary Cardiologist: Dr Excell Seltzer Primary EP: Dr Johney Frame  [name] [name] is a 57 y.o. male who presents today for routine electrophysiology followup.  Since last being seen in our clinic, the patient reports doing very well.  Today, he denies symptoms of palpitations, chest pain, shortness of breath,  lower extremity edema, dizziness, presyncope, syncope, or ICD shocks.  The patient is otherwise without complaint today.   Past Medical History:  Diagnosis Date  . CAD (coronary artery disease)    anterior MI 2008, treated with bare metal stent to LAD  . GERD (gastroesophageal reflux disease)   . HLD (hyperlipidemia)   . HTN (hypertension)   . Ischemic cardiomyopathy    NYHA Class II/III CHF  . MI (myocardial infarction) (HCC)   . 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 Umbilical hernia    Past Surgical History:  Procedure Laterality Date  . CARDIAC DEFIBRILLATOR PLACEMENT  08/07/10   SJM Fortify VR ST implanted by Dr Johney Frame, part of the Analyze ST ICD study  . FINGER SURGERY  2/09   laceration repair    ROS- all systems are reviewed and negative except as per HPI above  Current Outpatient Medications  Medication Sig Dispense Refill  . aspirin 81 MG tablet Take 81 mg by mouth once.     . carvedilol (COREG) 25 MG tablet Take 1 tablet (25 mg total) by mouth 2 (two) times daily with a meal. 60 tablet 0  . cholecalciferol (VITAMIN D) 1000 UNITS tablet Take 1,000 Units by mouth daily.    . fish oil-omega-3 fatty acids 1000 MG capsule Take 1 g by mouth daily.     Marland Kitchen lisinopril (PRINIVIL,ZESTRIL) 10 MG tablet Take 1 tablet (10 mg total) by mouth daily. 30 tablet 0  . Multiple Vitamin (MULTIVITAMIN) tablet Take 1 tablet by mouth daily.      . nitroGLYCERIN (NITROSTAT) 0.4 MG SL tablet Place 0.4 mg under the tongue every 5 (five) minutes x 3 doses as needed for chest pain.    . simvastatin (ZOCOR) 80 MG tablet TAKE 1/2 TABLET (40 MG TOTAL) BY MOUTH AT  BEDTIME.    Marland Kitchen spironolactone (ALDACTONE) 25 MG tablet Take 0.5 tablets (12.5 mg total) by mouth daily. 30 tablet 0   No current facility-administered medications for this visit.     Physical Exam: Vitals:   01/14/18 1558  BP: 116/74  Pulse: (!) 58  SpO2: 97%  Weight: 217 lb 6.4 oz (98.6 kg)  Height: 5\' 10"  (1.778 m)    GEN- The patient is well appearing, alert and oriented x 3 today.   Head- normocephalic, atraumatic Eyes-  Sclera clear, conjunctiva pink Ears- hearing intact Oropharynx- clear Lungs- Clear to ausculation bilaterally, normal work of breathing Chest- ICD pocket is well healed Heart- Regular rate and rhythm, no murmurs, rubs or gallops, PMI not laterally displaced GI- soft, NT, ND, + BS Extremities- no clubbing, cyanosis, or edema  ICD interrogation- reviewed in detail today,  See PACEART report  ekg tracing ordered today is personally reviewed and shows sinus rhythm 58 bpm, PR 168 msec, QRS 114 msec, anterolateral infarct  Wt Readings from Last 3 Encounters:  01/14/18 217 lb 6.4 oz (98.6 kg)  02/06/17 239 lb 6.4 oz (108.6 kg)  12/10/16 235 lb (106.6 kg)    Assessment and Plan:  1.  Chronic systolic dysfunction/ ischemic CM/CAD euvolemic today Stable on an appropriate medical regimen Normal ICD function See Pace Art  report No changes today ST device cannot be followed in ICM clinic We discussed battery integrity and the importance of close remote follow-up today.  2. HTN Stable No change required today  Merlin Return in a year to see EP NP  Hillis Range MD, [facility] 01/14/2018 4:30 PM

## 2018-01-27 ENCOUNTER — Telehealth: Payer: Self-pay | Admitting: Cardiology

## 2018-01-27 NOTE — Telephone Encounter (Signed)
LMOVM requesting that pt send manual transmission b/c home monitor has not updated in at least 7 days.    

## 2018-02-09 ENCOUNTER — Ambulatory Visit (INDEPENDENT_AMBULATORY_CARE_PROVIDER_SITE_OTHER): Payer: Self-pay

## 2018-02-09 DIAGNOSIS — I255 Ischemic cardiomyopathy: Secondary | ICD-10-CM

## 2018-02-13 ENCOUNTER — Ambulatory Visit: Payer: Self-pay | Admitting: Physician Assistant

## 2018-02-13 NOTE — Progress Notes (Signed)
Remote ICD transmission.   

## 2018-03-17 ENCOUNTER — Ambulatory Visit (INDEPENDENT_AMBULATORY_CARE_PROVIDER_SITE_OTHER): Payer: Self-pay | Admitting: Physician Assistant

## 2018-03-17 ENCOUNTER — Encounter: Payer: Self-pay | Admitting: Physician Assistant

## 2018-03-17 VITALS — BP 110/78 | HR 70 | Ht 70.0 in | Wt 208.8 lb

## 2018-03-17 DIAGNOSIS — I5022 Chronic systolic (congestive) heart failure: Secondary | ICD-10-CM

## 2018-03-17 DIAGNOSIS — I10 Essential (primary) hypertension: Secondary | ICD-10-CM

## 2018-03-17 DIAGNOSIS — I2510 Atherosclerotic heart disease of native coronary artery without angina pectoris: Secondary | ICD-10-CM

## 2018-03-17 NOTE — Patient Instructions (Signed)
Medication Instructions:  Your physician recommends that you continue on your current medications as directed. Please refer to the Current Medication list given to you today.   If you need a refill on your cardiac medications before your next appointment, please call your pharmacy.   Lab work: NONE  If you have labs (blood work) drawn today and your tests are completely normal, you will receive your results only by: . MyChart Message (if you have MyChart) OR . A paper copy in the mail If you have any lab test that is abnormal or we need to change your treatment, we will call you to review the results.  Testing/Procedures: NONE  Follow-Up: At [facility], you and your health needs are our priority.  As part of our continuing mission to provide you with exceptional heart care, we have created designated Provider Care Teams.  These Care Teams include your primary Cardiologist (physician) and Advanced Practice Providers (APPs -  Physician Assistants and Nurse Practitioners) who all work together to provide you with the care you need, when you need it. . You will need a follow up appointment in:  12 months.  Please call our office 2 months in advance to schedule this appointment.  You may see Michael [name], MD or one of the following Advanced Practice Providers on your designated Care Team: . Scott Weaver, PA-C . Vin Bhagat, PA-C . Janine Hammond, NP  Any Other Special Instructions Will Be Listed Below (If Applicable).    

## 2018-03-17 NOTE — Progress Notes (Signed)
Cardiology Office Note:    Date:  03/17/2018   ID:  [name] [name], DOB [birth year], MRN [id number]  PCP:  Frederich Chick., MD  Cardiologist:  Tonny Bollman, MD   Electrophysiologist:  Hillis Range, MD   Referring MD: Frederich Chick., MD   Chief Complaint  Patient presents with  . Follow-up    CAD, CHF     History of Present Illness:    [name] [name] is a 58 y.o. male with CAD, status post anterior MI in 2008 treated with a BMS to the LAD, ischemic cardiomyopathy with an EF of 25-30%, systolic CHF, status post AICD.  He was last seen by Dr. Excell Seltzer in Dec 2018.      Mr. Kobashigawa returns for follow up.  He is here alone.  Since last seen he has done well.  He has lost about 30 lbs since his last visit.  He has not had chest pain, shortness of breath, syncope, paroxysmal nocturnal dyspnea, edema or ICD shocks.    Prior CV studies:   The following studies were reviewed today:   - LHC (03/2006):  prox to mid LAD occluded => 2.75 x 28 mm Vision BMS   - Echo (06/2010):  Inferoapical, apical, septal and dist ant AK, inferobasal HK, mild LVH, EF 25-30%, mild MR, mild LAE.  - Cardiac MRI (08/2008):  Ant, inf, septal, apical AK with full thickness scar, EF 23%  Past Medical History:  Diagnosis Date  . CAD (coronary artery disease)    anterior MI 2008, treated with bare metal stent to LAD  . Chronic systolic heart failure (HCC) 06/27/2010   Ischemic CM s/p ICD // Large anterior MI in 2008 tx with BMS to LAD // cMRI 6/10:  Ant, inf, septal, apical AK with full thickness scar, EF 23% // Echo 4/12: Inferoapical, apical, septal and dist ant AK, inferobasal HK, mild LVH, EF 25-30%, mild MR, mild LAE   . GERD (gastroesophageal reflux disease)   . HLD (hyperlipidemia)   . HTN (hypertension)   . Ischemic cardiomyopathy    NYHA Class II/III CHF  . MI (myocardial infarction) (HCC)   . 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 Umbilical hernia    Surgical Hx: The patient   has a past surgical history that includes Finger surgery (2/09) and Cardiac defibrillator placement (08/07/10).   Current Medications: Current Meds  Medication Sig  . aspirin 81 MG tablet Take 81 mg by mouth once.   . carvedilol (COREG) 25 MG tablet Take 1 tablet (25 mg total) by mouth 2 (two) times daily with a meal.  . cholecalciferol (VITAMIN D) 1000 UNITS tablet Take 1,000 Units by mouth daily.  . fish oil-omega-3 fatty acids 1000 MG capsule Take 1 g by mouth daily.   Marland Kitchen lisinopril (PRINIVIL,ZESTRIL) 10 MG tablet Take 1 tablet (10 mg total) by mouth daily.  . Multiple Vitamin (MULTIVITAMIN) tablet Take 1 tablet by mouth daily.    . nitroGLYCERIN (NITROSTAT) 0.4 MG SL tablet Place 0.4 mg under the tongue every 5 (five) minutes x 3 doses as needed for chest pain.  . simvastatin (ZOCOR) 80 MG tablet TAKE 1/2 TABLET (40 MG TOTAL) BY MOUTH AT BEDTIME.  Marland Kitchen spironolactone (ALDACTONE) 25 MG tablet Take 0.5 tablets (12.5 mg total) by mouth daily.     Allergies:   Patient has no known allergies.   Social History   Tobacco Use  . Smoking status: Former Smoker    Types:  Cigars    Last attempt to quit: 03/04/2006    Years since quitting: 12.0  . Smokeless tobacco: Never Used  Substance Use Topics  . Alcohol use: Yes    Comment: occasionally  . Drug use: No     Family Hx: The patient's family history includes Diabetes in his mother and unknown relative; Heart Problems in his father; Heart failure in his unknown relative; Hypertension in his mother.  ROS:   Please see the history of present illness.    ROS All other systems reviewed and are negative.   EKGs/Labs/Other Test Reviewed:    EKG:  EKG is not ordered today.    Recent Labs: No results found for requested labs within last 8760 hours.   Recent Lipid Panel Lab Results  Component Value Date/Time   CHOL 121 05/21/2010 11:39 AM   TRIG 95.0 05/21/2010 11:39 AM   HDL 34.60 (L) 05/21/2010 11:39 AM   CHOLHDL 3 05/21/2010 11:39 AM     LDLCALC 67 05/21/2010 11:39 AM    Physical Exam:    VS:  BP 110/78   Pulse 70   Ht 5\' 10"  (1.778 m)   Wt 208 lb 12.8 oz (94.7 kg)   SpO2 94%   BMI 29.96 kg/m     Wt Readings from Last 3 Encounters:  03/17/18 208 lb 12.8 oz (94.7 kg)  01/14/18 217 lb 6.4 oz (98.6 kg)  02/06/17 239 lb 6.4 oz (108.6 kg)     Physical Exam  Constitutional: He is oriented to person, place, and time. He appears well-developed and well-nourished. No distress.  HENT:  Head: Normocephalic and atraumatic.  Neck: Neck supple. No JVD present. Carotid bruit is not present.  Cardiovascular: Normal rate, regular rhythm, S1 normal and S2 normal.  No murmur heard. Pulmonary/Chest: Breath sounds normal. He has no rales.  Abdominal: Soft. There is no hepatomegaly.  Musculoskeletal:        General: No edema.  Neurological: He is alert and oriented to person, place, and time.  Skin: Skin is warm and dry.    ASSESSMENT & PLAN:    Chronic systolic heart failure (HCC) EF 25-30 by echo in 2012.  He is NYHA 2.  He is doing well on his current Rx which includes Carvedilol, Lisinopril, Spironolactone.  Continue current therapy.  Coronary artery disease involving native coronary artery of native heart without angina pectoris Hx of anterior MI treated with BMS to the LAD. He is doing well without angina.  He will continue on ASA, beta-blocker, statin, ACE inhibitor.  His labs are checked at the [place] and he will send those to [place] when they are done next month.   Essential hypertension The patient's blood pressure is controlled on his current regimen.  Continue current therapy.     Dispo:  Return in about 1 year (around 03/18/2019) for Routine Follow Up, w/ Dr. Excell Seltzer.   Medication Adjustments/Labs and Tests Ordered: Current medicines are reviewed at length with the patient today.  Concerns regarding medicines are outlined above.  Tests Ordered: No orders of the defined types were placed in this  encounter.  Medication Changes: No orders of the defined types were placed in this encounter.   Signed, Tereso Newcomer, PA-C  03/17/2018 12:23 PM    [facility] [address] [place], [place], [place]  [address] Phone: [phone]; Fax: [phone]

## 2018-03-28 LAB — CUP PACEART REMOTE DEVICE CHECK
Implantable Lead Implant Date: 20120605
Implantable Pulse Generator Implant Date: 20120605
MDC IDC LEAD LOCATION: 753860
MDC IDC PG SERIAL: 809738
MDC IDC SESS DTM: 20200125085437

## 2018-05-11 ENCOUNTER — Ambulatory Visit (INDEPENDENT_AMBULATORY_CARE_PROVIDER_SITE_OTHER): Payer: Self-pay | Admitting: *Deleted

## 2018-05-11 DIAGNOSIS — I255 Ischemic cardiomyopathy: Secondary | ICD-10-CM

## 2018-05-12 ENCOUNTER — Telehealth: Payer: Self-pay

## 2018-05-12 NOTE — Telephone Encounter (Signed)
Spoke with patient to remind of missed remote transmission 

## 2018-05-14 LAB — CUP PACEART REMOTE DEVICE CHECK
Date Time Interrogation Session: 20200312110214
Implantable Pulse Generator Implant Date: 20120605
MDC IDC LEAD IMPLANT DT: 20120605
MDC IDC LEAD LOCATION: 753860
Pulse Gen Serial Number: 809738

## 2018-05-20 NOTE — Progress Notes (Signed)
Remote ICD transmission.   

## 2018-07-29 DIAGNOSIS — I472 Ventricular tachycardia, unspecified: Secondary | ICD-10-CM

## 2018-07-29 HISTORY — DX: Ventricular tachycardia, unspecified: I47.20

## 2018-07-29 HISTORY — DX: Ventricular tachycardia: I47.2

## 2018-08-03 ENCOUNTER — Telehealth: Payer: Self-pay

## 2018-08-03 NOTE — Telephone Encounter (Signed)
Spoke with patient. He reports he feels fine right now, no symptoms since episode on 5/17. Explained without transmission we are unable to review any arrhythmia episodes. Pt reports that he spoke with [employer] and they reset his monitor. They will call him tomorrow around 1:30pm to assist with repairing it. He agrees to call the DC directly tomorrow after further monitor troubleshooting. ED precautions given for worsening cardiac symptoms. Pt verbalizes understanding and thanked me for my call back.

## 2018-08-03 NOTE — Telephone Encounter (Signed)
The pt states he thinks he been shocked by his ICD. The pt states he did fall and passed out for a few seconds. The incident occur last Wednesday 07/19/2018. Pt states he was in pain and sore in the chest and shoulder. Pt did not go to the emergency room. I tried to get the pt to send a transmission with his home monitor. We tried to trouble shoot the monitor but was unsuccessful. I gave the pt the number to [facility] tech support to get additional help. I let the pt know as soon as we get the transmission that the nurse will give him a call back. The best number to reach the pt is [phone].

## 2018-08-03 NOTE — Telephone Encounter (Signed)
LMOVM requesting call back to DC. Transmission not received, monitor last updated with network 07/20/18, last alerts check on 05/12/18.

## 2018-08-05 NOTE — Telephone Encounter (Signed)
Transmission received. VF episode on 07/29/18 terminated with ATP x1 during charge, shock aborted.  Spoke with patient. Advised of episode and [place] DMV driving restrictions x6 months. Pt correlates brief syncopal episode, 1-2 sec duration on that day. Has some residual upper body muscle soreness and some swelling at his shoulder and neck as a result of the fall, but reports it is improving each day. Pt denies cardiac or neuro symptoms. Compliant with cardiac meds, including carvedilol.   Discussed shock plan and ED precautions if any future episodes or worsening cardiac symptoms. Encouraged PCP f/u if upper body soreness doesn't continue to improve. Pt verbalizes understanding of all instructions and thanked me for my call.  Will route to MD for review.

## 2018-08-10 ENCOUNTER — Telehealth: Payer: Self-pay

## 2018-08-10 ENCOUNTER — Ambulatory Visit: Payer: Self-pay | Admitting: General Surgery

## 2018-08-10 ENCOUNTER — Encounter: Payer: Self-pay | Admitting: *Deleted

## 2018-08-10 NOTE — Telephone Encounter (Signed)
   [facility] Pre-operative Risk Assessment    Request for surgical clearance:  1. What type of surgery is being performed? Umbilical Hernia without obstruction and without gangrene   2. When is this surgery scheduled? TBD   3. What type of clearance is required (medical clearance vs. Pharmacy clearance to hold med vs. Both)? Medical  4. Are there any medications that need to be held prior to surgery and how long? No   5. Practice name and name of physician performing surgery? Dr. Anne Hahn Ramirez/[facility] [place] [facility]   6. What is your office phone number [phone]    7.   What is your office fax number [phone]  8.   Anesthesia type (None, local, MAC, general) ? Not listed   [name] [name] 08/10/2018, 11:12 AM  _________________________________________________________________   (provider comments below)

## 2018-08-10 NOTE — Telephone Encounter (Signed)
Received another fax from [facility], advising on to hold Aspirin for laparoscopic umbilical hernia repair.

## 2018-08-10 NOTE — Telephone Encounter (Signed)
Given ventricular arrhythmia, I think that she should be seen by EP prior to clearance.

## 2018-08-10 NOTE — H&P (Signed)
  History of Present Illness Ralene Ok MD; 08/10/2018 9:29 AM) The patient is a 58 year old male who presents with an umbilical hernia. Referred by: Cleophas Dunker, NP Chief Complaint: Umbilical hernia  Patient is a 57 year old male with history of hypertension, hyperlipidemia, defibrillator placement, who comes in with a several year history of umbilical hernia. Patient states that recently over the last 1-2 months he's had increased pain and discomfort of the hernia with activity. Patient is fairly active and does yardwork and work around the house. Patient states he has had several recurrences were the hernia has become firm and painful. This did reduce on its own. Patient's had no other signs or symptoms of strangulation.  He has no History abdominal surgery. Patient sees Dr. Burt Knack his cardiologist. Patient does have defibrillator.    Allergies Emeline Gins, [place]; 08/10/2018 9:20 AM) No Known Drug Allergies [08/10/2018]: Allergies Reconciled   Medication History Emeline Gins, CMA; 08/10/2018 9:22 AM) Aspirin (325MG  Tablet, Oral) Active. Carvedilol (25MG  Tablet, Oral) Active. Lisinopril (10MG  Tablet, Oral) Active. Spironolactone (25MG  Tablet, Oral) Active. Simvastatin (20MG  Tablet, Oral) Active. Omeprazole (10MG  Capsule ER, Oral) Active. Medications Reconciled    Review of Systems Ralene Ok, MD; 08/10/2018 9:31 AM) All other systems negative  Vitals Emeline Gins CMA; 08/10/2018 9:20 AM) 08/10/2018 9:20 AM Weight: 206 lb Height: 70in Body Surface Area: 2.11 m Body Mass Index: 29.56 kg/m  Temp.: 98.73F  Pulse: 74 (Regular)  BP: 144/88 (Sitting, Left Arm, Standard)       Physical Exam Ralene Ok MD; 08/10/2018 9:30 AM) The physical exam findings are as follows: Note:Constitutional: No acute distress, conversant, appears stated age  Eyes: Anicteric sclerae, moist conjunctiva, no lid lag  Neck: No thyromegaly, trachea  midline, no cervical lymphadenopathy  Lungs: Clear to auscultation biilaterally, normal respiratory effot  Cardiovascular: regular rate & rhythm, no murmurs, no peripheal edema, pedal pulses 2+  GI: Soft, no masses or hepatosplenomegaly, non-tender to palpation, small umbilical hernia, approximately 1 cm, soft, reducible  MSK: Normal gait, no clubbing cyanosis, edema  Skin: No rashes, palpation reveals normal skin turgor  Psychiatric: Appropriate judgment and insight, oriented to person, place, and time  Abdomen Inspection    Assessment & Plan Ralene Ok MD; 09/07/8240 3:53 AM) UMBILICAL HERNIA WITHOUT OBSTRUCTION AND WITHOUT GANGRENE (K42.9) Impression: 58 year old male with a history of hypertension, hyperlipidemia, previous MI, with current defibrillator in place.  1. The patient will like to proceed to the operating room for laparoscopic umbilical hernia repair with mesh.  2. I discussed with the patient the signs and symptoms of incarceration and strangulation and the need to proceed to the ER should they occur.  3. I discussed with the patient the risks and benefits of the procedure to include but not limited to: Infection, bleeding, damage to surrounding structures, possible need for further surgery, possible nerve pain, and possible recurrence. The patient was understanding and wishes to proceed.

## 2018-08-10 NOTE — Telephone Encounter (Signed)
LVM to callback regarding surgical clearance.  

## 2018-08-10 NOTE — Telephone Encounter (Signed)
Ok to hold ASA as requested. EP evaluation recommended by Dr Rayann Heman. thanks

## 2018-08-10 NOTE — Telephone Encounter (Signed)
  [name] [name] is a 58 y.o. male with CAD, status post anterior MI in 2008 treated with a BMS to the LAD, ischemic cardiomyopathy with an EF of 59-16%, systolic CHF, status post AICD.    He denies any recent CP, dyspnea or LEE, but a device transmission was recently received showing VF episode on 07/29/18 that terminated with ATP x1 during charge, shock aborted. Pt correlated brief syncopal episode, 1-2 sec duration on that day. I will route to Dr. Rayann Heman and Dr. Burt Knack to weigh in regarding surgical clearance and ask Dr. Burt Knack to weigh in regarding ASA hold.

## 2018-08-11 ENCOUNTER — Telehealth: Payer: Self-pay

## 2018-08-11 NOTE — Telephone Encounter (Signed)
   Primary [place], MD  Chart reviewed as part of pre-operative protocol coverage. Because of [name] [name]'s past medical history and time since last visit, he/she will require a follow-up visit in order to better assess preoperative cardiovascular risk. Due to recent ventricular arrhythmia noted on ICD, Dr. Rayann Heman would like for the pt to be seen by EP prior to clearance. I called and informed the patient so that he is aware.   Pre-op covering staff: - Please schedule appointment with EP and call patient to inform them. - Please contact requesting surgeon's office via preferred method (i.e, phone, fax) to inform them of need for appointment prior to surgery.  If applicable, this message will also be routed to pharmacy pool and/or primary cardiologist for input on holding anticoagulant/antiplatelet agent as requested below so that this information is available at time of patient's appointment.   Daune Perch, NP  08/11/2018, 10:55 AM

## 2018-08-11 NOTE — Telephone Encounter (Signed)
Left message for patient to remind of missed remote transmission.  

## 2018-08-11 NOTE — Telephone Encounter (Signed)
Per Sonia Baller, Dr. Jackalyn Lombard nurse surgical clearances can be done by virtual visit, staff message sent to [facility] to schedule appointment with Dr. Rayann Heman.

## 2018-08-17 ENCOUNTER — Encounter: Payer: Self-pay | Admitting: Internal Medicine

## 2018-08-17 ENCOUNTER — Other Ambulatory Visit: Payer: Self-pay

## 2018-08-17 ENCOUNTER — Telehealth (INDEPENDENT_AMBULATORY_CARE_PROVIDER_SITE_OTHER): Payer: Self-pay | Admitting: Internal Medicine

## 2018-08-17 ENCOUNTER — Telehealth: Payer: Self-pay

## 2018-08-17 VITALS — Ht 70.0 in | Wt 200.0 lb

## 2018-08-17 DIAGNOSIS — I472 Ventricular tachycardia, unspecified: Secondary | ICD-10-CM

## 2018-08-17 DIAGNOSIS — I255 Ischemic cardiomyopathy: Secondary | ICD-10-CM

## 2018-08-17 DIAGNOSIS — I10 Essential (primary) hypertension: Secondary | ICD-10-CM

## 2018-08-17 DIAGNOSIS — I5022 Chronic systolic (congestive) heart failure: Secondary | ICD-10-CM

## 2018-08-17 NOTE — Telephone Encounter (Signed)
Left message regarding appt on 08/17/18. 

## 2018-08-17 NOTE — Progress Notes (Signed)
Electrophysiology TeleHealth Note   Due to national recommendations of social distancing due to COVID 19, an audio telehealth visit is felt to be most appropriate for this patient at this time.  See MyChart message from today for the patient's consent to telehealth for [facility].  We were not able to make a mychart visit work and a phone call was performed.   Date:  08/17/2018   ID:  [name] [name], DOB Nov 21, 1960, MRN [id number]  Location: patient's home  Provider location:  [place]  Evaluation Performed: Follow-up visit  PCP:  Frederich Chick., MD   Electrophysiologist:  Dr Johney Frame  Chief Complaint:  CHF  History of Present Illness:    [name] [name] is a 58 y.o. male who presents via telehealth conferencing today.  Since last being seen in our clinic, the patient reports doing reasonably well.  He did have syncope 07/29/2018 for which he had VT at 218 bpm and received appropriate ATP therapy.  He collapsed and hit the floor but quickly woke.  He has had neck pain since then.  He has had neck x rays and workup through the [place].   Today, he denies symptoms of palpitations, chest pain, shortness of breath,  lower extremity edema, dizziness, presyncope, or further syncope.   No further arrhythmias.  The patient is otherwise without complaint today.  The patient denies symptoms of fevers, chills, cough, or new SOB worrisome for COVID 19.  Past Medical History:  Diagnosis Date  . CAD (coronary artery disease)    anterior MI 2008, treated with bare metal stent to LAD  . Chronic systolic heart failure (HCC) 06/27/2010   Ischemic CM s/p ICD // Large anterior MI in 2008 tx with BMS to LAD // cMRI 6/10:  Ant, inf, septal, apical AK with full thickness scar, EF 23% // Echo 4/12: Inferoapical, apical, septal and dist ant AK, inferobasal HK, mild LVH, EF 25-30%, mild MR, mild LAE   . GERD (gastroesophageal reflux disease)   . HLD (hyperlipidemia)   . HTN (hypertension)    . Ischemic cardiomyopathy    NYHA Class II/III CHF  . MI (myocardial infarction) (HCC)   . 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 Umbilical hernia   . Ventricular tachycardia (HCC) 07/29/2018   received appropriate ATP therapy for VT at 218 bp with syncope    Past Surgical History:  Procedure Laterality Date  . CARDIAC DEFIBRILLATOR PLACEMENT  08/07/10   SJM Fortify VR ST implanted by Dr Johney Frame, part of the Analyze ST ICD study  . FINGER SURGERY  2/09   laceration repair    Current Outpatient Medications  Medication Sig Dispense Refill  . aspirin 81 MG tablet Take 81 mg by mouth once.     . carvedilol (COREG) 25 MG tablet Take 1 tablet (25 mg total) by mouth 2 (two) times daily with a meal. 60 tablet 0  . cholecalciferol (VITAMIN D) 1000 UNITS tablet Take 1,000 Units by mouth daily.    . fish oil-omega-3 fatty acids 1000 MG capsule Take 1 g by mouth daily.     Marland Kitchen lisinopril (PRINIVIL,ZESTRIL) 10 MG tablet Take 1 tablet (10 mg total) by mouth daily. 30 tablet 0  . Multiple Vitamin (MULTIVITAMIN) tablet Take 1 tablet by mouth daily.      . nitroGLYCERIN (NITROSTAT) 0.4 MG SL tablet Place 0.4 mg under the tongue every 5 (five) minutes x 3 doses as needed for chest pain.    Marland Kitchen  simvastatin (ZOCOR) 80 MG tablet TAKE 1/2 TABLET (40 MG TOTAL) BY MOUTH AT BEDTIME.    Marland Kitchen spironolactone (ALDACTONE) 25 MG tablet Take 0.5 tablets (12.5 mg total) by mouth daily. 30 tablet 0   No current facility-administered medications for this visit.     Allergies:   Patient has no known allergies.   Social History:  The patient  reports that he quit smoking about 12 years ago. His smoking use included cigars. He has never used smokeless tobacco. He reports current alcohol use. He reports that he does not use drugs.   Family History:  The patient's  family history includes Diabetes in his mother and unknown relative; Heart Problems in his father; Heart failure in his unknown relative; Hypertension in his  mother.   ROS:  Please see the history of present illness.   All other systems are personally reviewed and negative.    Exam:    Vital Signs:  Ht 5\' 10"  (1.778 m)   Wt 200 lb (90.7 kg)   BMI 28.70 kg/m   Well sounding today   Labs/Other Tests and Data Reviewed:    Recent Labs: No results found for requested labs within last 8760 hours.   Wt Readings from Last 3 Encounters:  08/17/18 200 lb (90.7 kg)  03/17/18 208 lb 12.8 oz (94.7 kg)  01/14/18 217 lb 6.4 oz (98.6 kg)     Last device remote is reviewed from [employer] which reveals normal device function,   ATP therapy delivered and reviewed on alert from 07/29/2018    ASSESSMENT & PLAN:    1.  VT Recent appropriate therapy for VT No driving x 6 months (pt aware) Denies ischemic symptoms CHF stable Compliant with medicines Could consider addition of sotalol if further episodes occur vs uptitration of coreg  2. Ischemic CM/ chronic systolic dysfunction Doing well No changes  3. HTN Stable No change required today  4. Hernia repair Ok to proceed with medically necessary.  He states that he would prefer to wait 6 months and then pursue surgery if still doing well at that time.  He would prefer to wait until after COVID 19 anyway.   Follow-up:  6 months with me in the office Continue remotes in the interim   Patient Risk:  after full review of this patients clinical status, I feel that they are at moderate risk at this time.  Today, I have spent 15 minutes with the patient with telehealth technology discussing arrhythmia management .    [name], MD  08/17/2018 12:09 PM     [place] [address] [phone] (office) [phone] (fax)

## 2018-11-18 ENCOUNTER — Ambulatory Visit (INDEPENDENT_AMBULATORY_CARE_PROVIDER_SITE_OTHER): Payer: Self-pay | Admitting: *Deleted

## 2018-11-18 DIAGNOSIS — I255 Ischemic cardiomyopathy: Secondary | ICD-10-CM

## 2018-11-18 LAB — CUP PACEART REMOTE DEVICE CHECK
Date Time Interrogation Session: 20200916123534
Implantable Lead Implant Date: 20120605
Implantable Lead Location: 753860
Implantable Pulse Generator Implant Date: 20120605
Pulse Gen Serial Number: 809738

## 2018-11-24 ENCOUNTER — Encounter: Payer: Self-pay | Admitting: Cardiology

## 2018-11-24 NOTE — Progress Notes (Signed)
Remote ICD transmission.   

## 2019-02-08 ENCOUNTER — Other Ambulatory Visit: Payer: Self-pay

## 2019-02-08 ENCOUNTER — Telehealth (INDEPENDENT_AMBULATORY_CARE_PROVIDER_SITE_OTHER): Payer: Self-pay | Admitting: Internal Medicine

## 2019-02-08 DIAGNOSIS — I472 Ventricular tachycardia, unspecified: Secondary | ICD-10-CM

## 2019-02-08 DIAGNOSIS — I255 Ischemic cardiomyopathy: Secondary | ICD-10-CM

## 2019-02-08 DIAGNOSIS — I110 Hypertensive heart disease with heart failure: Secondary | ICD-10-CM

## 2019-02-08 DIAGNOSIS — I10 Essential (primary) hypertension: Secondary | ICD-10-CM

## 2019-02-08 DIAGNOSIS — Z95810 Presence of automatic (implantable) cardiac defibrillator: Secondary | ICD-10-CM

## 2019-02-08 DIAGNOSIS — I5022 Chronic systolic (congestive) heart failure: Secondary | ICD-10-CM

## 2019-02-08 NOTE — Progress Notes (Signed)
Electrophysiology TeleHealth Note   Due to national recommendations of social distancing due to COVID 19, an audio telehealth visit is felt to be most appropriate for this patient at this time.  Verbal consent was obtained by me for the telehealth visit today.  The patient does not have capability for a virtual visit.  A phone visit is therefore required today.   Date:  02/08/2019   ID:  [name] [name], DOB Jul 07, 1960, MRN [id number]  Location: patient's home  Provider location:  [facility]  Evaluation Performed: Follow-up visit  PCP:  Frederich Chick., MD   Electrophysiologist:  Dr Johney Frame  Chief Complaint:  ICD follow up  History of Present Illness:    [name] [name] is a 58 y.o. male who presents via telehealth conferencing today.  Since last being seen in our clinic, the patient reports doing very well.  Today, he denies symptoms of palpitations, chest pain, shortness of breath,  lower extremity edema, dizziness, presyncope, or syncope.  The patient is otherwise without complaint today.  The patient denies symptoms of fevers, chills, cough, or new SOB worrisome for COVID 19.  Past Medical History:  Diagnosis Date  . CAD (coronary artery disease)    anterior MI 2008, treated with bare metal stent to LAD  . Chronic systolic heart failure (HCC) 06/27/2010   Ischemic CM s/p ICD // Large anterior MI in 2008 tx with BMS to LAD // cMRI 6/10:  Ant, inf, septal, apical AK with full thickness scar, EF 23% // Echo 4/12: Inferoapical, apical, septal and dist ant AK, inferobasal HK, mild LVH, EF 25-30%, mild MR, mild LAE   . GERD (gastroesophageal reflux disease)   . HLD (hyperlipidemia)   . HTN (hypertension)   . Ischemic cardiomyopathy    NYHA Class II/III CHF  . MI (myocardial infarction) (HCC)   . 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 Umbilical hernia   . Ventricular tachycardia (HCC) 07/29/2018   received appropriate ATP therapy for VT at 218 bp with syncope     Past Surgical History:  Procedure Laterality Date  . CARDIAC DEFIBRILLATOR PLACEMENT  08/07/10   SJM Fortify VR ST implanted by Dr Johney Frame, part of the Analyze ST ICD study  . FINGER SURGERY  2/09   laceration repair    Current Outpatient Medications  Medication Sig Dispense Refill  . aspirin 81 MG tablet Take 81 mg by mouth once.     . carvedilol (COREG) 25 MG tablet Take 1 tablet (25 mg total) by mouth 2 (two) times daily with a meal. 60 tablet 0  . cholecalciferol (VITAMIN D) 1000 UNITS tablet Take 1,000 Units by mouth daily.    . fish oil-omega-3 fatty acids 1000 MG capsule Take 1 g by mouth daily.     Marland Kitchen lisinopril (PRINIVIL,ZESTRIL) 10 MG tablet Take 1 tablet (10 mg total) by mouth daily. 30 tablet 0  . Multiple Vitamin (MULTIVITAMIN) tablet Take 1 tablet by mouth daily.      . nitroGLYCERIN (NITROSTAT) 0.4 MG SL tablet Place 0.4 mg under the tongue every 5 (five) minutes x 3 doses as needed for chest pain.    . simvastatin (ZOCOR) 80 MG tablet TAKE 1/2 TABLET (40 MG TOTAL) BY MOUTH AT BEDTIME.    Marland Kitchen spironolactone (ALDACTONE) 25 MG tablet Take 0.5 tablets (12.5 mg total) by mouth daily. 30 tablet 0   No current facility-administered medications for this visit.     Allergies:  Patient has no known allergies.   Social History:  The patient  reports that he quit smoking about 12 years ago. His smoking use included cigars. He has never used smokeless tobacco. He reports current alcohol use. He reports that he does not use drugs.   Family History:  The patient's  family history includes Diabetes in his mother and unknown relative; Heart Problems in his father; Heart failure in his unknown relative; Hypertension in his mother.   ROS:  Please see the history of present illness.   All other systems are personally reviewed and negative.    Exam:    Vital Signs:  There were no vitals taken for this visit.  Well sounding,alert and conversant, regular work of breathing   Labs/Other  Tests and Data Reviewed:    Recent Labs: No results found for requested labs within last 8760 hours.   Wt Readings from Last 3 Encounters:  08/17/18 200 lb (90.7 kg)  03/17/18 208 lb 12.8 oz (94.7 kg)  01/14/18 217 lb 6.4 oz (98.6 kg)     Last device remote is reviewed from [place] PDF which reveals normal device function, no arrhythmias    ASSESSMENT & PLAN:    1.  VT No recent recurrence Continue current medications  2.  ICM/chronic systolic heart failure Stable by symptoms Continue current medications  3.  HTN Stable No change required today   Follow-up:  Merlin, EP NP 6 months    Patient Risk:  after full review of this patients clinical status, I feel that they are at moderate risk at this time.  Today, I have spent 15 minutes with the patient with telehealth technology discussing arrhythmia management .    [name], MD  02/08/2019 10:03 AM     [facility] [address] [place] [address] [phone] (office) [phone] (fax)

## 2019-02-24 ENCOUNTER — Ambulatory Visit (INDEPENDENT_AMBULATORY_CARE_PROVIDER_SITE_OTHER): Payer: Self-pay | Admitting: *Deleted

## 2019-02-24 DIAGNOSIS — I5022 Chronic systolic (congestive) heart failure: Secondary | ICD-10-CM

## 2019-02-25 LAB — CUP PACEART REMOTE DEVICE CHECK
Date Time Interrogation Session: 20201223065428
Implantable Lead Implant Date: 20120605
Implantable Lead Location: 753860
Implantable Pulse Generator Implant Date: 20120605
Pulse Gen Serial Number: 809738

## 2019-05-26 ENCOUNTER — Ambulatory Visit (INDEPENDENT_AMBULATORY_CARE_PROVIDER_SITE_OTHER): Payer: Self-pay | Admitting: *Deleted

## 2019-05-26 DIAGNOSIS — I5022 Chronic systolic (congestive) heart failure: Secondary | ICD-10-CM

## 2019-05-26 LAB — CUP PACEART REMOTE DEVICE CHECK
Battery Remaining Longevity: 26 mo
Battery Remaining Percentage: 22 %
Battery Voltage: 2.8 V
Brady Statistic RV Percent Paced: 1 %
Date Time Interrogation Session: 20210324072403
HighPow Impedance: 75 Ohm
HighPow Impedance: 75 Ohm
Implantable Lead Implant Date: 20120605
Implantable Lead Location: 753860
Implantable Pulse Generator Implant Date: 20120605
Lead Channel Impedance Value: 380 Ohm
Lead Channel Pacing Threshold Amplitude: 1 V
Lead Channel Pacing Threshold Pulse Width: 0.5 ms
Lead Channel Sensing Intrinsic Amplitude: 12 mV
Lead Channel Setting Pacing Amplitude: 2.5 V
Lead Channel Setting Pacing Pulse Width: 0.5 ms
Lead Channel Setting Sensing Sensitivity: 0.5 mV
Pulse Gen Serial Number: 809738

## 2019-05-27 NOTE — Progress Notes (Signed)
ICD Remote  

## 2019-08-26 ENCOUNTER — Telehealth: Payer: Self-pay

## 2019-08-26 NOTE — Telephone Encounter (Signed)
Left message for patient to remind of missed remote transmission.  

## 2019-09-02 ENCOUNTER — Other Ambulatory Visit: Payer: Self-pay

## 2019-09-02 ENCOUNTER — Encounter: Payer: Self-pay | Admitting: Student

## 2019-09-02 ENCOUNTER — Ambulatory Visit (INDEPENDENT_AMBULATORY_CARE_PROVIDER_SITE_OTHER): Payer: Self-pay | Admitting: Student

## 2019-09-02 VITALS — BP 118/68 | HR 66 | Ht 70.0 in | Wt 240.0 lb

## 2019-09-02 DIAGNOSIS — Z95810 Presence of automatic (implantable) cardiac defibrillator: Secondary | ICD-10-CM

## 2019-09-02 DIAGNOSIS — I472 Ventricular tachycardia, unspecified: Secondary | ICD-10-CM

## 2019-09-02 DIAGNOSIS — I5022 Chronic systolic (congestive) heart failure: Secondary | ICD-10-CM

## 2019-09-02 DIAGNOSIS — I10 Essential (primary) hypertension: Secondary | ICD-10-CM

## 2019-09-02 NOTE — Patient Instructions (Signed)
Medication Instructions:  *If you need a refill on your cardiac medications before your next appointment, please call your pharmacy*   Lab Work: If you have labs (blood work) drawn today and your tests are completely normal, you will receive your results only by: Marland Kitchen MyChart Message (if you have MyChart) OR . A paper copy in the mail If you have any lab test that is abnormal or we need to change your treatment, we will call you to review the results.  Follow-Up: At [facility], you and your health needs are our priority.  As part of our continuing mission to provide you with exceptional heart care, we have created designated Provider Care Teams.  These Care Teams include your primary Cardiologist (physician) and Advanced Practice Providers (APPs -  Physician Assistants and Nurse Practitioners) who all work together to provide you with the care you need, when you need it.  We recommend signing up for the patient portal called "MyChart".  Sign up information is provided on this After Visit Summary.  MyChart is used to connect with patients for Virtual Visits (Telemedicine).  Patients are able to view lab/test results, encounter notes, upcoming appointments, etc.  Non-urgent messages can be sent to your provider as well.   To learn more about what you can do with MyChart, go to [email or web address].    Your next appointment:   Your physician wants you to follow-up in: 6 MONTHS with Dr. Johney [name]. You will receive a reminder letter in the mail two months in advance. If you don't receive a letter, please call our office to schedule the follow-up appointment.  Remote monitoring is used to monitor your ICD from home. This monitoring reduces the number of office visits required to check your device to one time per year. It allows [place] to keep an eye on the functioning of your device to ensure it is working properly. You are scheduled for a device check from home on 11/24/19. You may send your transmission  at any time that day. If you have a wireless device, the transmission will be sent automatically. After your physician reviews your transmission, you will receive a postcard with your next transmission date.  The format for your next appointment:   In person with Dr. Johney [name]  Other Instructions [facility] Dr. Hillis Range Fax Num - [phone]

## 2019-09-02 NOTE — Progress Notes (Signed)
Electrophysiology Office Note Date: 09/02/2019  ID:  [name] [name], DOB [birth year], MRN [id number]  PCP: Frederich Chick., MD Primary Cardiologist: Tonny Bollman, MD Electrophysiologist: Hillis Range, MD   CC: Routine ICD follow-up  [name] [name] is a 59 y.o. male seen today for Hillis Range, MD for routine electrophysiology followup.  Since last being seen in our clinic the patient reports doing very well. He had labs drawn recently at [place]. No further syncopal episodes/device therapies.  he denies chest pain, palpitations, dyspnea, PND, orthopnea, nausea, vomiting, dizziness, syncope, edema, weight gain, or early satiety. He has not had ICD shocks.   Device History: St. Jude Single Chamber ICD implanted 08/07/2010 for Chronic systolic CHF History of appropriate therapy: Yes History of AAD therapy: No   Past Medical History:  Diagnosis Date  . CAD (coronary artery disease)    anterior MI 2008, treated with bare metal stent to LAD  . Chronic systolic heart failure (HCC) 06/27/2010   Ischemic CM s/p ICD // Large anterior MI in 2008 tx with BMS to LAD // cMRI 6/10:  Ant, inf, septal, apical AK with full thickness scar, EF 23% // Echo 4/12: Inferoapical, apical, septal and dist ant AK, inferobasal HK, mild LVH, EF 25-30%, mild MR, mild LAE   . GERD (gastroesophageal reflux disease)   . HLD (hyperlipidemia)   . HTN (hypertension)   . Ischemic cardiomyopathy    NYHA Class II/III CHF  . MI (myocardial infarction) (HCC)   . 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 Umbilical hernia   . Ventricular tachycardia (HCC) 07/29/2018   received appropriate ATP therapy for VT at 218 bp with syncope   Past Surgical History:  Procedure Laterality Date  . CARDIAC DEFIBRILLATOR PLACEMENT  08/07/10   SJM Fortify VR ST implanted by Dr Johney Frame, part of the Analyze ST ICD study  . FINGER SURGERY  2/09   laceration repair    Current Outpatient Medications  Medication Sig Dispense Refill   . aspirin 81 MG tablet Take 81 mg by mouth once.     . carvedilol (COREG) 25 MG tablet Take 1 tablet (25 mg total) by mouth 2 (two) times daily with a meal. 60 tablet 0  . cholecalciferol (VITAMIN D) 1000 UNITS tablet Take 1,000 Units by mouth daily.    . fish oil-omega-3 fatty acids 1000 MG capsule Take 1 g by mouth daily.     Marland Kitchen lisinopril (PRINIVIL,ZESTRIL) 10 MG tablet Take 1 tablet (10 mg total) by mouth daily. 30 tablet 0  . Multiple Vitamin (MULTIVITAMIN) tablet Take 1 tablet by mouth daily.      . nitroGLYCERIN (NITROSTAT) 0.4 MG SL tablet Place 0.4 mg under the tongue every 5 (five) minutes x 3 doses as needed for chest pain.    . simvastatin (ZOCOR) 80 MG tablet TAKE 1/2 TABLET (40 MG TOTAL) BY MOUTH AT BEDTIME.    Marland Kitchen spironolactone (ALDACTONE) 25 MG tablet Take 0.5 tablets (12.5 mg total) by mouth daily. 30 tablet 0   No current facility-administered medications for this visit.    Allergies:   Patient has no known allergies.   Social History: Social History   Socioeconomic History  . Marital status: Married    Spouse name: Not on file  . Number of children: Not on file  . Years of education: Not on file  . Highest education level: Not on file  Occupational History  . Occupation: truck Hospital doctor  Tobacco Use  .  Smoking status: Former Smoker    Types: Cigars    Quit date: 03/04/2006    Years since quitting: 13.5  . Smokeless tobacco: Never Used  Vaping Use  . Vaping Use: Never used  Substance and Sexual Activity  . Alcohol use: Yes    Comment: occasionally  . Drug use: No  . Sexual activity: Not on file  Other Topics Concern  . Not on file  Social History Narrative   Lives in [place] with spouse.  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   Social Determinants of Health   Financial Resource Strain:   . Difficulty of Paying Living Expenses:   Food Insecurity:   . Worried About Programme researcher, broadcasting/film/video in the  Last Year:   . Barista in the Last Year:   Transportation Needs:   . Freight forwarder (Medical):   Marland Kitchen Lack of Transportation (Non-Medical):   Physical Activity:   . Days of Exercise per Week:   . Minutes of Exercise per Session:   Stress:   . Feeling of Stress :   Social Connections:   . Frequency of Communication with Friends and Family:   . Frequency of Social Gatherings with Friends and Family:   . Attends Religious Services:   . Active Member of Clubs or Organizations:   . Attends Banker Meetings:   Marland Kitchen Marital Status:   Intimate Partner Violence:   . Fear of Current or Ex-Partner:   . Emotionally Abused:   Marland Kitchen Physically Abused:   . Sexually Abused:     Family History: Family History  Problem Relation Age of Onset  . Hypertension Mother   . Diabetes Mother   . Heart Problems Father   . Heart failure Unknown   . Diabetes Unknown     Review of Systems: All other systems reviewed and are otherwise negative except as noted above.   Physical Exam: Vitals:   09/02/19 0902  BP: 118/68  Pulse: 66  SpO2: 96%  Weight: 240 lb (108.9 kg)  Height: 5\' 10"  (1.778 m)     GEN- The patient is well appearing, alert and oriented x 3 today.   HEENT: normocephalic, atraumatic; sclera clear, conjunctiva pink; hearing intact; oropharynx clear; neck supple, no JVP Lymph- no cervical lymphadenopathy Lungs- Clear to ausculation bilaterally, normal work of breathing.  No wheezes, rales, rhonchi Heart- Regular rate and rhythm, no murmurs, rubs or gallops, PMI not laterally displaced GI- soft, non-tender, non-distended, bowel sounds present, no hepatosplenomegaly Extremities- no clubbing, cyanosis, or edema; DP/PT/radial pulses 2+ bilaterally MS- no significant deformity or atrophy Skin- warm and dry, no rash or lesion; ICD pocket well healed Psych- euthymic mood, full affect Neuro- strength and sensation are intact  ICD interrogation- reviewed in detail  today,  See PACEART report  EKG:  EKG is ordered today. The ekg ordered today shows NSR at 66 bpm with normal intervals  Recent Labs: No results found for requested labs within last 8760 hours.   Wt Readings from Last 3 Encounters:  09/02/19 240 lb (108.9 kg)  08/17/18 200 lb (90.7 kg)  03/17/18 208 lb 12.8 oz (94.7 kg)     Other studies Reviewed: Additional studies/ records that were reviewed today include: Previous EP office notes   Assessment and Plan:  1.  Chronic systolic dysfunction s/p St. Jude single chamber ICD  euvolemic today Stable on an appropriate medical regimen Normal ICD function See 03/19/18  Art report No changes today He will provide recent labs from [place].   2. VT No recent recurrence Per previous notes, if recurs consider sotalol vs coreg titration  3. HTN Stable. No change required today  Current medicines are reviewed at length with the patient today.   The patient does not have concerns regarding his medicines.  The following changes were made today:  none  Labs/ tests ordered today include:  Orders Placed This Encounter  Procedures  . EKG 12-Lead   Disposition:   Follow up with Dr. Johney Frame in 6 months.   Dustin Flock, PA-C  09/02/2019 9:32 AM  [facility] [address] [place] [address] [phone] (office) [phone] (fax)

## 2019-09-08 ENCOUNTER — Ambulatory Visit (INDEPENDENT_AMBULATORY_CARE_PROVIDER_SITE_OTHER): Payer: Self-pay | Admitting: *Deleted

## 2019-09-08 DIAGNOSIS — I5022 Chronic systolic (congestive) heart failure: Secondary | ICD-10-CM

## 2019-09-08 LAB — CUP PACEART REMOTE DEVICE CHECK
Battery Remaining Longevity: 23 mo
Battery Remaining Percentage: 19 %
Battery Voltage: 2.77 V
Brady Statistic RV Percent Paced: 1 %
Date Time Interrogation Session: 20210707055639
HighPow Impedance: 86 Ohm
HighPow Impedance: 86 Ohm
Implantable Lead Implant Date: 20120605
Implantable Lead Location: 753860
Implantable Pulse Generator Implant Date: 20120605
Lead Channel Impedance Value: 440 Ohm
Lead Channel Pacing Threshold Amplitude: 1 V
Lead Channel Pacing Threshold Pulse Width: 0.5 ms
Lead Channel Sensing Intrinsic Amplitude: 12 mV
Lead Channel Setting Pacing Amplitude: 2.5 V
Lead Channel Setting Pacing Pulse Width: 0.5 ms
Lead Channel Setting Sensing Sensitivity: 0.5 mV
Pulse Gen Serial Number: 809738

## 2019-09-09 NOTE — Progress Notes (Signed)
Remote ICD transmission.   

## 2019-12-08 ENCOUNTER — Ambulatory Visit (INDEPENDENT_AMBULATORY_CARE_PROVIDER_SITE_OTHER): Payer: Self-pay

## 2019-12-08 DIAGNOSIS — I5022 Chronic systolic (congestive) heart failure: Secondary | ICD-10-CM

## 2019-12-10 LAB — CUP PACEART REMOTE DEVICE CHECK
Battery Remaining Longevity: 19 mo
Battery Remaining Percentage: 16 %
Battery Voltage: 2.74 V
Brady Statistic RV Percent Paced: 1 %
Date Time Interrogation Session: 20211007125638
HighPow Impedance: 74 Ohm
HighPow Impedance: 74 Ohm
Implantable Lead Implant Date: 20120605
Implantable Lead Location: 753860
Implantable Pulse Generator Implant Date: 20120605
Lead Channel Impedance Value: 410 Ohm
Lead Channel Pacing Threshold Amplitude: 1 V
Lead Channel Pacing Threshold Pulse Width: 0.5 ms
Lead Channel Sensing Intrinsic Amplitude: 12 mV
Lead Channel Setting Pacing Amplitude: 2.5 V
Lead Channel Setting Pacing Pulse Width: 0.5 ms
Lead Channel Setting Sensing Sensitivity: 0.5 mV
Pulse Gen Serial Number: 809738

## 2019-12-13 NOTE — Progress Notes (Signed)
Remote ICD transmission.   

## 2020-03-08 ENCOUNTER — Ambulatory Visit (INDEPENDENT_AMBULATORY_CARE_PROVIDER_SITE_OTHER): Payer: Self-pay

## 2020-03-08 DIAGNOSIS — I255 Ischemic cardiomyopathy: Secondary | ICD-10-CM

## 2020-03-09 LAB — CUP PACEART REMOTE DEVICE CHECK
Battery Remaining Longevity: 16 mo
Battery Remaining Percentage: 13 %
Battery Voltage: 2.71 V
Brady Statistic RV Percent Paced: 1 %
Date Time Interrogation Session: 20220106045426
HighPow Impedance: 80 Ohm
HighPow Impedance: 80 Ohm
Implantable Lead Implant Date: 20120605
Implantable Lead Location: 753860
Implantable Pulse Generator Implant Date: 20120605
Lead Channel Impedance Value: 440 Ohm
Lead Channel Pacing Threshold Amplitude: 1 V
Lead Channel Pacing Threshold Pulse Width: 0.5 ms
Lead Channel Sensing Intrinsic Amplitude: 12 mV
Lead Channel Setting Pacing Amplitude: 2.5 V
Lead Channel Setting Pacing Pulse Width: 0.5 ms
Lead Channel Setting Sensing Sensitivity: 0.5 mV
Pulse Gen Serial Number: 809738

## 2020-03-23 NOTE — Progress Notes (Signed)
Remote ICD transmission.   

## 2020-06-07 ENCOUNTER — Ambulatory Visit (INDEPENDENT_AMBULATORY_CARE_PROVIDER_SITE_OTHER): Payer: Self-pay

## 2020-06-07 ENCOUNTER — Ambulatory Visit: Payer: Self-pay

## 2020-06-07 DIAGNOSIS — I255 Ischemic cardiomyopathy: Secondary | ICD-10-CM

## 2020-06-08 ENCOUNTER — Telehealth: Payer: Self-pay

## 2020-06-08 LAB — CUP PACEART REMOTE DEVICE CHECK
Battery Remaining Longevity: 11 mo
Battery Remaining Longevity: 11 mo
Battery Remaining Percentage: 9 %
Battery Remaining Percentage: 9 %
Battery Voltage: 2.66 V
Battery Voltage: 2.66 V
Brady Statistic RV Percent Paced: 1 %
Brady Statistic RV Percent Paced: 1 %
Date Time Interrogation Session: 20220406072805
Date Time Interrogation Session: 20220406072805
HighPow Impedance: 77 Ohm
HighPow Impedance: 77 Ohm
HighPow Impedance: 77 Ohm
HighPow Impedance: 77 Ohm
Implantable Lead Implant Date: 20120605
Implantable Lead Implant Date: 20120605
Implantable Lead Location: 753860
Implantable Lead Location: 753860
Implantable Pulse Generator Implant Date: 20120605
Implantable Pulse Generator Implant Date: 20120605
Lead Channel Impedance Value: 390 Ohm
Lead Channel Impedance Value: 390 Ohm
Lead Channel Pacing Threshold Amplitude: 1 V
Lead Channel Pacing Threshold Amplitude: 1 V
Lead Channel Pacing Threshold Pulse Width: 0.5 ms
Lead Channel Pacing Threshold Pulse Width: 0.5 ms
Lead Channel Sensing Intrinsic Amplitude: 12 mV
Lead Channel Sensing Intrinsic Amplitude: 12 mV
Lead Channel Setting Pacing Amplitude: 2.5 V
Lead Channel Setting Pacing Amplitude: 2.5 V
Lead Channel Setting Pacing Pulse Width: 0.5 ms
Lead Channel Setting Pacing Pulse Width: 0.5 ms
Lead Channel Setting Sensing Sensitivity: 0.5 mV
Lead Channel Setting Sensing Sensitivity: 0.5 mV
Pulse Gen Serial Number: 809738
Pulse Gen Serial Number: 809738

## 2020-06-08 NOTE — Telephone Encounter (Signed)
Monthly battery checks increased in Epic and Merlin. Attempted to call patient to advise. No answer, LMOVM.  

## 2020-06-16 NOTE — Telephone Encounter (Signed)
Contacted patient in reference to monthly battery checks. Last remote on 06/07/20 shows 10.7 months until ERI. Patient was explained the process and why we do monthly battery checks. Patient verbalized understanding.

## 2020-06-20 NOTE — Progress Notes (Signed)
Remote ICD transmission.   

## 2020-07-10 ENCOUNTER — Ambulatory Visit (INDEPENDENT_AMBULATORY_CARE_PROVIDER_SITE_OTHER): Payer: Self-pay

## 2020-07-10 DIAGNOSIS — I5022 Chronic systolic (congestive) heart failure: Secondary | ICD-10-CM

## 2020-07-10 DIAGNOSIS — I255 Ischemic cardiomyopathy: Secondary | ICD-10-CM

## 2020-07-11 LAB — CUP PACEART REMOTE DEVICE CHECK
Battery Remaining Longevity: 11 mo
Battery Remaining Percentage: 9 %
Battery Voltage: 2.66 V
Brady Statistic RV Percent Paced: 1 %
Date Time Interrogation Session: 20220509193746
HighPow Impedance: 71 Ohm
HighPow Impedance: 71 Ohm
Implantable Lead Implant Date: 20120605
Implantable Lead Location: 753860
Implantable Pulse Generator Implant Date: 20120605
Lead Channel Impedance Value: 410 Ohm
Lead Channel Pacing Threshold Amplitude: 1 V
Lead Channel Pacing Threshold Pulse Width: 0.5 ms
Lead Channel Sensing Intrinsic Amplitude: 12 mV
Lead Channel Setting Pacing Amplitude: 2.5 V
Lead Channel Setting Pacing Pulse Width: 0.5 ms
Lead Channel Setting Sensing Sensitivity: 0.5 mV
Pulse Gen Serial Number: 809738

## 2020-07-27 NOTE — Addendum Note (Signed)
Addended by: Geralyn Flash D on: 07/27/2020 04:23 PM   Modules accepted: Level of Service

## 2020-07-27 NOTE — Progress Notes (Signed)
Remote ICD transmission.   

## 2020-08-10 ENCOUNTER — Ambulatory Visit (INDEPENDENT_AMBULATORY_CARE_PROVIDER_SITE_OTHER): Payer: Self-pay

## 2020-08-10 DIAGNOSIS — I255 Ischemic cardiomyopathy: Secondary | ICD-10-CM

## 2020-08-14 LAB — CUP PACEART REMOTE DEVICE CHECK
Battery Remaining Longevity: 11 mo
Battery Remaining Percentage: 9 %
Battery Voltage: 2.66 V
Brady Statistic RV Percent Paced: 1 %
Date Time Interrogation Session: 20220611194800
HighPow Impedance: 75 Ohm
HighPow Impedance: 75 Ohm
Implantable Lead Implant Date: 20120605
Implantable Lead Location: 753860
Implantable Pulse Generator Implant Date: 20120605
Lead Channel Impedance Value: 380 Ohm
Lead Channel Pacing Threshold Amplitude: 1 V
Lead Channel Pacing Threshold Pulse Width: 0.5 ms
Lead Channel Sensing Intrinsic Amplitude: 12 mV
Lead Channel Setting Pacing Amplitude: 2.5 V
Lead Channel Setting Pacing Pulse Width: 0.5 ms
Lead Channel Setting Sensing Sensitivity: 0.5 mV
Pulse Gen Serial Number: 809738

## 2020-08-30 ENCOUNTER — Telehealth: Payer: Self-pay

## 2020-08-30 NOTE — Telephone Encounter (Signed)
Merlin alert (1) VF episode @ 220's bpm x 14 sec. ATP x 1 successful.   Patient called and advised. He was asleep and unaware of event. Patient reports compliance with medications on file. Is over due with Dr. Johney Frame. Advised patient I will forward to scheduling to have patient called with apt. In clinic. Routing to Dr. Johney Frame for review.  [place] driving restrictions discussed with patient x6 months from ATP date. Shock plan reviewed.

## 2020-09-01 NOTE — Addendum Note (Signed)
Addended by: Elease Etienne A on: 09/01/2020 01:01 PM   Modules accepted: Level of Service

## 2020-09-01 NOTE — Progress Notes (Signed)
Remote ICD transmission.   

## 2020-09-10 ENCOUNTER — Ambulatory Visit (INDEPENDENT_AMBULATORY_CARE_PROVIDER_SITE_OTHER): Payer: Self-pay

## 2020-09-10 DIAGNOSIS — I5022 Chronic systolic (congestive) heart failure: Secondary | ICD-10-CM

## 2020-09-10 DIAGNOSIS — I255 Ischemic cardiomyopathy: Secondary | ICD-10-CM

## 2020-09-11 LAB — CUP PACEART REMOTE DEVICE CHECK
Battery Remaining Longevity: 9 mo
Battery Remaining Percentage: 7 %
Battery Voltage: 2.66 V
Brady Statistic RV Percent Paced: 1 %
Date Time Interrogation Session: 20220710035649
HighPow Impedance: 71 Ohm
HighPow Impedance: 71 Ohm
Implantable Lead Implant Date: 20120605
Implantable Lead Location: 753860
Implantable Pulse Generator Implant Date: 20120605
Lead Channel Impedance Value: 400 Ohm
Lead Channel Pacing Threshold Amplitude: 1 V
Lead Channel Pacing Threshold Pulse Width: 0.5 ms
Lead Channel Sensing Intrinsic Amplitude: 12 mV
Lead Channel Setting Pacing Amplitude: 2.5 V
Lead Channel Setting Pacing Pulse Width: 0.5 ms
Lead Channel Setting Sensing Sensitivity: 0.5 mV
Pulse Gen Serial Number: 809738

## 2020-10-11 ENCOUNTER — Ambulatory Visit (INDEPENDENT_AMBULATORY_CARE_PROVIDER_SITE_OTHER): Payer: Self-pay | Admitting: Internal Medicine

## 2020-10-11 ENCOUNTER — Ambulatory Visit (INDEPENDENT_AMBULATORY_CARE_PROVIDER_SITE_OTHER): Payer: Self-pay

## 2020-10-11 ENCOUNTER — Encounter: Payer: Self-pay | Admitting: Internal Medicine

## 2020-10-11 ENCOUNTER — Other Ambulatory Visit: Payer: Self-pay

## 2020-10-11 VITALS — BP 132/76 | HR 61 | Ht 70.0 in | Wt 242.6 lb

## 2020-10-11 DIAGNOSIS — I5022 Chronic systolic (congestive) heart failure: Secondary | ICD-10-CM

## 2020-10-11 DIAGNOSIS — I472 Ventricular tachycardia, unspecified: Secondary | ICD-10-CM

## 2020-10-11 DIAGNOSIS — I10 Essential (primary) hypertension: Secondary | ICD-10-CM

## 2020-10-11 DIAGNOSIS — I255 Ischemic cardiomyopathy: Secondary | ICD-10-CM

## 2020-10-11 MED ORDER — ENTRESTO 24-26 MG PO TABS: 1.0000 | ORAL_TABLET | Freq: Two times a day (BID) | ORAL | 3 refills | Status: DC

## 2020-10-11 NOTE — Progress Notes (Signed)
Remote ICD transmission.   

## 2020-10-11 NOTE — Patient Instructions (Addendum)
Medication Instructions:  Stop Lisinopril  Start Entersto 24-26 mg 48 hours after last dose of Lisinopril Your physician recommends that you continue on your current medications as directed. Please refer to the Current Medication list given to you today.  Labwork: BMP, Pro BNP  Testing/Procedures: Your physician has requested that you have an echocardiogram. Echocardiography is a painless test that uses sound waves to create images of your heart. It provides your doctor with information about the size and shape of your heart and how well your heart's chambers and valves are working. This procedure takes approximately one hour. There are no restrictions for this procedure.   Follow-Up: Your physician wants you to follow-up in: Follow up with Dr. Excell Seltzer or Tereso Newcomer. 6 months with Dr. Johney Frame.  Remote monitoring is used to monitor your ICD from home. This monitoring reduces the number of office visits required to check your device to one time per year. It allows [place] to keep an eye on the functioning of your device to ensure it is working properly. You are scheduled for a device check from home on 11/11/20. You may send your transmission at any time that day. If you have a wireless device, the transmission will be sent automatically. After your physician reviews your transmission, you will receive a postcard with your next transmission date.  Any Other Special Instructions Will Be Listed Below (If Applicable).  If you need a refill on your cardiac medications before your next appointment, please call your pharmacy.

## 2020-10-11 NOTE — Progress Notes (Signed)
PCP: Frederich Chick., MD Primary Cardiologist: Dr Excell Seltzer Primary EP: Dr Johney Frame  [name] [name] is a 60 y.o. male who presents today for routine electrophysiology followup.  Since last being seen in our clinic, the patient reports doing very well.  Today, he denies symptoms of palpitations, chest pain, shortness of breath,  lower extremity edema, dizziness, presyncope, syncope, or ICD shocks.  The patient is otherwise without complaint today.   Past Medical History:  Diagnosis Date   CAD (coronary artery disease)    anterior MI 2008, treated with bare metal stent to LAD   Chronic systolic heart failure (HCC) 06/27/2010   Ischemic CM s/p ICD // Large anterior MI in 2008 tx with BMS to LAD // cMRI 6/10:  Ant, inf, septal, apical AK with full thickness scar, EF 23% // Echo 4/12: Inferoapical, apical, septal and dist ant AK, inferobasal HK, mild LVH, EF 25-30%, mild MR, mild LAE    GERD (gastroesophageal reflux disease)    HLD (hyperlipidemia)    HTN (hypertension)    Ischemic cardiomyopathy    NYHA Class II/III CHF   MI (myocardial infarction) (HCC)    Pericarditis; 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   Umbilical hernia    Ventricular tachycardia (HCC) 07/29/2018   received appropriate ATP therapy for VT at 218 bp with syncope   Past Surgical History:  Procedure Laterality Date   CARDIAC DEFIBRILLATOR PLACEMENT  08/07/10   SJM Fortify VR ST implanted by Dr Johney Frame, part of the Analyze ST ICD study   FINGER SURGERY  2/09   laceration repair    ROS- all systems are reviewed and negative except as per HPI above  Current Outpatient Medications  Medication Sig Dispense Refill   aspirin 81 MG tablet Take 81 mg by mouth once.      carvedilol (COREG) 25 MG tablet Take 1 tablet (25 mg total) by mouth 2 (two) times daily with a meal. 60 tablet 0   cholecalciferol (VITAMIN D) 1000 UNITS tablet Take 1,000 Units by mouth daily.     fish oil-omega-3 fatty acids 1000 MG capsule Take 1 g by mouth  daily.      lisinopril (PRINIVIL,ZESTRIL) 10 MG tablet Take 1 tablet (10 mg total) by mouth daily. 30 tablet 0   Multiple Vitamin (MULTIVITAMIN) tablet Take 1 tablet by mouth daily.       nitroGLYCERIN (NITROSTAT) 0.4 MG SL tablet Place 0.4 mg under the tongue every 5 (five) minutes x 3 doses as needed for chest pain.     simvastatin (ZOCOR) 80 MG tablet TAKE 1/2 TABLET (40 MG TOTAL) BY MOUTH AT BEDTIME.     spironolactone (ALDACTONE) 25 MG tablet Take 0.5 tablets (12.5 mg total) by mouth daily. 30 tablet 0   No current facility-administered medications for this visit.    Physical Exam: There were no vitals filed for this visit.  GEN- The patient is well appearing, alert and oriented x 3 today.   Head- normocephalic, atraumatic Eyes-  Sclera clear, conjunctiva pink Ears- hearing intact Oropharynx- clear Lungs- Clear to ausculation bilaterally, normal work of breathing Chest- ICD pocket is well healed Heart- Regular rate and rhythm, no murmurs, rubs or gallops, PMI not laterally displaced GI- soft, NT, ND, + BS Extremities- no clubbing, cyanosis, or edema  ICD interrogation- reviewed in detail today,  See PACEART report  ekg tracing ordered today is personally reviewed and shows sinus rhythm, narrow QRS  Wt Readings from Last 3 Encounters:  09/02/19  240 lb (108.9 kg)  08/17/18 200 lb (90.7 kg)  03/17/18 208 lb 12.8 oz (94.7 kg)    Assessment and Plan:  1.  Chronic systolic dysfunction/ VT/ CAD euvolemic today He had a single episode of VT (CL 250 msec) successfully treated with ATP x 1 (08/29/20). Stable on an appropriate medical regimen Normal ICD function See Pace Art report No changes today he is not device dependant today Stop lisinopril Start entresto with pharmacy to assist with titration.  Once at goal, consider addition of SGTL2 inhibitor Update echo Bmet, ProBNP ordered today  Approaching ERI (7 months) Risks, benefits, and alternatives to ICD pulse generator  replacement were discussed in detail today.  The patient understands that risks include but are not limited to bleeding, infection, pneumothorax, perforation, tamponade, vascular damage, renal failure, MI, stroke, death, inappropriate shocks, damage to his existing leads, and lead dislodgement and wishes to proceed once ERI.  If he reaches ERI prior to my next office visit, ok to schedule.   2. HTN Stable No change required today   Risks, benefits and potential toxicities for medications prescribed and/or refilled reviewed with patient today.   Return to see me in 6 months Will need to followup with general cardiology  Hillis Range MD, [facility] 10/11/2020 3:29 PM

## 2020-10-12 LAB — CUP PACEART REMOTE DEVICE CHECK
Battery Remaining Longevity: 7 mo
Battery Remaining Percentage: 6 %
Battery Voltage: 2.63 V
Brady Statistic RV Percent Paced: 1 %
Date Time Interrogation Session: 20220810020622
HighPow Impedance: 78 Ohm
HighPow Impedance: 78 Ohm
Implantable Lead Implant Date: 20120605
Implantable Lead Location: 753860
Implantable Pulse Generator Implant Date: 20120605
Lead Channel Impedance Value: 410 Ohm
Lead Channel Pacing Threshold Amplitude: 1 V
Lead Channel Pacing Threshold Pulse Width: 0.5 ms
Lead Channel Sensing Intrinsic Amplitude: 12 mV
Lead Channel Setting Pacing Amplitude: 2.5 V
Lead Channel Setting Pacing Pulse Width: 0.5 ms
Lead Channel Setting Sensing Sensitivity: 0.5 mV
Pulse Gen Serial Number: 809738

## 2020-10-12 LAB — BASIC METABOLIC PANEL
BUN/Creatinine Ratio: 13 (ref 10–24)
BUN: 13 mg/dL (ref 8–27)
CO2: 23 mmol/L (ref 20–29)
Calcium: 9.2 mg/dL (ref 8.6–10.2)
Chloride: 102 mmol/L (ref 96–106)
Creatinine, Ser: 0.98 mg/dL (ref 0.76–1.27)
Glucose: 97 mg/dL (ref 65–99)
Potassium: 4.4 mmol/L (ref 3.5–5.2)
Sodium: 141 mmol/L (ref 134–144)
eGFR: 88 mL/min/{1.73_m2} (ref >59)

## 2020-10-12 LAB — PRO B NATRIURETIC PEPTIDE: NT-Pro BNP: 82 pg/mL (ref 0–210)

## 2020-10-13 ENCOUNTER — Telehealth: Payer: Self-pay | Admitting: Internal Medicine

## 2020-10-13 NOTE — Telephone Encounter (Signed)
[facility] and was told he could give them a letter from Dr. Johney Frame about the need for the medication change and the prescription, so they can give it to him.

## 2020-10-13 NOTE — Telephone Encounter (Signed)
Pt c/o medication issue:  1. Name of Medication: sacubitril-valsartan (ENTRESTO) 24-26 MG  2. How are you currently taking this medication (dosage and times per day)? 1 table by 2 twice a day  3. Are you having a reaction (difficulty breathing--STAT)? no  4. What is your medication issue? Patient cant afford this medication. Wants to know if he can be switch bout to the other medication or is there another medication the patient can take. Please advise.

## 2020-10-16 MED ORDER — ENTRESTO 24-26 MG PO TABS: 1.0000 | ORAL_TABLET | Freq: Two times a day (BID) | ORAL | 3 refills | Status: DC

## 2020-10-16 NOTE — Telephone Encounter (Signed)
Notified patient's wife [name] [name] [facility]) his paper work was at the front desk to pick up.  Verbalized understanding.

## 2020-10-16 NOTE — Telephone Encounter (Signed)
**Note De-Identified  Obfuscation** I have typed up the letter explaining the pts need for [facility] for him to present to the [place].as requested by the pt. I have forwarded the letter to Dr Jenel Lucks nurse with a request to have Dr Johney Frame sign both the letter and a Entresto 24-26 mg RX and to leave up front or the pt to pick up.

## 2020-10-16 NOTE — Addendum Note (Signed)
Addended by: Sampson Goon on: 10/16/2020 01:38 PM   Modules accepted: Orders

## 2020-10-31 ENCOUNTER — Ambulatory Visit (HOSPITAL_COMMUNITY): Payer: Self-pay | Attending: Cardiovascular Disease

## 2020-10-31 ENCOUNTER — Other Ambulatory Visit: Payer: Self-pay

## 2020-10-31 ENCOUNTER — Ambulatory Visit (INDEPENDENT_AMBULATORY_CARE_PROVIDER_SITE_OTHER): Payer: Self-pay | Admitting: Pharmacist

## 2020-10-31 VITALS — BP 110/72 | HR 69 | Wt 241.0 lb

## 2020-10-31 DIAGNOSIS — I472 Ventricular tachycardia, unspecified: Secondary | ICD-10-CM

## 2020-10-31 DIAGNOSIS — I10 Essential (primary) hypertension: Secondary | ICD-10-CM | POA: Insufficient documentation

## 2020-10-31 DIAGNOSIS — I5022 Chronic systolic (congestive) heart failure: Secondary | ICD-10-CM | POA: Insufficient documentation

## 2020-10-31 DIAGNOSIS — I255 Ischemic cardiomyopathy: Secondary | ICD-10-CM | POA: Insufficient documentation

## 2020-10-31 LAB — ECHOCARDIOGRAM COMPLETE
Area-P 1/2: 2.87 cm2
S' Lateral: 3.4 cm

## 2020-10-31 MED ORDER — PERFLUTREN LIPID MICROSPHERE
1.0000 mL | INTRAVENOUS | Status: AC | PRN
  Administered 2020-10-31: 3 mL via INTRAVENOUS

## 2020-10-31 MED ORDER — NITROGLYCERIN 0.4 MG SL SUBL: 0.4000 mg | SUBLINGUAL_TABLET | SUBLINGUAL | 1 refills | Status: DC | PRN

## 2020-10-31 NOTE — Patient Instructions (Addendum)
Please start checking your blood pressure at home once a day Please record these readings and bring them with you to your next appointment Please also bring your blood pressure machine  Continue Entresto 24/26 twice a day, Spironolactone 12.5mg  daily, carvedilol 25mg  twice a day  Follow up in clinic on 9/13  Please call me at [phone] with any questions

## 2020-10-31 NOTE — Progress Notes (Signed)
Patient ID: RONTAE [name]                 DOB: 1960-05-02                      MRN: [id number]     HPI: [name] [name] is a 60 y.o. male referred by Dr. Johney Frame to PharmD clinic. PMH is significant for CAD s/p MI, CHF s/p ICD, GERD, HLD and VT successfully treated with ATP x 1 (08/29/20). EF 25-30%. Repeat echo today.  At last visit with Dr. Johney Frame 8/10 patient's lisinopril was changed to [facility]. Patient uses the [place] and a letter was written to the [place] so they would cover medication.  Patient presents today to PharmD clinic. He states that he was able to get the [place] from the [place]. He started 2-3 days ago. He states he has a little bit of dizziness but nothing bad. Denies SOB or swelling. States he only gets SOB if it is excessively hot or cold outside. Is active, but plans to start walking again. Started a partial keto diet last week. Can complete all ADL. Weights himself at home, but not everyday. Has not been checking BP at home recently.   Current CHF meds: Entresto 24/26 twice a day, spironolactone 12.5mg  daily, carvedilol 25mg  twice a day Previously tried: lisinopril BP goal: <130/80  Family History:  Family History  Problem Relation Age of Onset   Hypertension Mother    Diabetes Mother    Heart Problems Father    Heart failure Unknown    Diabetes Unknown     Social History: former smoker, ETOH 2 beers 2-3 times a week, no illict drugs  Diet: started last week a partial keto diet  Exercise: yard work, going to start back going for walks  Home BP readings: none  Wt Readings from Last 3 Encounters:  10/11/20 242 lb 9.6 oz (110 kg)  09/02/19 240 lb (108.9 kg)  08/17/18 200 lb (90.7 kg)   BP Readings from Last 3 Encounters:  10/11/20 132/76  09/02/19 118/68  03/17/18 110/78   Pulse Readings from Last 3 Encounters:  10/11/20 61  09/02/19 66  03/17/18 70    Renal function: CrCl cannot be calculated (Unknown ideal weight.).  Past Medical History:   Diagnosis Date   CAD (coronary artery disease)    anterior MI 2008, treated with bare metal stent to LAD   Chronic systolic heart failure (HCC) 06/27/2010   Ischemic CM s/p ICD // Large anterior MI in 2008 tx with BMS to LAD // cMRI 6/10:  Ant, inf, septal, apical AK with full thickness scar, EF 23% // Echo 4/12: Inferoapical, apical, septal and dist ant AK, inferobasal HK, mild LVH, EF 25-30%, mild MR, mild LAE    GERD (gastroesophageal reflux disease)    HLD (hyperlipidemia)    HTN (hypertension)    Ischemic cardiomyopathy    NYHA Class II/III CHF   MI (myocardial infarction) (HCC)    Pericarditis; 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   Umbilical hernia    Ventricular tachycardia (HCC) 07/29/2018   received appropriate ATP therapy for VT at 218 bp with syncope    Current Outpatient Medications on File Prior to Visit  Medication Sig Dispense Refill   aspirin 81 MG tablet Take 81 mg by mouth once.      carvedilol (COREG) 25 MG tablet Take 1 tablet (25 mg total) by mouth 2 (two) times daily with a meal. 60 tablet  0   cholecalciferol (VITAMIN D) 1000 UNITS tablet Take 1,000 Units by mouth daily.     fish oil-omega-3 fatty acids 1000 MG capsule Take 1 g by mouth daily.      Multiple Vitamin (MULTIVITAMIN) tablet Take 1 tablet by mouth daily.       nitroGLYCERIN (NITROSTAT) 0.4 MG SL tablet Place 0.4 mg under the tongue every 5 (five) minutes x 3 doses as needed for chest pain.     sacubitril-valsartan (ENTRESTO) 24-26 MG Take 1 tablet by mouth 2 (two) times daily. 180 tablet 3   simvastatin (ZOCOR) 80 MG tablet TAKE 1/2 TABLET (40 MG TOTAL) BY MOUTH AT BEDTIME.     spironolactone (ALDACTONE) 25 MG tablet Take 0.5 tablets (12.5 mg total) by mouth daily. 30 tablet 0   No current facility-administered medications on file prior to visit.    No Known Allergies  There were no vitals taken for this visit.   Assessment/Plan:  1. CHF - BP well controlled in clinic today. Patient has only been on  Entresto for a few days. No home readings available. I have asked patient to start checking his blood pressure at home, bring his readings with him along with his BP machine to next visit. Follow up in 2 weeks. We will check BMP at that visit. Encouraged him to start going for brisk walks, increase time as able. Continue Entresto 24/26 twice a day, Spironolactone 12.5mg  daily, carvedilol 25mg  twice a day  Thank you  , Pharm.D, BCPS, CPP [facility]  [address], [place], [place]  Phone: [phone]; Fax: [phone]

## 2020-11-02 NOTE — Progress Notes (Signed)
Remote ICD transmission.   

## 2020-11-02 NOTE — Addendum Note (Signed)
Addended by: Cleda Mccreedy on: 11/02/2020 02:09 PM   Modules accepted: Level of Service

## 2020-11-13 ENCOUNTER — Ambulatory Visit (INDEPENDENT_AMBULATORY_CARE_PROVIDER_SITE_OTHER): Payer: Self-pay

## 2020-11-13 DIAGNOSIS — I5022 Chronic systolic (congestive) heart failure: Secondary | ICD-10-CM

## 2020-11-14 ENCOUNTER — Ambulatory Visit (INDEPENDENT_AMBULATORY_CARE_PROVIDER_SITE_OTHER): Payer: Self-pay | Admitting: Pharmacist

## 2020-11-14 ENCOUNTER — Other Ambulatory Visit: Payer: Self-pay

## 2020-11-14 VITALS — BP 140/86 | HR 65

## 2020-11-14 DIAGNOSIS — I5022 Chronic systolic (congestive) heart failure: Secondary | ICD-10-CM

## 2020-11-14 LAB — BASIC METABOLIC PANEL
BUN/Creatinine Ratio: 17 (ref 10–24)
BUN: 17 mg/dL (ref 8–27)
CO2: 26 mmol/L (ref 20–29)
Calcium: 8.9 mg/dL (ref 8.6–10.2)
Chloride: 101 mmol/L (ref 96–106)
Creatinine, Ser: 1.02 mg/dL (ref 0.76–1.27)
Glucose: 87 mg/dL (ref 65–99)
Potassium: 4.3 mmol/L (ref 3.5–5.2)
Sodium: 139 mmol/L (ref 134–144)
eGFR: 84 mL/min/{1.73_m2} (ref >59)

## 2020-11-14 NOTE — Patient Instructions (Signed)
Please bring your blood pressure cuff with you to your next visit!  I will call you tomorrow with your lab results and medication plan  Call me with any questions at [phone]

## 2020-11-14 NOTE — Progress Notes (Signed)
Patient ID: [name] [name]                 DOB: 1960/03/14                      MRN: [id number]     HPI: [name] [name] is a 60 y.o. male referred by Dr. Johney Frame to PharmD clinic. PMH is significant for CAD s/p MI, CHF s/p ICD, GERD, HLD and VT successfully treated with ATP x 1 (08/29/20). EF 25-30%. Repeat echo today.  At last visit with Dr. Johney Frame 8/10 patient's lisinopril was changed to [facility]. Patient uses the [place] and a letter was written to the [place] so they would cover medication.  At last visit with PharmD, patient had only been on Entresto for 2-3 days. He had not been checking his blood pressure. I asked him to start checking BP at home and follow up in 2 weeks.  Patient presents today to PharmD clinic. He has been checking his blood pressure at home. The highest its been he reports is 134 systolic. Typically 110-120/60's. He is taking cold medicine right now. States he has a little bit of head fog (dizziness occasionally). No swelling. SOB on occasion.  Can complete all ADL. Weights himself at home, but not everyday.  He states that he is cutting his Entresto in half. That is the way the [place] wrote the instructions. Unsure of what the strength is.  [place] ph # [phone] Says he is cutting in half  Current CHF meds: Entresto 24/26 twice a day, spironolactone 12.5mg  daily, carvedilol 25mg  twice a day Previously tried: lisinopril BP goal: <130/80  Family History:  Family History  Problem Relation Age of Onset   Hypertension Mother    Diabetes Mother    Heart Problems Father    Heart failure Unknown    Diabetes Unknown     Social History: former smoker, ETOH 2 beers 2-3 times a week, no illict drugs  Diet: started last week a partial keto diet, cutting back on potatoes, little sugar  Exercise: yard work, going to start back going for walks  Home BP readings: 110 -120 highest 134 (taking cold medicine) /68  Wt Readings from Last 3 Encounters:  10/31/20 241 lb  (109.3 kg)  10/11/20 242 lb 9.6 oz (110 kg)  09/02/19 240 lb (108.9 kg)   BP Readings from Last 3 Encounters:  10/31/20 110/72  10/11/20 132/76  09/02/19 118/68   Pulse Readings from Last 3 Encounters:  10/31/20 69  10/11/20 61  09/02/19 66    Renal function: CrCl cannot be calculated (Patient's most recent lab result is older than the maximum 21 days allowed.).  Past Medical History:  Diagnosis Date   CAD (coronary artery disease)    anterior MI 2008, treated with bare metal stent to LAD   Chronic systolic heart failure (HCC) 06/27/2010   Ischemic CM s/p ICD // Large anterior MI in 2008 tx with BMS to LAD // cMRI 6/10:  Ant, inf, septal, apical AK with full thickness scar, EF 23% // Echo 4/12: Inferoapical, apical, septal and dist ant AK, inferobasal HK, mild LVH, EF 25-30%, mild MR, mild LAE    GERD (gastroesophageal reflux disease)    HLD (hyperlipidemia)    HTN (hypertension)    Ischemic cardiomyopathy    NYHA Class II/III CHF   MI (myocardial infarction) (HCC)    Pericarditis; 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   Umbilical hernia  Ventricular tachycardia (HCC) 07/29/2018   received appropriate ATP therapy for VT at 218 bp with syncope    Current Outpatient Medications on File Prior to Visit  Medication Sig Dispense Refill   aspirin 81 MG tablet Take 81 mg by mouth once.      carvedilol (COREG) 25 MG tablet Take 1 tablet (25 mg total) by mouth 2 (two) times daily with a meal. 60 tablet 0   cholecalciferol (VITAMIN D) 1000 UNITS tablet Take 1,000 Units by mouth daily. (Patient not taking: Reported on 10/31/2020)     fish oil-omega-3 fatty acids 1000 MG capsule Take 1 g by mouth daily.      fluticasone (FLONASE) 50 MCG/ACT nasal spray Place 2 sprays into both nostrils as needed for allergies or rhinitis.     loratadine (CLARITIN) 10 MG tablet Take 10 mg by mouth daily as needed for allergies.     Multiple Vitamin (MULTIVITAMIN) tablet Take 1 tablet by mouth daily.        nitroGLYCERIN (NITROSTAT) 0.4 MG SL tablet Place 1 tablet (0.4 mg total) under the tongue every 5 (five) minutes x 3 doses as needed for chest pain. 25 tablet 1   sacubitril-valsartan (ENTRESTO) 24-26 MG Take 1 tablet by mouth 2 (two) times daily. 180 tablet 3   simvastatin (ZOCOR) 80 MG tablet TAKE 1/2 TABLET (40 MG TOTAL) BY MOUTH AT BEDTIME.     spironolactone (ALDACTONE) 25 MG tablet Take 0.5 tablets (12.5 mg total) by mouth daily. 30 tablet 0   vitamin C (ASCORBIC ACID) 500 MG tablet Take 500 mg by mouth daily.     No current facility-administered medications on file prior to visit.    No Known Allergies  There were no vitals taken for this visit.   Assessment/Plan:  1. CHF - BP is high in clinic today. He is has a head cold and is taking Alkaselter cold which has phenylephrine in it. This is probably the cause of his elevated blood pressure. Will check BMP today to see if we can increase Entresto. I will call patient tomorrow with results and he will confirm which strength of Entresto he has been taking half of. I have asked him to bring his blood pressure cuff with him to next visit. He has an apt with Tereso Newcomer next Friday. Other future medication options include increasing spironolactone to 25mg  and adding Jardiance. [facility] . [facility] @ [phone] or provide patient with a letter for the medication changes we want to make.   Thank you  [phone], Pharm.D, BCPS, CPP [facility]  [address], [place], [place]  Phone: [phone]; Fax: [phone]

## 2020-11-15 ENCOUNTER — Telehealth: Payer: Self-pay | Admitting: Pharmacist

## 2020-11-15 LAB — CUP PACEART REMOTE DEVICE CHECK
Battery Remaining Longevity: 8 mo
Battery Remaining Percentage: 6 %
Battery Voltage: 2.63 V
Brady Statistic RV Percent Paced: 1 %
Date Time Interrogation Session: 20220912041635
HighPow Impedance: 73 Ohm
HighPow Impedance: 73 Ohm
Implantable Lead Implant Date: 20120605
Implantable Lead Location: 753860
Implantable Pulse Generator Implant Date: 20120605
Lead Channel Impedance Value: 410 Ohm
Lead Channel Pacing Threshold Amplitude: 0.75 V
Lead Channel Pacing Threshold Pulse Width: 0.5 ms
Lead Channel Sensing Intrinsic Amplitude: 12 mV
Lead Channel Setting Pacing Amplitude: 2.5 V
Lead Channel Setting Pacing Pulse Width: 0.5 ms
Lead Channel Setting Sensing Sensitivity: 0.5 mV
Pulse Gen Serial Number: 809738

## 2020-11-15 NOTE — Telephone Encounter (Signed)
-----   Message from Olene Floss, RPH-CPP sent at 11/14/2020 12:55 PM EDT ----- If BMP is stable, will plan to increase Entresto to 49/51mg  Please call pt and confirm that he has the 49/51mg  at home and has been take 1/2 (he states the instructions from the [place] told him to take 1/2 BID) I can [facility] of [facility] on Thursday [facility]

## 2020-11-15 NOTE — Telephone Encounter (Signed)
BMET is stable. Called pt and left message.

## 2020-11-15 NOTE — Telephone Encounter (Signed)
Pt returned call to clinic. States he is out and hasn't looked at his med bottles. Advised him to confirm mg strength of Entresto when he gets home and call back with info.

## 2020-11-16 MED ORDER — ENTRESTO 49-51 MG PO TABS: 1.0000 | ORAL_TABLET | Freq: Two times a day (BID) | ORAL | 3 refills | Status: DC

## 2020-11-16 NOTE — Telephone Encounter (Signed)
Patient called back. States his tablets are the 49/51mg  tablets and he has been taking 1/2 tablet twice a day. [facility] [phone] and spoke with Clydie Braun who stated I needed to fax over orders plus the office notes to [phone] and she would get to the PCP to write the new rx to the pharmacy.  Called pt and let him know. Made follow up apt for 10/7. He has apt with Lorin Picket weaver next week.

## 2020-11-20 NOTE — Progress Notes (Signed)
Remote ICD transmission.   

## 2020-11-20 NOTE — Addendum Note (Signed)
Addended by: Geralyn Flash D on: 11/20/2020 05:44 PM   Modules accepted: Level of Service

## 2020-11-21 NOTE — Telephone Encounter (Signed)
Patient called and requested I send notes and Rx to [place] in [place]. I stated I would refax- he gave me same # [phone]  Advised we could give him samples if he runs out

## 2020-11-23 NOTE — Progress Notes (Signed)
Cardiology Office Note:    Date:  11/24/2020   ID:  [name] [name], DOB 05-06-1960, MRN [id number]  PCP:  [name] Chick., MD   [facility] Providers Cardiologist:  [name] Bollman, MD Electrophysiologist:  [name] Range, MD     Referring MD: [name] Chick., MD   Chief Complaint:  F/u for CHF    Patient Profile:   [name] [name] is a 60 y.o. male with:  Coronary artery disease  Anterior MI in 2008 s/p BMS to LAD (HFrEF) heart failure with reduced ejection fraction  Ischemic cardiomyopathy Echocardiogram 2012: EF 25-30 S/p AICD Ventricular tachycardia Tx with ATP in 6/22 Hypertension  Hyperlipidemia  GERD  Prior CV studies: ECHOCARDIOGRAM 10/31/20 EF 25-30, anteroseptal, apical and anterolateral AK, GR 1 DD, normal RVSF, mild RAE   - Echo (06/2010):   Inferoapical, apical, septal and dist ant AK, inferobasal HK, mild LVH, EF 25-30%, mild MR, mild LAE.   - Cardiac MRI (08/2008):   Ant, inf, septal, apical AK with full thickness scar, EF 23%   - LHC (03/2006):   prox to mid LAD occluded => 2.75 x 28 mm Vision BMS    History of Present Illness: [name] [name] was last seen by [name] [name] in 8/22.  The patient had an episode of VT treated with ATP.  His heart failure regimen was adjusted by stopping his ACE inhibitor and placing him on Entresto.  Follow-up echocardiogram demonstrated stable LV function with an EF of 25-30%.  He followed up in the pharmacy clinic for further medication titration.  He returns for general cardiology follow-up.  He is here alone.  Since last seen, he has been doing well.  He does note shortness of breath with some activities.  He sometimes has to stop to catch his breath when walking a couple of blocks.  He can climb a flight of stairs and stop at the top to catch his breath.  He has not had chest pain, syncope, orthopnea, leg edema.       Past Medical History:  Diagnosis Date   CAD (coronary artery disease)    anterior MI 2008,  treated with bare metal stent to LAD   Chronic systolic heart failure (HCC) 06/27/2010   Ischemic CM s/p ICD // Large anterior MI in 2008 tx with BMS to LAD // cMRI 6/10:  Ant, inf, septal, apical AK with full thickness scar, EF 23% // Echo 4/12: Inferoapical, apical, septal and dist ant AK, inferobasal HK, mild LVH, EF 25-30%, mild MR, mild LAE    GERD (gastroesophageal reflux disease)    HLD (hyperlipidemia)    HTN (hypertension)    Ischemic cardiomyopathy    NYHA Class II/III CHF   MI (myocardial infarction) (HCC)    Pericarditis; 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   Umbilical hernia    Ventricular tachycardia (HCC) 07/29/2018   received appropriate ATP therapy for VT at 218 bp with syncope   Current Medications: Current Meds  Medication Sig   aspirin 81 MG tablet Take 81 mg by mouth once.    carvedilol (COREG) 25 MG tablet Take 1 tablet (25 mg total) by mouth 2 (two) times daily with a meal.   cholecalciferol (VITAMIN D) 1000 UNITS tablet Take 1,000 Units by mouth daily.   empagliflozin (JARDIANCE) 10 MG TABS tablet Take 1 tablet (10 mg total) by mouth daily before breakfast.   fish oil-omega-3 fatty acids 1000 MG capsule Take 1 g by mouth daily.  fluticasone (FLONASE) 50 MCG/ACT nasal spray Place 2 sprays into both nostrils as needed for allergies or rhinitis.   loratadine (CLARITIN) 10 MG tablet Take 10 mg by mouth daily as needed for allergies.   Multiple Vitamin (MULTIVITAMIN) tablet Take 1 tablet by mouth daily.     nitroGLYCERIN (NITROSTAT) 0.4 MG SL tablet Place 1 tablet (0.4 mg total) under the tongue every 5 (five) minutes x 3 doses as needed for chest pain.   sacubitril-valsartan (ENTRESTO) 49-51 MG Take 1 tablet by mouth 2 (two) times daily.   simvastatin (ZOCOR) 80 MG tablet TAKE 1/2 TABLET (40 MG TOTAL) BY MOUTH AT BEDTIME.   spironolactone (ALDACTONE) 25 MG tablet Take 0.5 tablets (12.5 mg total) by mouth daily.   vitamin C (ASCORBIC ACID) 500 MG tablet Take 500 mg by mouth  daily.    Allergies:   Patient has no known allergies.   Social History   Tobacco Use   Smoking status: Former    Types: Cigars    Quit date: 03/04/2006    Years since quitting: 14.7   Smokeless tobacco: Never  Vaping Use   Vaping Use: Never used  Substance Use Topics   Alcohol use: Yes    Comment: occasionally   Drug use: No    Family Hx: The patient's family history includes Diabetes in his mother and unknown relative; Heart Problems in his father; Heart failure in his unknown relative; Hypertension in his mother.  Review of Systems  Gastrointestinal:  Negative for hematochezia.  Genitourinary:  Negative for hematuria.    EKGs/Labs/Other Test Reviewed:    EKG:  EKG is not ordered today.  The ekg ordered today demonstrates n/a  Recent Labs: 10/11/2020: NT-Pro BNP 82 11/14/2020: BUN 17; Creatinine, Ser 1.02; Potassium 4.3; Sodium 139   Recent Lipid Panel Lab Results  Component Value Date/Time   CHOL 121 05/21/2010 11:39 AM   TRIG 95.0 05/21/2010 11:39 AM   HDL 34.60 (L) 05/21/2010 11:39 AM   LDLCALC 67 05/21/2010 11:39 AM     Risk Assessment/Calculations:          Physical Exam:    VS:  BP 138/78   Pulse 64   Ht 5\' 10"  (1.778 m)   Wt 245 lb (111.1 kg)   SpO2 98%   BMI 35.15 kg/m     Wt Readings from Last 3 Encounters:  11/24/20 245 lb (111.1 kg)  10/31/20 241 lb (109.3 kg)  10/11/20 242 lb 9.6 oz (110 kg)    Constitutional:      Appearance: Healthy appearance. Not in distress.  Neck:     Vascular: No JVR. JVD normal.  Pulmonary:     Effort: Pulmonary effort is normal.     Breath sounds: No wheezing. No rales.  Cardiovascular:     Normal rate. Regular rhythm. Normal S1. Normal S2.      Murmurs: There is no murmur.  Edema:    Peripheral edema absent.  Abdominal:     Palpations: Abdomen is soft. There is no hepatomegaly.  Skin:    General: Skin is warm and dry.  Neurological:     General: No focal deficit present.     Mental Status: Alert and  oriented to person, place and time.     Cranial Nerves: Cranial nerves are intact.       ASSESSMENT & PLAN:   1. HFrEF (heart failure with reduced ejection fraction) (HCC) EF 25-30.  Ischemic cardiomyopathy.  NYHA II.  Volume status appears stable.  He is currently on a good regimen for CHF with a beta-blocker, ARNI, MRA.  I think he would benefit from the addition of SGLT2 inhibitor.  We discussed the rationale, risks and benefits including potential GU infections.  -Start Emplagliflozin (Jardiance) 10 mg once daily   -Continue carvedilol 25 mg twice daily, Entresto 49/51 mg twice daily, spironolactone 12.5 mg daily.  -BMET 3-4 weeks  -Follow-up 3 months  2. Coronary artery disease involving native coronary artery of native heart without angina pectoris History of anterior MI in 2008 treated with a BMS to the LAD.  He is doing well without anginal symptoms.  Continue simvastatin 40 mg daily, aspirin 81 mg daily, carvedilol 25 mg twice daily.  3. Ventricular tachycardia (HCC) 4. ICD (implantable cardioverter-defibrillator) in place He was recently seen by EP and had VT terminated by ATP.  He is doing well without syncope, near syncope or palpitations.  Continue follow-up with EP as planned.  5. Essential hypertension Blood pressure is borderline.  The addition of empagliflozin should help improve his blood pressure further.  Continue carvedilol 25 mg twice daily, Entresto 49/51 mg twice daily, spironolactone 12.5 mg daily.  6. Mixed hyperlipidemia Continue simvastatin 40 mg daily.  Obtain recent fasting lipids from primary care at the [facility].   Dispo:  Return in about 3 months (around 02/23/2021) for Routine Follow Up, w/ Dr. Excell Seltzer, or Tereso Newcomer, PA-C.   Medication Adjustments/Labs and Tests Ordered: Current medicines are reviewed at length with the patient today.  Concerns regarding medicines are outlined above.  Tests Ordered: Orders Placed This Encounter  Procedures   Basic  Metabolic Panel (BMET)    Medication Changes: Meds ordered this encounter  Medications   empagliflozin (JARDIANCE) 10 MG TABS tablet    Sig: Take 1 tablet (10 mg total) by mouth daily before breakfast.    Dispense:  90 tablet    Refill:  3    Signed, Tereso Newcomer, PA-C  11/24/2020 9:12 AM    [facility] [address] [place], [place], [place]  [address] Phone: [phone]; Fax: [phone]

## 2020-11-24 ENCOUNTER — Encounter: Payer: Self-pay | Admitting: Physician Assistant

## 2020-11-24 ENCOUNTER — Ambulatory Visit (INDEPENDENT_AMBULATORY_CARE_PROVIDER_SITE_OTHER): Payer: Self-pay | Admitting: Physician Assistant

## 2020-11-24 ENCOUNTER — Other Ambulatory Visit: Payer: Self-pay

## 2020-11-24 ENCOUNTER — Telehealth: Payer: Self-pay

## 2020-11-24 VITALS — BP 138/78 | HR 64 | Ht 70.0 in | Wt 245.0 lb

## 2020-11-24 DIAGNOSIS — I472 Ventricular tachycardia, unspecified: Secondary | ICD-10-CM

## 2020-11-24 DIAGNOSIS — Z95810 Presence of automatic (implantable) cardiac defibrillator: Secondary | ICD-10-CM

## 2020-11-24 DIAGNOSIS — I10 Essential (primary) hypertension: Secondary | ICD-10-CM

## 2020-11-24 DIAGNOSIS — E782 Mixed hyperlipidemia: Secondary | ICD-10-CM

## 2020-11-24 DIAGNOSIS — Z79899 Other long term (current) drug therapy: Secondary | ICD-10-CM

## 2020-11-24 DIAGNOSIS — I2510 Atherosclerotic heart disease of native coronary artery without angina pectoris: Secondary | ICD-10-CM

## 2020-11-24 DIAGNOSIS — I5020 Unspecified systolic (congestive) heart failure: Secondary | ICD-10-CM

## 2020-11-24 MED ORDER — EMPAGLIFLOZIN 10 MG PO TABS: 10.0000 mg | ORAL_TABLET | Freq: Every day | ORAL | 3 refills | Status: DC

## 2020-11-24 NOTE — Patient Instructions (Signed)
Medication Instructions:  Start Jardiance 10 mg daily  *If you need a refill on your cardiac medications before your next appointment, please call your pharmacy*   Lab Work: BMET in  4 weeks  If you have labs (blood work) drawn today and your tests are completely normal, you will receive your results only by: MyChart Message (if you have MyChart) OR A paper copy in the mail If you have any lab test that is abnormal or we need to change your treatment, we will call you to review the results.   Testing/Procedures: none   Follow-Up: At [facility], you and your health needs are our priority.  As part of our continuing mission to provide you with exceptional heart care, we have created designated Provider Care Teams.  These Care Teams include your primary Cardiologist (physician) and Advanced Practice Providers (APPs -  Physician Assistants and Nurse Practitioners) who all work together to provide you with the care you need, when you need it.  We recommend signing up for the patient portal called "MyChart".  Sign up information is provided on this After Visit Summary.  MyChart is used to connect with patients for Virtual Visits (Telemedicine).  Patients are able to view lab/test results, encounter notes, upcoming appointments, etc.  Non-urgent messages can be sent to your provider as well.   To learn more about what you can do with MyChart, go to [email or web address].    Your next appointment:   3 month(s)  The format for your next appointment:   In Person  Provider:   You may see Tonny Bollman, MD or one of the following Advanced Practice Providers on your designated Care Team:   Tereso Newcomer, PA-C [place] Aus, [place]   Other Instructions

## 2020-11-24 NOTE — Telephone Encounter (Signed)
Pt given #28 samples of Entresto 49/51. He will follow up with the [place] in [place]. RX information has been sent 11/16/20 and 11/2920.

## 2020-12-07 NOTE — Progress Notes (Addendum)
Patient ID: [name] [name]                 DOB: 21-May-1960                      MRN: [id number]     HPI: [name] [name] is a 60 y.o. male referred by Dr. Johney Frame to PharmD clinic. PMH is significant for CAD s/p MI in 2008, CHF s/p ICD, GERD, HLD, HTN, and VT successfully treated with ATP x 1 (08/29/20). Most recent EF 25-30% 10/31/20.  Patient was last seen by PharmD on 11/14/20 and Entresto was increased to 49-51mg  BID. Then seen by Tereso Newcomer on 11/24/20 where he was started on Jardiance 10 mg daily.  Today he returns to pharmacy clinic for further medication titration. He is taking all his medications as prescribed and tolerating them well. As of today, he is able to secure all generic and brands from the [place] in [place]. Symptomatically, he is feeling some dyspnea with activity but notes this is similar to his baseline. He denies dizziness, lightheadedness, or swelling. He checks his BP at home and usually runs 120-130 systolic. He has brought his cuff in to assess accuracy. Home cuff reading - 110/80, compared to clinic reading - 126/80.  Current CHF meds: Entresto 49/51 BID, spironolactone 12.5mg  daily, carvedilol 25mg  BID, Jardiance 10 mg daily  Previously tried: lisinopril BP goal: <130/80  Family History:       Family History  Problem Relation Age of Onset   Hypertension Mother     Diabetes Mother     Heart Problems Father     Heart failure Unknown     Diabetes Unknown     Social History: former smoker, ETOH 2 beers 2-3 times a week, no illict drugs   Diet: cutting back on potatoes/starches, little sugar, eating lean meats   Exercise: yard work, going to start back going for walks  Home BP readings: Home cuff -110/80 Clinic cuff - 126/80  Wt Readings from Last 3 Encounters:  12/08/20 244 lb (110.7 kg)  11/24/20 245 lb (111.1 kg)  10/31/20 241 lb (109.3 kg)   BP Readings from Last 3 Encounters:  12/08/20 126/80  11/24/20 138/78  11/14/20 140/86   Pulse Readings  from Last 3 Encounters:  12/08/20 68  11/24/20 64  11/14/20 65    Renal function: CrCl cannot be calculated (Patient's most recent lab result is older than the maximum 21 days allowed.).  Past Medical History:  Diagnosis Date   CAD (coronary artery disease)    anterior MI 2008, treated with bare metal stent to LAD   Chronic systolic heart failure (HCC) 06/27/2010   Ischemic CM s/p ICD // Large anterior MI in 2008 tx with BMS to LAD // cMRI 6/10:  Ant, inf, septal, apical AK with full thickness scar, EF 23% // Echo 4/12: Inferoapical, apical, septal and dist ant AK, inferobasal HK, mild LVH, EF 25-30%, mild MR, mild LAE    GERD (gastroesophageal reflux disease)    HLD (hyperlipidemia)    HTN (hypertension)    Ischemic cardiomyopathy    NYHA Class II/III CHF   MI (myocardial infarction) (HCC)    Pericarditis; 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   Umbilical hernia    Ventricular tachycardia (HCC) 07/29/2018   received appropriate ATP therapy for VT at 218 bp with syncope    Current Outpatient Medications on File Prior to Visit  Medication Sig Dispense Refill   aspirin  81 MG tablet Take 81 mg by mouth once.      carvedilol (COREG) 25 MG tablet Take 1 tablet (25 mg total) by mouth 2 (two) times daily with a meal. 60 tablet 0   cholecalciferol (VITAMIN D) 1000 UNITS tablet Take 1,000 Units by mouth daily.     empagliflozin (JARDIANCE) 10 MG TABS tablet Take 1 tablet (10 mg total) by mouth daily before breakfast. 90 tablet 3   fish oil-omega-3 fatty acids 1000 MG capsule Take 1 g by mouth daily.      fluticasone (FLONASE) 50 MCG/ACT nasal spray Place 2 sprays into both nostrils as needed for allergies or rhinitis.     loratadine (CLARITIN) 10 MG tablet Take 10 mg by mouth daily as needed for allergies.     Multiple Vitamin (MULTIVITAMIN) tablet Take 1 tablet by mouth daily.       nitroGLYCERIN (NITROSTAT) 0.4 MG SL tablet Place 1 tablet (0.4 mg total) under the tongue every 5 (five) minutes x 3  doses as needed for chest pain. 25 tablet 1   sacubitril-valsartan (ENTRESTO) 49-51 MG Take 1 tablet by mouth 2 (two) times daily. 180 tablet 3   simvastatin (ZOCOR) 80 MG tablet TAKE 1/2 TABLET (40 MG TOTAL) BY MOUTH AT BEDTIME.     vitamin C (ASCORBIC ACID) 500 MG tablet Take 500 mg by mouth daily.     No current facility-administered medications on file prior to visit.    No Known Allergies   Assessment/Plan:  1. CHF -  Patient BP is at goal <130/80, but not yet at target doses of GDMT for CHF. Patient's home and office BP are within goal, although home cuff measured ~68mmHg lower systolic today. Checked BMET today given recent dose increase of Entresto. Labs stable, will increase spironolactone 25 mg to 1 full tablet daily. Continue Jardiance 10 mg daily, Entresto 49-51 mg BID, and carvedilol 25 mg BID. At next visit, consider increasing Entresto to 97-103 mg BID to reach target CHF dose at a future visit if BP can tolerate and renal function and potassium are stable. Encouraged patient to continue eating lean meats and cutting back on starches as well as regular exercise and walking at least 150 minutes/week. F/u in 2 weeks for BP check and BMET.  Chart note faxed to [facility], pt provided with hard copy of spironolactone rx to bring to [facility] as well.  Pt seen with Hermenia Fiscal  Pharm D. Candidate  [facility] [address]  Halsey E. Supple, PharmD, BCACP, CPP [facility] [address], [place], [place] [address] Phone: [phone]; Fax: [phone] 12/08/2020 12:59 PM

## 2020-12-08 ENCOUNTER — Other Ambulatory Visit: Payer: Self-pay

## 2020-12-08 ENCOUNTER — Ambulatory Visit (INDEPENDENT_AMBULATORY_CARE_PROVIDER_SITE_OTHER): Payer: Self-pay | Admitting: Pharmacist

## 2020-12-08 VITALS — BP 126/80 | HR 68 | Wt 244.0 lb

## 2020-12-08 DIAGNOSIS — I5022 Chronic systolic (congestive) heart failure: Secondary | ICD-10-CM

## 2020-12-08 DIAGNOSIS — I10 Essential (primary) hypertension: Secondary | ICD-10-CM

## 2020-12-08 DIAGNOSIS — I5020 Unspecified systolic (congestive) heart failure: Secondary | ICD-10-CM

## 2020-12-08 LAB — BASIC METABOLIC PANEL
BUN/Creatinine Ratio: 18 (ref 10–24)
BUN: 18 mg/dL (ref 8–27)
CO2: 23 mmol/L (ref 20–29)
Calcium: 9.3 mg/dL (ref 8.6–10.2)
Chloride: 100 mmol/L (ref 96–106)
Creatinine, Ser: 1.01 mg/dL (ref 0.76–1.27)
Glucose: 91 mg/dL (ref 70–99)
Potassium: 4.6 mmol/L (ref 3.5–5.2)
Sodium: 138 mmol/L (ref 134–144)
eGFR: 85 mL/min/{1.73_m2} (ref >59)

## 2020-12-08 MED ORDER — SPIRONOLACTONE 25 MG PO TABS: 25.0000 mg | ORAL_TABLET | Freq: Every day | ORAL | 3 refills | Status: DC

## 2020-12-08 NOTE — Patient Instructions (Addendum)
It was nice to see you today  Your blood pressure goal is < 130/52mmHg  We will check your labs today and if they're stable, will plan to increase your spironolactone to 25mg  (1 full tablet) daily  Continue taking your other medications  Follow up in 2 weeks for lab work and blood pressure check - Friday October 21 at 8:30am

## 2020-12-14 ENCOUNTER — Ambulatory Visit (INDEPENDENT_AMBULATORY_CARE_PROVIDER_SITE_OTHER): Payer: Self-pay

## 2020-12-14 DIAGNOSIS — I5022 Chronic systolic (congestive) heart failure: Secondary | ICD-10-CM

## 2020-12-14 LAB — CUP PACEART REMOTE DEVICE CHECK
Battery Remaining Longevity: 6 mo
Battery Remaining Percentage: 4 %
Battery Voltage: 2.62 V
Brady Statistic RV Percent Paced: 1 %
Date Time Interrogation Session: 20221013022615
HighPow Impedance: 83 Ohm
HighPow Impedance: 83 Ohm
Implantable Lead Implant Date: 20120605
Implantable Lead Location: 753860
Implantable Pulse Generator Implant Date: 20120605
Lead Channel Impedance Value: 410 Ohm
Lead Channel Pacing Threshold Amplitude: 0.75 V
Lead Channel Pacing Threshold Pulse Width: 0.5 ms
Lead Channel Sensing Intrinsic Amplitude: 12 mV
Lead Channel Setting Pacing Amplitude: 2.5 V
Lead Channel Setting Pacing Pulse Width: 0.5 ms
Lead Channel Setting Sensing Sensitivity: 0.5 mV
Pulse Gen Serial Number: 809738

## 2020-12-21 NOTE — Progress Notes (Signed)
Patient ID: [name] [name]                 DOB: 08/26/1960                      MRN: [id number]     HPI: [name] [name] is a 60 y.o. male referred by Dr. Johney Frame to PharmD clinic. PMH is significant for CAD s/p MI in 2008, CHF s/p ICD, GERD, HLD, HTN, and VT successfully treated with ATP x 1 (08/29/20). Most recent EF 25-30% 10/31/20.  Patient was last seen by Pharm D on 10/7. At that visit, spironolactone was increased to 25 mg as labs were stable.(10/7 Scr 1.01 and K 4.6 on Entresto 49-51 mg and spiro 12.5 mg). Patient also brought in his home cuff which was reading about SBP 10 mm Hg less than in clinic.   Today, patient is seen in clinic for follow-up. He reports he is taking all his medications as prescribed, but he states he needs documentation to continue to get his meds from the [place], and he is low on Jardiance. Symptomatically, he reports no acute SOB and he is able to complete his ADLs without difficulty. He denies LEE, PND, or orthopnea. He also denies dizziness, headaches, or blurred vision. At home, his weight has been stable (241-243 lbs ) and his home BP is around 115-120/78-80 in the morning before taking medications. He still uses his previous cuff. Today's BP in office is 122/78.    Current CHF meds: Entresto 49/51 BID, spironolactone 25 mg daily, carvedilol 25mg  BID, Jardiance 10 mg daily  Previously tried: lisinopril BP goal: <130/80  Family History: family history includes Diabetes in his mother and unknown relative; Heart Problems in his father; Heart failure in his unknown relative; Hypertension in his mother.   Social History:  reports that he quit smoking about 14 years ago. His smoking use included cigars. He has never used smokeless tobacco. He reports current alcohol use. He reports that he does not use drugs. Drinks 2 beers 2-3 times a week  Diet: cutting back on potatoes/starches, little sugar, eating lean meats Drink coffee, adheres to a low sodium diet    Exercise: yard work, going to start back going for walks 2x a week  Home BP readings: 115-120/78-80 (home BP runs 10 points low)  Wt Readings from Last 3 Encounters:  12/22/20 243 lb 6.4 oz (110.4 kg)  12/08/20 244 lb (110.7 kg)  11/24/20 245 lb (111.1 kg)   BP Readings from Last 3 Encounters:  12/22/20 122/78  12/08/20 126/80  11/24/20 138/78   Pulse Readings from Last 3 Encounters:  12/22/20 76  12/08/20 68  11/24/20 64    Renal function: Estimated Creatinine Clearance: 96.8 mL/min (by C-G formula based on SCr of 1.01 mg/dL).  Past Medical History:  Diagnosis Date   CAD (coronary artery disease)    anterior MI 2008, treated with bare metal stent to LAD   Chronic systolic heart failure (HCC) 06/27/2010   Ischemic CM s/p ICD // Large anterior MI in 2008 tx with BMS to LAD // cMRI 6/10:  Ant, inf, septal, apical AK with full thickness scar, EF 23% // Echo 4/12: Inferoapical, apical, septal and dist ant AK, inferobasal HK, mild LVH, EF 25-30%, mild MR, mild LAE    GERD (gastroesophageal reflux disease)    HLD (hyperlipidemia)    HTN (hypertension)    Ischemic cardiomyopathy    NYHA Class II/III CHF   MI (  myocardial infarction) (HCC)    Pericarditis; 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   Umbilical hernia    Ventricular tachycardia (HCC) 07/29/2018   received appropriate ATP therapy for VT at 218 bp with syncope    Current Outpatient Medications on File Prior to Visit  Medication Sig Dispense Refill   aspirin 81 MG tablet Take 81 mg by mouth once.      carvedilol (COREG) 25 MG tablet Take 1 tablet (25 mg total) by mouth 2 (two) times daily with a meal. 60 tablet 0   cholecalciferol (VITAMIN D) 1000 UNITS tablet Take 1,000 Units by mouth daily.     empagliflozin (JARDIANCE) 10 MG TABS tablet Take 1 tablet (10 mg total) by mouth daily before breakfast. 90 tablet 3   fish oil-omega-3 fatty acids 1000 MG capsule Take 1 g by mouth daily.      fluticasone (FLONASE) 50 MCG/ACT nasal  spray Place 2 sprays into both nostrils as needed for allergies or rhinitis.     loratadine (CLARITIN) 10 MG tablet Take 10 mg by mouth daily as needed for allergies.     Multiple Vitamin (MULTIVITAMIN) tablet Take 1 tablet by mouth daily.       nitroGLYCERIN (NITROSTAT) 0.4 MG SL tablet Place 1 tablet (0.4 mg total) under the tongue every 5 (five) minutes x 3 doses as needed for chest pain. 25 tablet 1   sacubitril-valsartan (ENTRESTO) 49-51 MG Take 1 tablet by mouth 2 (two) times daily. 180 tablet 3   simvastatin (ZOCOR) 80 MG tablet TAKE 1/2 TABLET (40 MG TOTAL) BY MOUTH AT BEDTIME.     spironolactone (ALDACTONE) 25 MG tablet Take 1 tablet (25 mg total) by mouth daily. 90 tablet 3   vitamin C (ASCORBIC ACID) 500 MG tablet Take 500 mg by mouth daily.     No current facility-administered medications on file prior to visit.    No Known Allergies   Assessment/Plan:  1. CHF - Pts BP in clinic is at goal of <130/80 mm Hg, and he is at target dose of his HF meds with the exception of Entresto. Continue spironolactone 25 mg daily, carvedilol 25 mg BID, and Jardiance 10 mg daily. Will increase Entresto to 97-103 mg BID if today's Bmet allows after recent spironolactone increase.   He has spironolactone and Entresto at home, but is low on Jardiance so gave pt 2-week samples of Jardiance 10 mg and will submit letter to the [facility] (fax #[phone]) to cover Jardiance 10 mg, Entresto 97-103 mg, and spironolactone 25 mg.   Counseled patient to continue low-sodium diet, and to continue regular walks aiming for a goal of 150 minutes / week of physical activity. Encouraged patient to continue checking BP at home.   Follow up in 3 weeks to assess BP with Bmet pending Entresto increase.  Hermenia Fiscal  Pharm D. Candidate  [facility] [address] [place], [place].D, BCPS, CPP [facility]  [address] [place], [place] [address]  Phone: [phone]; Fax: [phone]

## 2020-12-22 ENCOUNTER — Ambulatory Visit (INDEPENDENT_AMBULATORY_CARE_PROVIDER_SITE_OTHER): Payer: Self-pay | Admitting: Pharmacist

## 2020-12-22 ENCOUNTER — Other Ambulatory Visit: Payer: Self-pay

## 2020-12-22 ENCOUNTER — Other Ambulatory Visit: Payer: Self-pay | Admitting: *Deleted

## 2020-12-22 VITALS — BP 122/78 | HR 76 | Wt 243.4 lb

## 2020-12-22 DIAGNOSIS — I10 Essential (primary) hypertension: Secondary | ICD-10-CM

## 2020-12-22 DIAGNOSIS — I5022 Chronic systolic (congestive) heart failure: Secondary | ICD-10-CM

## 2020-12-22 DIAGNOSIS — Z79899 Other long term (current) drug therapy: Secondary | ICD-10-CM

## 2020-12-22 DIAGNOSIS — I5020 Unspecified systolic (congestive) heart failure: Secondary | ICD-10-CM

## 2020-12-22 LAB — BASIC METABOLIC PANEL
BUN/Creatinine Ratio: 13 (ref 10–24)
BUN: 16 mg/dL (ref 8–27)
CO2: 25 mmol/L (ref 20–29)
Calcium: 9 mg/dL (ref 8.6–10.2)
Chloride: 101 mmol/L (ref 96–106)
Creatinine, Ser: 1.22 mg/dL (ref 0.76–1.27)
Glucose: 94 mg/dL (ref 70–99)
Potassium: 4 mmol/L (ref 3.5–5.2)
Sodium: 140 mmol/L (ref 134–144)
eGFR: 68 mL/min/{1.73_m2} (ref >59)

## 2020-12-22 NOTE — Patient Instructions (Addendum)
It was nice to see you today. Your BP is at goal of <130/80.    CONTINUE spironolactone 25 mg daily, carvedilol 25mg  twice a day, and Jardiance 10 mg daily.   We will call you with your lab results. If call looks good we will plan to increase your Entresto to 97-103 mg twice a day.   [facility]ardiance and  Continue monitoring your blood pressure at home and bring in your log to your next visit.  Aim to consume less than 2 grams of sodium per day. Aim to get at least 150 minutes total per week of aerobic activity (e.g. brisk walk).    Follow up in 2 weeks to assess blood pressure and get some labwork.

## 2020-12-22 NOTE — Progress Notes (Signed)
Remote ICD transmission.   

## 2020-12-25 ENCOUNTER — Other Ambulatory Visit: Payer: Self-pay | Admitting: Pharmacist

## 2020-12-25 ENCOUNTER — Telehealth: Payer: Self-pay

## 2020-12-25 DIAGNOSIS — I5022 Chronic systolic (congestive) heart failure: Secondary | ICD-10-CM

## 2020-12-25 MED ORDER — EMPAGLIFLOZIN 10 MG PO TABS: 10.0000 mg | ORAL_TABLET | Freq: Every day | ORAL | 3 refills | Status: DC

## 2020-12-25 MED ORDER — ENTRESTO 97-103 MG PO TABS: 1.0000 | ORAL_TABLET | Freq: Two times a day (BID) | ORAL | 3 refills | Status: DC

## 2020-12-25 MED ORDER — SPIRONOLACTONE 25 MG PO TABS: 25.0000 mg | ORAL_TABLET | Freq: Every day | ORAL | 3 refills | Status: DC

## 2020-12-25 NOTE — Telephone Encounter (Signed)
Called pt to inform we will increase Entresto to 97-103 mg BID and will send rx to [facility]. Patient expressed understanding.

## 2021-01-12 ENCOUNTER — Ambulatory Visit (INDEPENDENT_AMBULATORY_CARE_PROVIDER_SITE_OTHER): Payer: Self-pay | Admitting: Pharmacist

## 2021-01-12 ENCOUNTER — Other Ambulatory Visit: Payer: Self-pay

## 2021-01-12 ENCOUNTER — Encounter: Payer: Self-pay | Admitting: Pharmacist

## 2021-01-12 VITALS — BP 120/80 | HR 72 | Wt 241.4 lb

## 2021-01-12 DIAGNOSIS — F330 Major depressive disorder, recurrent, mild: Secondary | ICD-10-CM | POA: Insufficient documentation

## 2021-01-12 DIAGNOSIS — I5022 Chronic systolic (congestive) heart failure: Secondary | ICD-10-CM

## 2021-01-12 DIAGNOSIS — I5020 Unspecified systolic (congestive) heart failure: Secondary | ICD-10-CM

## 2021-01-12 DIAGNOSIS — G4733 Obstructive sleep apnea (adult) (pediatric): Secondary | ICD-10-CM | POA: Insufficient documentation

## 2021-01-12 DIAGNOSIS — H02883 Meibomian gland dysfunction of right eye, unspecified eyelid: Secondary | ICD-10-CM | POA: Insufficient documentation

## 2021-01-12 MED ORDER — SPIRONOLACTONE 25 MG PO TABS: 25.0000 mg | ORAL_TABLET | Freq: Every day | ORAL | 3 refills | Status: AC

## 2021-01-12 NOTE — Patient Instructions (Signed)
It was nice meeting you today  We would like to keep your blood pressure less than 130/80  Continue your: Entresto 97/103mg  twice a day Carvedilol 25mg  twice a day Jardiance 25mg  (1/2 tablet) once a day Spironolactone 25mg  once a day  I will give you a hard copy of your spironolactone prescription  Continue to monitor your weight and blood pressure at home  Call if you gain more than 3 pounds overnight or 5 pounds in a week  Let know if you have any questions  HAPPY VETERAN'S DAY!  , PharmD, BCACP, CDCES, CPP [facility] [address], [place], [facility] [address] Phone: [phone]; Fax: [phone] 01/12/2021 8:31 AM

## 2021-01-12 NOTE — Progress Notes (Signed)
Patient ID: [name]                 DOB: 09-Oct-1960                      MRN: [id number]     HPI: [name] [name] is a 60 y.o. male referred by Dr. Johney [name] to HTN clinic. PMH is significant for CAD s/p MI in 2008, CHF s/p ICD, GERD, HLD, HTN, and VT successfully treated with ATP x 1 (08/29/20). Most recent EF 25-30% 10/31/20.   At first visit with Pharm D on 10/7 spironolactone was increased to 25 mg as labs were stable.(10/7 Scr 1.01 and K 4.6 on Entresto 49-51 mg and spiro 12.5 mg). Patient also brought in his home cuff which was reading about SBP 10 mm Hg less than in clinic.  At second visit with PharmD, [name] [name] was increased to 97-103 BID and lab work was stable.  Patient was given  Jardiance 10mg  samples.  Today he returns to pharmacy clinic for follow up. Symptomatically, he is feeling well, denies dizziness, lightheadedness, and fatigue. Denies chest pain or palpitations. Reports he does feel any more SOB than usual.  Trying to los weight. Denies LEE, PND, or orthopnea. No issues with appetitie.    Has been weighing himself at home and has noticed no significant weight gain.  Has been occasionally checking BP at home.  Did not bring meter or log but thinks it is usually in 120s/70s to 80s.    Patient brought in [place] from [place].  Had been prescribed for 10mg  once daily.  However [facility]  [facility]/2 [facility] once a day.  Patient does not know if [facility] has [facility] for spironolactone 25mg  and reports he has been running low.  Current CHF meds:   Entresto 97-103 BID Carvedilol [place] BID Spironolactone 25mg  daily Jardiance 12.5mg  once a day  BP goal: <130/80  Wt Readings from Last 3 Encounters:  12/22/20 243 lb 6.4 oz (110.4 kg)  12/08/20 244 lb (110.7 kg)  11/24/20 245 lb (111.1 kg)   BP Readings from Last 3 Encounters:  12/22/20 122/78  12/08/20 126/80  11/24/20 138/78   Pulse Readings from Last 3 Encounters:  12/22/20 76  12/08/20 68  11/24/20 64     Renal function: CrCl cannot be calculated (Patient's most recent lab result is older than the maximum 21 days allowed.).  Past Medical History:  Diagnosis Date   CAD (coronary artery disease)    anterior MI 2008, treated with bare metal stent to LAD   Chronic systolic heart failure (HCC) 06/27/2010   Ischemic CM s/p ICD // Large anterior MI in 2008 tx with BMS to LAD // cMRI 6/10:  Ant, inf, septal, apical AK with full thickness scar, EF 23% // Echo 4/12: Inferoapical, apical, septal and dist ant AK, inferobasal HK, mild LVH, EF 25-30%, mild MR, mild LAE    GERD (gastroesophageal reflux disease)    HLD (hyperlipidemia)    HTN (hypertension)    Ischemic cardiomyopathy    NYHA Class II/III CHF   MI (myocardial infarction) (HCC)    Pericarditis; 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   Umbilical hernia    Ventricular tachycardia (HCC) 07/29/2018   received appropriate ATP therapy for VT at 218 bp with syncope    Current Outpatient Medications on File Prior to Visit  Medication Sig Dispense Refill   ARIPiprazole (ABILIFY) 10 MG tablet Take 1 tablet by mouth daily.  folic acid (FOLVITE) 1 MG tablet TAKE 2 TABLETS BY MOUTH EVERY DAY ###     PARoxetine (PAXIL) 40 MG tablet Take 1 tablet by mouth at bedtime.     aspirin 81 MG tablet Take 81 mg by mouth once.      carvedilol (COREG) 25 MG tablet Take 1 tablet (25 mg total) by mouth 2 (two) times daily with a meal. 60 tablet 0   cholecalciferol (VITAMIN D) 1000 UNITS tablet Take 1,000 Units by mouth daily.     empagliflozin (JARDIANCE) 10 MG TABS tablet Take 1 tablet (10 mg total) by mouth daily before breakfast. 90 tablet 3   fish oil-omega-3 fatty acids 1000 MG capsule Take 1 g by mouth daily.      fluticasone (FLONASE) 50 MCG/ACT nasal spray Place 2 sprays into both nostrils as needed for allergies or rhinitis.     loratadine (CLARITIN) 10 MG tablet Take 10 mg by mouth daily as needed for allergies.     Multiple Vitamin (MULTIVITAMIN) tablet  Take 1 tablet by mouth daily.       nitroGLYCERIN (NITROSTAT) 0.4 MG SL tablet Place 1 tablet (0.4 mg total) under the tongue every 5 (five) minutes x 3 doses as needed for chest pain. 25 tablet 1   sacubitril-valsartan (ENTRESTO) 97-103 MG Take 1 tablet by mouth 2 (two) times daily. 180 tablet 3   simvastatin (ZOCOR) 80 MG tablet TAKE 1/2 TABLET (40 MG TOTAL) BY MOUTH AT BEDTIME.     spironolactone (ALDACTONE) 25 MG tablet Take 1 tablet (25 mg total) by mouth daily. 90 tablet 3   vitamin C (ASCORBIC ACID) 500 MG tablet Take 500 mg by mouth daily.     No current facility-administered medications on file prior to visit.    No Known Allergies   Assessment/Plan:  1. CHF -  Patient BP in room 120/80 which is at goal of <130/80.  Patient tolerating all medications well.  Advised that taking Jardiance 12.5mg  once daily will be fine.  Recommended patient continue to check BP at home and to monitor weight at home. Advised to call clinic if he gains more than 3# overnight or 5# in a week.  Will print spironolactone prescription and have Dr [name] [name] sign since patient has appt at [facility] at the end of the month.  No med changes needed at this time  Continue:  Entresto 97/103mg  twice a day Carvedilol 25mg  twice a day Jardiance 25mg  (1/2 tablet) once a day Spironolactone 25mg  once a day Recheck as needed  , PharmD, BCACP, CDCES, CPP [facility] [address], [place], [place] [address] Phone: [phone]; Fax: [phone] 01/12/2021 9:06 AM

## 2021-01-15 ENCOUNTER — Ambulatory Visit: Payer: Self-pay

## 2021-01-15 DIAGNOSIS — I5022 Chronic systolic (congestive) heart failure: Secondary | ICD-10-CM

## 2021-01-16 LAB — CUP PACEART REMOTE DEVICE CHECK
Battery Remaining Longevity: 4 mo
Battery Remaining Percentage: 3 %
Battery Voltage: 2.62 V
Brady Statistic RV Percent Paced: 1 %
Date Time Interrogation Session: 20221113053603
HighPow Impedance: 78 Ohm
HighPow Impedance: 78 Ohm
Implantable Lead Implant Date: 20120605
Implantable Lead Location: 753860
Implantable Pulse Generator Implant Date: 20120605
Lead Channel Impedance Value: 390 Ohm
Lead Channel Pacing Threshold Amplitude: 0.75 V
Lead Channel Pacing Threshold Pulse Width: 0.5 ms
Lead Channel Sensing Intrinsic Amplitude: 12 mV
Lead Channel Setting Pacing Amplitude: 2.5 V
Lead Channel Setting Pacing Pulse Width: 0.5 ms
Lead Channel Setting Sensing Sensitivity: 0.5 mV
Pulse Gen Serial Number: 809738

## 2021-01-22 NOTE — Progress Notes (Signed)
Remote ICD transmission.   

## 2021-02-14 LAB — CUP PACEART REMOTE DEVICE CHECK
Battery Remaining Longevity: 4 mo
Battery Remaining Percentage: 3 %
Battery Voltage: 2.6 V
Brady Statistic RV Percent Paced: 1 %
Date Time Interrogation Session: 20221214034535
HighPow Impedance: 75 Ohm
HighPow Impedance: 75 Ohm
Implantable Lead Implant Date: 20120605
Implantable Lead Location: 753860
Implantable Pulse Generator Implant Date: 20120605
Lead Channel Impedance Value: 450 Ohm
Lead Channel Pacing Threshold Amplitude: 0.75 V
Lead Channel Pacing Threshold Pulse Width: 0.5 ms
Lead Channel Sensing Intrinsic Amplitude: 12 mV
Lead Channel Setting Pacing Amplitude: 2.5 V
Lead Channel Setting Pacing Pulse Width: 0.5 ms
Lead Channel Setting Sensing Sensitivity: 0.5 mV
Pulse Gen Serial Number: 809738

## 2021-02-15 ENCOUNTER — Ambulatory Visit (INDEPENDENT_AMBULATORY_CARE_PROVIDER_SITE_OTHER): Payer: Self-pay

## 2021-02-15 DIAGNOSIS — I255 Ischemic cardiomyopathy: Secondary | ICD-10-CM

## 2021-02-27 NOTE — Progress Notes (Signed)
Remote ICD transmission.   

## 2021-03-19 ENCOUNTER — Ambulatory Visit (INDEPENDENT_AMBULATORY_CARE_PROVIDER_SITE_OTHER): Payer: Self-pay

## 2021-03-19 DIAGNOSIS — I255 Ischemic cardiomyopathy: Secondary | ICD-10-CM

## 2021-03-20 LAB — CUP PACEART REMOTE DEVICE CHECK
Battery Remaining Longevity: 4 mo
Battery Remaining Percentage: 3 %
Battery Voltage: 2.6 V
Brady Statistic RV Percent Paced: 1 %
Date Time Interrogation Session: 20230116055601
HighPow Impedance: 75 Ohm
HighPow Impedance: 75 Ohm
Implantable Lead Implant Date: 20120605
Implantable Lead Location: 753860
Implantable Pulse Generator Implant Date: 20120605
Lead Channel Impedance Value: 480 Ohm
Lead Channel Pacing Threshold Amplitude: 0.75 V
Lead Channel Pacing Threshold Pulse Width: 0.5 ms
Lead Channel Sensing Intrinsic Amplitude: 12 mV
Lead Channel Setting Pacing Amplitude: 2.5 V
Lead Channel Setting Pacing Pulse Width: 0.5 ms
Lead Channel Setting Sensing Sensitivity: 0.5 mV
Pulse Gen Serial Number: 809738

## 2021-03-21 ENCOUNTER — Ambulatory Visit (INDEPENDENT_AMBULATORY_CARE_PROVIDER_SITE_OTHER): Payer: Self-pay | Admitting: Physician Assistant

## 2021-03-21 ENCOUNTER — Encounter: Payer: Self-pay | Admitting: Physician Assistant

## 2021-03-21 ENCOUNTER — Other Ambulatory Visit: Payer: Self-pay

## 2021-03-21 VITALS — BP 100/60 | HR 81 | Ht 70.0 in | Wt 240.2 lb

## 2021-03-21 DIAGNOSIS — I10 Essential (primary) hypertension: Secondary | ICD-10-CM

## 2021-03-21 DIAGNOSIS — I2510 Atherosclerotic heart disease of native coronary artery without angina pectoris: Secondary | ICD-10-CM

## 2021-03-21 DIAGNOSIS — E782 Mixed hyperlipidemia: Secondary | ICD-10-CM

## 2021-03-21 DIAGNOSIS — I5020 Unspecified systolic (congestive) heart failure: Secondary | ICD-10-CM

## 2021-03-21 DIAGNOSIS — Z01810 Encounter for preprocedural cardiovascular examination: Secondary | ICD-10-CM | POA: Insufficient documentation

## 2021-03-21 DIAGNOSIS — I472 Ventricular tachycardia, unspecified: Secondary | ICD-10-CM | POA: Insufficient documentation

## 2021-03-21 DIAGNOSIS — I4729 Other ventricular tachycardia: Secondary | ICD-10-CM | POA: Insufficient documentation

## 2021-03-21 NOTE — Assessment & Plan Note (Signed)
Continue simvastatin 40 mg daily.  I will request his most recent lipid panel from the [facility]

## 2021-03-21 NOTE — Assessment & Plan Note (Signed)
Blood pressures well controlled on current therapy.

## 2021-03-21 NOTE — Assessment & Plan Note (Addendum)
[name] [name] is to have umbilical herniorrhaphy soon.  HIs perioperative risk of a major cardiac event is 11% according to the Revised Cardiac Risk Index (RCRI).  Therefore, he is at high risk for perioperative complications.   His functional capacity is good at 4.74 METs according to the Duke Activity Status Index (DASI). Recommendations: According to ACC/AHA guidelines, no further cardiovascular testing needed.  The patient may proceed to surgery at acceptable risk.   Antiplatelet and/or Anticoagulation Recommendations: Aspirin should be continued without interruption.  However, if the bleeding risk is too great, ASA can be held for 5-7 days prior to his surgery.  Please resume Aspirin post operatively when it is felt to be safe from a bleeding standpoint.

## 2021-03-21 NOTE — Patient Instructions (Signed)
Medication Instructions:   Your physician recommends that you continue on your current medications as directed. Please refer to the Current Medication list given to you today.  *If you need a refill on your cardiac medications before your next appointment, please call your pharmacy*   Lab Work:  TODAY !!!!!! BMET   If you have labs (blood work) drawn today and your tests are completely normal, you will receive your results only by: MyChart Message (if you have MyChart) OR A paper copy in the mail If you have any lab test that is abnormal or we need to change your treatment, we will call you to review the results.   Testing/Procedures:   None ordered.   Follow-Up: At [facility], you and your health needs are our priority.  As part of our continuing mission to provide you with exceptional heart care, we have created designated Provider Care Teams.  These Care Teams include your primary Cardiologist (physician) and Advanced Practice Providers (APPs -  Physician Assistants and Nurse Practitioners) who all work together to provide you with the care you need, when you need it.  We recommend signing up for the patient portal called "MyChart".  Sign up information is provided on this After Visit Summary.  MyChart is used to connect with patients for Virtual Visits (Telemedicine).  Patients are able to view lab/test results, encounter notes, upcoming appointments, etc.  Non-urgent messages can be sent to your provider as well.   To learn more about what you can do with MyChart, go to [email or web address].    Your next appointment:   6 month(s)  The format for your next appointment:   In Person  Provider:   Tereso Newcomer, PA-C         Other Instructions

## 2021-03-21 NOTE — Assessment & Plan Note (Signed)
Status post ICD.  Continue follow-up with EP as planned.  His device is reaching ERI.

## 2021-03-21 NOTE — Assessment & Plan Note (Addendum)
Ischemic CM.  EF 25-30.  NYHA II-IIb.  Volume status stable.  He is on max GDMT for HFrEF.  Continue carvedilol 25 mg twice daily, empagliflozin 12.5 mg daily, Entresto 97/23 mg twice daily, spironolactone 25 mg daily.  Obtain follow-up BMET today.  Follow-up in 6 months.

## 2021-03-21 NOTE — Assessment & Plan Note (Signed)
History of anterior STEMI in 2008 treated a BMS to the LAD.  He is doing well without anginal symptoms.  Continue aspirin 81 mg daily, simvastatin 40 mg daily.  Follow-up in 6 months

## 2021-03-21 NOTE — Progress Notes (Signed)
Cardiology Office Note:    Date:  03/21/2021   ID:  [name] [name], DOB Sep 17, 1960, MRN [id number]  PCP:  Sherald Hess., MD  [facility] Providers Cardiologist:  Sherren Mocha, MD Cardiology APP:  Marcina Millard, PA-C  Electrophysiologist:  Thompson Grayer, MD     Referring MD: Sherald Hess., MD   Chief Complaint:  F/u for CAD, CHF    Patient Profile: Patient Active Problem List   Diagnosis Date Noted   Ventricular tachycardia 03/21/2021    S/p AICD Tx with ATP in 08/2020    Preoperative cardiovascular examination 03/21/2021   Meibomian gland dysfunction of right eye 01/12/2021   Major depressive disorder, recurrent, mild ([place]) 01/12/2021   Obstructive sleep apnea 01/12/2021    May 23, 2016 Entered By: Ripley Fraise Comment: bipap 19/15.0, large simplus    Implantable cardioverter-defibrillator (ICD) in situ 11/15/2011   HFrEF (heart failure with reduced ejection fraction) ([place]) 06/27/2010    Ischemic CM s/p ICD  Large anterior MI in 2008 tx with BMS to LAD  cMRI 6/10:  Ant, inf, septal, apical AK with full thickness scar, EF 23%  Echo 4/12: Inferoapical, apical, septal and dist ant AK, inferobasal HK, mild LVH, EF 25-30%, mild MR, mild LAE  Echocardiogram 8/22: EF 25-30, ant-sept, apical and ant-lat AK, Gr 1 DD, normal RVSF, mild RAE    Ischemic cardiomyopathy 09/07/2008   CAD (coronary artery disease) 07/24/2008    Anterior STEMI in 2008 s/p 2.75 x 28 mm Vision BMS to LAD     Hyperlipidemia 07/19/2008   Essential hypertension 04/07/2006   GERD 04/07/2006     History of Present Illness:   [name] [name] is a 61 y.o. male with the above problem list.  He was last seen in 9/22.  He has followed up in the Pharm.D. clinic since then for further titration of GDMT for HFrEF.  At last visit in November, he was on max dose Entresto, carvedilol, spironolactone and empagliflozin.  He returns for follow-up.  He is here alone.  He is  doing well without chest pain, significant shortness of breath, syncope, orthopnea, leg edema.  He notes he is scheduled for umbilical hernia repair next month.      Past Medical History:  Diagnosis Date   CAD (coronary artery disease)    anterior MI 2008, treated with bare metal stent to LAD   Chronic systolic heart failure ([place]) 06/27/2010   Ischemic CM s/p ICD // Large anterior MI in 2008 tx with BMS to LAD // cMRI 6/10:  Ant, inf, septal, apical AK with full thickness scar, EF 23% // Echo 4/12: Inferoapical, apical, septal and dist ant AK, inferobasal HK, mild LVH, EF 25-30%, mild MR, mild LAE    GERD (gastroesophageal reflux disease)    HLD (hyperlipidemia)    HTN (hypertension)    Ischemic cardiomyopathy    NYHA Class II/III CHF   MI (myocardial infarction) ([place])    Pericarditis; 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   Umbilical hernia    Ventricular tachycardia 07/29/2018   received appropriate ATP therapy for VT at 218 bp with syncope   Current Medications: Current Meds  Medication Sig   ARIPiprazole (ABILIFY) 10 MG tablet Take 10 mg by mouth as needed (for ptsd).   aspirin 81 MG tablet Take 81 mg by mouth daily.   carvedilol (COREG) 25 MG tablet Take 1 tablet (25 mg total) by mouth 2 (two) times daily with  a meal.   cholecalciferol (VITAMIN D) 1000 UNITS tablet Take 1,000 Units by mouth daily.   empagliflozin (JARDIANCE) 25 MG TABS tablet Take 12.5 mg by mouth daily. From [place].  Take 1/2 tablet by mouth once a day   fish oil-omega-3 fatty acids 1000 MG capsule Take 1 g by mouth daily.    fluticasone (FLONASE) 50 MCG/ACT nasal spray Place 2 sprays into both nostrils as needed for allergies or rhinitis.   folic acid (FOLVITE) 1 MG tablet TAKE 2 TABLETS BY MOUTH EVERY DAY ###   loratadine (CLARITIN) 10 MG tablet Take 10 mg by mouth daily as needed for allergies.   Multiple Vitamin (MULTIVITAMIN) tablet Take 1 tablet by mouth daily.     nitroGLYCERIN (NITROSTAT) 0.4 MG SL tablet Place 1 tablet  (0.4 mg total) under the tongue every 5 (five) minutes x 3 doses as needed for chest pain.   PARoxetine (PAXIL) 40 MG tablet Take 40 mg by mouth as needed (for PTSD).   sacubitril-valsartan (ENTRESTO) 97-103 MG Take 1 tablet by mouth 2 (two) times daily.   simvastatin (ZOCOR) 80 MG tablet TAKE 1/2 TABLET (40 MG TOTAL) BY MOUTH AT BEDTIME.   spironolactone (ALDACTONE) 25 MG tablet Take 1 tablet (25 mg total) by mouth daily.   vitamin C (ASCORBIC ACID) 500 MG tablet Take 500 mg by mouth daily.    Allergies:   Patient has no known allergies.   Social History   Tobacco Use   Smoking status: Former    Types: Cigars    Quit date: 03/04/2006    Years since quitting: 15.0   Smokeless tobacco: Never  Vaping Use   Vaping Use: Never used  Substance Use Topics   Alcohol use: Yes    Comment: occasionally   Drug use: No    Family Hx: The patient's family history includes Diabetes in his mother and unknown relative; Heart Problems in his father; Heart failure in his unknown relative; Hypertension in his mother.  Review of Systems  Gastrointestinal:  Negative for hematochezia.  Genitourinary:  Negative for hematuria.    EKGs/Labs/Other Test Reviewed:    EKG:  EKG is  not ordered today.  The ekg ordered today demonstrates n/a  Recent Labs: 10/11/2020: NT-Pro BNP 82 12/22/2020: BUN 16; Creatinine, Ser 1.22; Potassium 4.0; Sodium 140   Recent Lipid Panel No results for input(s): CHOL, TRIG, HDL, VLDL, LDLCALC, LDLDIRECT in the last 8760 hours.   Risk Assessment/Calculations:         Physical Exam:    VS:  BP 100/60 (BP Location: Right Arm)    Pulse 81    Ht 5\' 10"  ([id number] m)    Wt 240 lb 3.2 oz (109 kg)    SpO2 97%    BMI 34.47 kg/m     Wt Readings from Last 3 Encounters:  03/21/21 240 lb 3.2 oz (109 kg)  01/12/21 241 lb 6.4 oz (109.5 kg)  12/22/20 243 lb 6.4 oz (110.4 kg)    Constitutional:      Appearance: Healthy appearance. Not in distress.  Neck:     Thyroid: No thyromegaly.      Vascular: JVD normal.  Pulmonary:     Effort: Pulmonary effort is normal.     Breath sounds: No wheezing. No rales.  Cardiovascular:     Normal rate. Regular rhythm. Normal S1. Normal S2.      Murmurs: There is no murmur.  Edema:    Peripheral edema absent.  Abdominal:  Palpations: Abdomen is soft. There is no hepatomegaly.  Skin:    General: Skin is warm and dry.  Neurological:     General: No focal deficit present.     Mental Status: Alert and oriented to person, place and time.     Cranial Nerves: Cranial nerves are intact.         ASSESSMENT & PLAN:   HFrEF (heart failure with reduced ejection fraction) (HCC) Ischemic CM.  EF 25-30.  NYHA II-IIb.  Volume status stable.  He is on max GDMT for HFrEF.  Continue carvedilol 25 mg twice daily, empagliflozin 12.5 mg daily, Entresto 97/23 mg twice daily, spironolactone 25 mg daily.  Obtain follow-up BMET today.  Follow-up in 6 months.  CAD (coronary artery disease) History of anterior STEMI in 2008 treated a BMS to the LAD.  He is doing well without anginal symptoms.  Continue aspirin 81 mg daily, simvastatin 40 mg daily.  Follow-up in 6 months  Essential hypertension Blood pressures well controlled on current therapy.  Hyperlipidemia Continue simvastatin 40 mg daily.  I will request his most recent lipid panel from the [facility]  Ventricular tachycardia Status post ICD.  Continue follow-up with EP as planned.  His device is reaching ERI.  Preoperative cardiovascular examination Mr. Picazo is to have umbilical herniorrhaphy soon.  HIs perioperative risk of a major cardiac event is 11% according to the Revised Cardiac Risk Index (RCRI).  Therefore, he is at high risk for perioperative complications.   His functional capacity is good at 4.74 METs according to the [facility] Activity Status Index (DASI). Recommendations: According to ACC/AHA guidelines, no further cardiovascular testing needed.  The patient may  proceed to surgery at acceptable risk.   Antiplatelet and/or Anticoagulation Recommendations: Aspirin should be continued without interruption.  However, if the bleeding risk is too great, ASA can be held for 5-7 days prior to his surgery.  Please resume Aspirin post operatively when it is felt to be safe from a bleeding standpoint.            Dispo:  Return in about 6 months (around 09/18/2021) for Routine Follow Up, w/  Richardson Dopp, PA-C.   Medication Adjustments/Labs and Tests Ordered: Current medicines are reviewed at length with the patient today.  Concerns regarding medicines are outlined above.  Tests Ordered: Orders Placed This Encounter  Procedures   Basic Metabolic Panel (BMET)   Medication Changes: No orders of the defined types were placed in this encounter.  Signed, Richardson Dopp, PA-C  03/21/2021 3:37 PM    [facility] [place], [place],   [address] Phone: [phone]; Fax: [phone]

## 2021-03-23 LAB — BASIC METABOLIC PANEL
BUN/Creatinine Ratio: 12 (ref 10–24)
BUN: 14 mg/dL (ref 8–27)
CO2: 19 mmol/L — ABNORMAL LOW (ref 20–29)
Calcium: 9.1 mg/dL (ref 8.6–10.2)
Chloride: 104 mmol/L (ref 96–106)
Creatinine, Ser: 1.16 mg/dL (ref 0.76–1.27)
Glucose: 113 mg/dL — ABNORMAL HIGH (ref 70–99)
Potassium: 4.2 mmol/L (ref 3.5–5.2)
Sodium: 141 mmol/L (ref 134–144)
eGFR: 72 mL/min/{1.73_m2} (ref >59)

## 2021-03-28 NOTE — Progress Notes (Signed)
Remote ICD transmission.   

## 2021-04-10 ENCOUNTER — Telehealth: Payer: Self-pay

## 2021-04-10 DIAGNOSIS — I472 Ventricular tachycardia, unspecified: Secondary | ICD-10-CM

## 2021-04-10 DIAGNOSIS — I255 Ischemic cardiomyopathy: Secondary | ICD-10-CM

## 2021-04-10 NOTE — Telephone Encounter (Signed)
Merlin alert- SJM single chamber ICD has reached ERI as of 04/09/21.  LOV 10/11/2020, MD notes indicate ok to proceed with procedure scheduling when device reaches ERI.     Spoke with patient, he did receive alert notification from device.    Advised Dr. Jackalyn Lombard nurse will contact him to schedule procedure.

## 2021-04-12 NOTE — Telephone Encounter (Signed)
Call placed to Pt.  Pt scheduled for gen change April 27, 2021.  Lab work scheduled for 04/18/2021.  Will meet with Pt to go over instructions and give soap  Work up complete

## 2021-04-18 ENCOUNTER — Other Ambulatory Visit: Payer: Self-pay

## 2021-04-18 ENCOUNTER — Other Ambulatory Visit: Payer: Self-pay | Admitting: *Deleted

## 2021-04-18 DIAGNOSIS — I255 Ischemic cardiomyopathy: Secondary | ICD-10-CM

## 2021-04-18 DIAGNOSIS — I472 Ventricular tachycardia, unspecified: Secondary | ICD-10-CM

## 2021-04-18 LAB — CBC WITH DIFFERENTIAL/PLATELET
Basophils Absolute: 0.1 10*3/uL (ref 0.0–0.2)
Basos: 1 %
EOS (ABSOLUTE): 0.2 10*3/uL (ref 0.0–0.4)
Eos: 1 %
Hematocrit: 47.1 % (ref 37.5–51.0)
Hemoglobin: 16.2 g/dL (ref 13.0–17.7)
Immature Grans (Abs): 0 10*3/uL (ref 0.0–0.1)
Immature Granulocytes: 0 %
Lymphocytes Absolute: 2.9 10*3/uL (ref 0.7–3.1)
Lymphs: 27 %
MCH: 29 pg (ref 26.6–33.0)
MCHC: 34.4 g/dL (ref 31.5–35.7)
MCV: 84 fL (ref 79–97)
Monocytes Absolute: 1 10*3/uL — ABNORMAL HIGH (ref 0.1–0.9)
Monocytes: 9 %
Neutrophils Absolute: 6.6 10*3/uL (ref 1.4–7.0)
Neutrophils: 62 %
Platelets: 197 10*3/uL (ref 150–450)
RBC: 5.58 x10E6/uL (ref 4.14–5.80)
RDW: 12.5 % (ref 11.6–15.4)
WBC: 10.7 10*3/uL (ref 3.4–10.8)

## 2021-04-18 LAB — BASIC METABOLIC PANEL
BUN/Creatinine Ratio: 16 (ref 10–24)
BUN: 16 mg/dL (ref 8–27)
CO2: 25 mmol/L (ref 20–29)
Calcium: 9.2 mg/dL (ref 8.6–10.2)
Chloride: 98 mmol/L (ref 96–106)
Creatinine, Ser: 1.02 mg/dL (ref 0.76–1.27)
Glucose: 106 mg/dL — ABNORMAL HIGH (ref 70–99)
Potassium: 4.5 mmol/L (ref 3.5–5.2)
Sodium: 136 mmol/L (ref 134–144)
eGFR: 84 mL/min/{1.73_m2} (ref >59)

## 2021-04-19 ENCOUNTER — Ambulatory Visit (INDEPENDENT_AMBULATORY_CARE_PROVIDER_SITE_OTHER): Payer: Self-pay

## 2021-04-19 DIAGNOSIS — I255 Ischemic cardiomyopathy: Secondary | ICD-10-CM

## 2021-04-19 LAB — CUP PACEART REMOTE DEVICE CHECK
Battery Remaining Longevity: 0 mo
Battery Voltage: 2.59 V
Brady Statistic RV Percent Paced: 1 %
Date Time Interrogation Session: 20230216021140
HighPow Impedance: 77 Ohm
HighPow Impedance: 77 Ohm
Implantable Lead Implant Date: 20120605
Implantable Lead Location: 753860
Implantable Pulse Generator Implant Date: 20120605
Lead Channel Impedance Value: 480 Ohm
Lead Channel Pacing Threshold Amplitude: 0.75 V
Lead Channel Pacing Threshold Pulse Width: 0.5 ms
Lead Channel Sensing Intrinsic Amplitude: 12 mV
Lead Channel Setting Pacing Amplitude: 2.5 V
Lead Channel Setting Pacing Pulse Width: 0.5 ms
Lead Channel Setting Sensing Sensitivity: 0.5 mV
Pulse Gen Serial Number: 809738

## 2021-04-23 ENCOUNTER — Encounter: Payer: Self-pay | Admitting: Internal Medicine

## 2021-04-24 ENCOUNTER — Telehealth: Payer: Self-pay

## 2021-04-24 NOTE — Progress Notes (Signed)
Remote ICD transmission.   

## 2021-04-24 NOTE — Telephone Encounter (Signed)
Call placed to Pt.  Advised arrival time for procedure is now 7:30 am.  Pt indicates understanding.

## 2021-04-26 ENCOUNTER — Encounter (HOSPITAL_BASED_OUTPATIENT_CLINIC_OR_DEPARTMENT_OTHER): Payer: Self-pay | Admitting: Internal Medicine

## 2021-04-26 NOTE — Pre-Procedure Instructions (Signed)
Instructed patient on the following items: Arrival time 0730 Nothing to eat or drink after midnight No meds AM of procedure Responsible person to drive you home and stay with you for 24 hrs Wash with special soap night before and morning of procedure  

## 2021-04-27 ENCOUNTER — Ambulatory Visit (HOSPITAL_COMMUNITY)
Admission: RE | Admit: 2021-04-27 | Discharge: 2021-04-27 | Disposition: A | Discharge status: Home / Self Care | Payer: Self-pay | Attending: Internal Medicine | Admitting: Internal Medicine

## 2021-04-27 ENCOUNTER — Encounter (HOSPITAL_COMMUNITY): Payer: Self-pay | Admitting: Internal Medicine

## 2021-04-27 ENCOUNTER — Encounter (HOSPITAL_COMMUNITY)
Admission: RE | Disposition: A | Discharge status: Home / Self Care | Payer: Self-pay | Source: Home / Self Care | Attending: Internal Medicine

## 2021-04-27 DIAGNOSIS — I255 Ischemic cardiomyopathy: Secondary | ICD-10-CM

## 2021-04-27 DIAGNOSIS — I5022 Chronic systolic (congestive) heart failure: Secondary | ICD-10-CM | POA: Insufficient documentation

## 2021-04-27 DIAGNOSIS — Z4502 Encounter for adjustment and management of automatic implantable cardiac defibrillator: Secondary | ICD-10-CM | POA: Insufficient documentation

## 2021-04-27 DIAGNOSIS — I472 Ventricular tachycardia, unspecified: Secondary | ICD-10-CM | POA: Insufficient documentation

## 2021-04-27 DIAGNOSIS — I2510 Atherosclerotic heart disease of native coronary artery without angina pectoris: Secondary | ICD-10-CM | POA: Insufficient documentation

## 2021-04-27 DIAGNOSIS — I252 Old myocardial infarction: Secondary | ICD-10-CM | POA: Insufficient documentation

## 2021-04-27 DIAGNOSIS — Z955 Presence of coronary angioplasty implant and graft: Secondary | ICD-10-CM | POA: Insufficient documentation

## 2021-04-27 DIAGNOSIS — I110 Hypertensive heart disease with heart failure: Secondary | ICD-10-CM | POA: Insufficient documentation

## 2021-04-27 HISTORY — PX: ICD GENERATOR CHANGEOUT: EP1231

## 2021-04-27 SURGERY — ICD GENERATOR CHANGEOUT

## 2021-04-27 MED ORDER — CEFAZOLIN SODIUM-DEXTROSE 2-4 GM/100ML-% IV SOLN
2.0000 g | INTRAVENOUS | Status: AC
  Administered 2021-04-27: 2 g via INTRAVENOUS

## 2021-04-27 MED ORDER — SODIUM CHLORIDE 0.9 % IV SOLN: 250.0000 mL | INTRAVENOUS | Status: DC | PRN

## 2021-04-27 MED ORDER — POVIDONE-IODINE 10 % EX SWAB
2.0000 "application " | Freq: Once | CUTANEOUS | Status: AC
  Administered 2021-04-27: 2 via TOPICAL

## 2021-04-27 MED ORDER — CHLORHEXIDINE GLUCONATE 4 % EX LIQD
4.0000 "application " | Freq: Once | CUTANEOUS | Status: DC
  Filled 2021-04-27: qty 60

## 2021-04-27 MED ORDER — SODIUM CHLORIDE 0.9 % IV SOLN: INTRAVENOUS | Status: DC

## 2021-04-27 MED ORDER — SODIUM CHLORIDE 0.9 % IV SOLN
80.0000 mg | INTRAVENOUS | Status: AC
  Administered 2021-04-27: 80 mg

## 2021-04-27 MED ORDER — SODIUM CHLORIDE 0.9 % IV SOLN
INTRAVENOUS | Status: AC
  Filled 2021-04-27: qty 2

## 2021-04-27 MED ORDER — ACETAMINOPHEN 325 MG PO TABS: 325.0000 mg | ORAL_TABLET | ORAL | Status: DC | PRN

## 2021-04-27 MED ORDER — CEFAZOLIN SODIUM-DEXTROSE 2-4 GM/100ML-% IV SOLN
INTRAVENOUS | Status: AC
  Filled 2021-04-27: qty 100

## 2021-04-27 MED ORDER — SODIUM CHLORIDE 0.9% FLUSH: 3.0000 mL | INTRAVENOUS | Status: DC | PRN

## 2021-04-27 MED ORDER — LIDOCAINE HCL (PF) 1 % IJ SOLN
INTRAMUSCULAR | Status: DC | PRN
  Administered 2021-04-27: 60 mL

## 2021-04-27 MED ORDER — SODIUM CHLORIDE 0.9% FLUSH: 3.0000 mL | Freq: Two times a day (BID) | INTRAVENOUS | Status: DC

## 2021-04-27 MED ORDER — ONDANSETRON HCL 4 MG/2ML IJ SOLN: 4.0000 mg | Freq: Four times a day (QID) | INTRAMUSCULAR | Status: DC | PRN

## 2021-04-27 MED ORDER — LIDOCAINE HCL (PF) 1 % IJ SOLN
INTRAMUSCULAR | Status: AC
  Filled 2021-04-27: qty 60

## 2021-04-27 SURGICAL SUPPLY — 4 items
CABLE SURGICAL S-101-97-12 (CABLE) ×3 IMPLANT
ICD GALLANT VR CDVRA500Q (ICD Generator) ×1 IMPLANT
PAD DEFIB RADIO PHYSIO CONN (PAD) ×3 IMPLANT
TRAY PACEMAKER INSERTION (PACKS) ×3 IMPLANT

## 2021-04-27 NOTE — H&P (Signed)
PCP: Frederich Chick., MD Primary Cardiologist: Dr Excell Seltzer Primary EP: Dr Johney Frame   [name] [name] is a 61 y.o. male who presents today for ICD generator change.  Since last being seen in our clinic, the patient reports doing very well.  Today, he denies symptoms of palpitations, chest pain, shortness of breath,  lower extremity edema, dizziness, presyncope, syncope, or ICD shocks.  The patient is otherwise without complaint today.        Past Medical History:  Diagnosis Date   CAD (coronary artery disease)      anterior MI 2008, treated with bare metal stent to LAD   Chronic systolic heart failure (HCC) 06/27/2010    Ischemic CM s/p ICD // Large anterior MI in 2008 tx with BMS to LAD // cMRI 6/10:  Ant, inf, septal, apical AK with full thickness scar, EF 23% // Echo 4/12: Inferoapical, apical, septal and dist ant AK, inferobasal HK, mild LVH, EF 25-30%, mild MR, mild LAE    GERD (gastroesophageal reflux disease)     HLD (hyperlipidemia)     HTN (hypertension)     Ischemic cardiomyopathy      NYHA Class II/III CHF   MI (myocardial infarction) (HCC)     Pericarditis; post-MI      w/o recurrence   Umbilical hernia     Ventricular tachycardia (HCC) 07/29/2018    received appropriate ATP therapy for VT at 218 bp with syncope         Past Surgical History:  Procedure Laterality Date   CARDIAC DEFIBRILLATOR PLACEMENT   08/07/10    SJM Fortify VR ST implanted by Dr Johney Frame, part of the Analyze ST ICD study   FINGER SURGERY   2/09    laceration repair      ROS- all systems are reviewed and negative except as per HPI above         Current Outpatient Medications  Medication Sig Dispense Refill   aspirin 81 MG tablet Take 81 mg by mouth once.        carvedilol (COREG) 25 MG tablet Take 1 tablet (25 mg total) by mouth 2 (two) times daily with a meal. 60 tablet 0   cholecalciferol (VITAMIN D) 1000 UNITS tablet Take 1,000 Units by mouth daily.       fish oil-omega-3 fatty acids 1000  MG capsule Take 1 g by mouth daily.        lisinopril (PRINIVIL,ZESTRIL) 10 MG tablet Take 1 tablet (10 mg total) by mouth daily. 30 tablet 0   Multiple Vitamin (MULTIVITAMIN) tablet Take 1 tablet by mouth daily.         nitroGLYCERIN (NITROSTAT) 0.4 MG SL tablet Place 0.4 mg under the tongue every 5 (five) minutes x 3 doses as needed for chest pain.       simvastatin (ZOCOR) 80 MG tablet TAKE 1/2 TABLET (40 MG TOTAL) BY MOUTH AT BEDTIME.       spironolactone (ALDACTONE) 25 MG tablet Take 0.5 tablets (12.5 mg total) by mouth daily. 30 tablet 0    No current facility-administered medications for this visit.      Physical Exam: Vitals:   04/27/21 0705  BP: (!) 123/91  Pulse: 68  Resp: 18  Temp: 97.7 F (36.5 C)  SpO2: 98%    GEN- The patient is well appearing, alert and oriented x 3 today.   Head- normocephalic, atraumatic Eyes-  Sclera clear, conjunctiva pink Ears- hearing intact Oropharynx- clear Lungs- Clear to ausculation bilaterally,  normal work of breathing Chest- ICD pocket is well healed Heart- Regular rate and rhythm, no murmurs, rubs or gallops, PMI not laterally displaced GI- soft, NT, ND, + BS Extremities- no clubbing, cyanosis, or edema     ekg tracing ordered today is personally reviewed and shows sinus rhythm, narrow QRS      Wt Readings from Last 3 Encounters:  09/02/19 240 lb (108.9 kg)  08/17/18 200 lb (90.7 kg)  03/17/18 208 lb 12.8 oz (94.7 kg)      Assessment and Plan:   1.  Chronic systolic dysfunction/ VT/ CAD euvolemic today He had a single episode of VT (CL 250 msec) successfully treated with ATP x 1 (08/29/20). Stable on an appropriate medical regimen Normal ICD function  ICD Criteria  Current LVEF:25%. Within 12 months prior to implant: Yes   Heart failure history: Yes, Class II  Cardiomyopathy history: Yes, Ischemic Cardiomyopathy - Prior MI.  Atrial Fibrillation/Atrial Flutter: No.  Ventricular tachycardia history: Yes, Hemodynamic  instability present. VT Type: Sustained Ventricular Tachycardia - Monomorphic.  Cardiac arrest history: Yes, Ventricular Tachycardia.  History of syndromes with risk of sudden death: No.  Previous ICD: Yes, Reason for ICD:  Primary prevention.  Current ICD indication: Secondary  PPM indication: No.  Class I or II Bradycardia indication present: No  Beta Blocker therapy for 3 or more months: Yes, prescribed.   Ace Inhibitor/ARB therapy for 3 or more months: Yes, prescribed.    I have seen [name] [name] is a 61 y.o. male presents today for consideration of ICD generator change for secondary prevention of sudden death.  The patient's chart has been reviewed and they meet criteria for ICD replacement.  I have had a thorough discussion with the patient reviewing options.  The patient has had opportunities to ask questions and have them answered. The patient and I have decided together through the [place] Support Tool to proceed with ICD generator change at this time.  Risks, benefits, alternatives to ICD replacement were discussed in detail with the patient today. The patient  understands that the risks include but are not limited to bleeding, infection, pneumothorax, perforation, tamponade, vascular damage, renal failure, MI, stroke, death, inappropriate shocks, and lead dislodgement and  wishes to proceed.

## 2021-04-27 NOTE — Discharge Instructions (Signed)

## 2021-05-10 ENCOUNTER — Ambulatory Visit: Payer: Self-pay

## 2021-05-10 ENCOUNTER — Other Ambulatory Visit: Payer: Self-pay

## 2021-05-10 DIAGNOSIS — I472 Ventricular tachycardia, unspecified: Secondary | ICD-10-CM

## 2021-05-10 LAB — CUP PACEART INCLINIC DEVICE CHECK
Battery Remaining Longevity: 123 mo
Brady Statistic RV Percent Paced: 0 %
Date Time Interrogation Session: 20230309130845
HighPow Impedance: 66.375
HighPow Impedance: 66.375
Implantable Pulse Generator Implant Date: 20230224
Lead Channel Impedance Value: 487.5 Ohm
Lead Channel Impedance Value: 487.5 Ohm
Lead Channel Pacing Threshold Amplitude: 1 V
Lead Channel Pacing Threshold Pulse Width: 0.5 ms
Lead Channel Sensing Intrinsic Amplitude: 11.8 mV
Lead Channel Setting Pacing Amplitude: 2.5 V
Lead Channel Setting Pacing Pulse Width: 0.5 ms
Lead Channel Setting Sensing Sensitivity: 0.5 mV
Pulse Gen Serial Number: 111056600

## 2021-05-10 NOTE — Progress Notes (Signed)
Wound check appointment. Steri-strips removed. Wound without redness or edema. Incision edges approximated, wound well healed. Normal device function. Thresholds, sensing, and impedances consistent with implant measurements. Device programmed at 3.5V for extra safety margin until 3 month visit. Histogram distribution appropriate for patient and level of activity. No ventricular arrhythmias noted. Patient educated about wound car and shock plan. ROV in 3 months with implanting physician. ?

## 2021-05-10 NOTE — Patient Instructions (Addendum)
   After Your ICD (Implantable Cardiac Defibrillator)    Monitor your defibrillator site for redness, swelling, and drainage. Call the device clinic at [phone] if you experience these symptoms or fever/chills.  Your incision was closed with Steri-strips or staples:  You may shower 7 days after your procedure and wash your incision with soap and water. Avoid lotions, ointments, or perfumes over your incision until it is well-healed.  You may use a hot tub or a pool after your wound check appointment if the incision is completely closed.   Your ICD is designed to protect you from life threatening heart rhythms. Because of this, you may receive a shock.   1 shock with no symptoms:  Call the office during business hours. 1 shock with symptoms (chest pain, chest pressure, dizziness, lightheadedness, shortness of breath, overall feeling unwell):  Call 911. If you experience 2 or more shocks in 24 hours:  Call 911. If you receive a shock, you should not drive.  [place] DMV - no driving for 6 months if you receive appropriate therapy from your ICD.   ICD Alerts:  Some alerts are vibratory and others beep. These are NOT emergencies. Please call our office to let us know. If this occurs at night or on weekends, it can wait until the next business day. Send a remote transmission.  If your device is capable of reading fluid status (for heart failure), you will be offered monthly monitoring to review this with you.   Remote monitoring is used to monitor your ICD from home. This monitoring is scheduled every 91 days by our office. It allows us to keep an eye on the functioning of your device to ensure it is working properly. You will routinely see your Electrophysiologist annually (more often if necessary).  

## 2021-06-06 ENCOUNTER — Telehealth: Payer: Self-pay

## 2021-06-06 NOTE — Telephone Encounter (Addendum)
-----   Message from Thompson Grayer, MD sent at 04/27/2021 10:01 AM EST ----- ? ?[name] [name] just had gen change today.  He would like to enroll in ICM.    ? ?

## 2021-06-06 NOTE — Telephone Encounter (Signed)
Spoke with patient and ICM intro given.  Patient is agreeable to ICM monthly follow up.  Advised transmission will send automatically between 12 Midnight and 6:00 AM if monitor is by bedside.  Explained will call with results after transmission is reviewed.  Explained a Remote Home Transmissions are received between 12 midnight and 6 AM so there is no obligation to stay by the monitor at that time appointed time during the day. Discussed diet and he reports he and his wife have cut back on eating out.   Provided ICM direct number and explained should call if experiencing any fluid symptoms such as weight gain, shortness of breath or extremity/abdominal swelling.  ?1st ICM remote transmission scheduled for 06/18/2021.  ?

## 2021-06-06 NOTE — Telephone Encounter (Signed)
Referred to ICM clinic by Dr Johney Frame.   Attempted call to patient for ICM intro and left message for return call.   Patient had device battery replacement on 04/27/2021 by Dr Johney Frame.   ? ?06/04/2021 Corvue thoracic impedance suggesting possible fluid accumulation from 3/29-4/3.   ? ? ?

## 2021-06-18 ENCOUNTER — Ambulatory Visit (INDEPENDENT_AMBULATORY_CARE_PROVIDER_SITE_OTHER): Payer: Self-pay

## 2021-06-18 DIAGNOSIS — Z95810 Presence of automatic (implantable) cardiac defibrillator: Secondary | ICD-10-CM

## 2021-06-18 DIAGNOSIS — I5022 Chronic systolic (congestive) heart failure: Secondary | ICD-10-CM

## 2021-06-20 NOTE — Progress Notes (Signed)
EPIC Encounter for ICM Monitoring ? ?Patient Name: [name] [name] is a 61 y.o. male ?Date: 06/20/2021 ?Primary Care Physican: Frederich Chick., MD ?Primary Cardiologist: Excell Seltzer  ?Electrophysiologist: Allred ?06/20/2021 Weight: 233 lbs      ? ?1st ICM Remote Transmission.  Heart Failure questions reviewed.  Pt asymptomatic and feeling well.  He gets exercise with doing some yard work.  He may not be consistent for fluid intake and encouraged to drink approximately 64 oz daily. ?  ?CorVue thoracic impedance suggesting normal fluid levels but was suggesting possible dryness from 4/4-4/14.  ?Prescribed:  ?Spironolactone 25 mg take 1 tablet daily ? ?Recommendations: Reinforced limiting salt intake to < 2000 mg daily and fluid intake to 64 oz daily.  Encouraged to call if experiencing fluid symptoms. ? ?Follow-up plan: ICM clinic phone appointment on 07/23/2021.   91 day device clinic remote transmission 08/09/2021.   ? ?EP/Cardiology Office Visits: 08/15/2021 with Tereso Newcomer, PA.  08/17/2021 with Dr Johney Frame. ? ?Copy of ICM check sent to Dr. Johney Frame.  ? ?3 month ICM trend: 06/18/2021. ? ? ? ?12-14 Month ICM trend:  ? ? ? ?Karie Soda, RN ?06/20/2021 ?10:06 AM ? ?

## 2021-06-30 ENCOUNTER — Emergency Department (HOSPITAL_COMMUNITY)
Admission: EM | Admit: 2021-06-30 | Discharge: 2021-06-30 | Disposition: A | Discharge status: Home / Self Care | Payer: No Typology Code available for payment source | Attending: Emergency Medicine | Admitting: Emergency Medicine

## 2021-06-30 ENCOUNTER — Encounter (HOSPITAL_COMMUNITY): Payer: Self-pay | Admitting: Emergency Medicine

## 2021-06-30 ENCOUNTER — Other Ambulatory Visit: Payer: Self-pay

## 2021-06-30 DIAGNOSIS — S80262A Insect bite (nonvenomous), left knee, initial encounter: Secondary | ICD-10-CM | POA: Insufficient documentation

## 2021-06-30 DIAGNOSIS — Z7982 Long term (current) use of aspirin: Secondary | ICD-10-CM | POA: Insufficient documentation

## 2021-06-30 DIAGNOSIS — W57XXXA Bitten or stung by nonvenomous insect and other nonvenomous arthropods, initial encounter: Secondary | ICD-10-CM | POA: Diagnosis not present

## 2021-06-30 DIAGNOSIS — Z79899 Other long term (current) drug therapy: Secondary | ICD-10-CM | POA: Insufficient documentation

## 2021-06-30 MED ORDER — DOXYCYCLINE HYCLATE 100 MG PO CAPS: 100.0000 mg | ORAL_CAPSULE | Freq: Two times a day (BID) | ORAL | 0 refills | Status: DC

## 2021-06-30 NOTE — Discharge Instructions (Signed)
Please take antibiotics as prescribed.  [facility]  Return to the emergency department for any worsening symptoms. ?

## 2021-06-30 NOTE — ED Triage Notes (Signed)
Patient c/o tick bite on left leg. Per patient removed tick on Wednesday and has had swelling with redness since. Denies pain but reports itching. Using rubbing alcohol on area. Denies any fevers.  ?

## 2021-06-30 NOTE — ED Provider Notes (Signed)
?[facility] EMERGENCY DEPARTMENT ?Provider Note ? ? ?CSN: [id number] ?Arrival date & time: 06/30/21  1358 ? ?  ? ?History ?Chief Complaint  ?Patient presents with  ? Insect Bite  ? ? ?[name] [name] is a 61 y.o. male who presents to the emergency department today with a tick bite which is been there for 4 days.  Patient states that he removed tick body and tick head.  Since then he has been having a lot of pruritus to the area in addition to erythema.  No diffuse rash, fever, chills, malaise, or myalgias.  No cough or congestion.  Tick bite is localized to the posterior left knee. ? ?HPI ? ?  ? ?Home Medications ?Prior to Admission medications   ?Medication Sig Start Date End Date Taking? Authorizing Provider  ?doxycycline (VIBRAMYCIN) 100 MG capsule Take 1 capsule (100 mg total) by mouth 2 (two) times daily. 06/30/21  Yes Teressa Lower, PA-C  ?ARIPiprazole (ABILIFY) 10 MG tablet Take 10 mg by mouth daily as needed (for ptsd).    [provider]  ?aspirin 81 MG tablet Take 81 mg by mouth daily.    [provider]  ?carvedilol (COREG) 25 MG tablet Take 1 tablet (25 mg total) by mouth 2 (two) times daily with a meal. 07/26/14   Tonny Bollman, MD  ?cholecalciferol (VITAMIN D) 1000 UNITS tablet Take 1,000 Units by mouth daily.    [provider]  ?empagliflozin (JARDIANCE) 25 MG TABS tablet Take 12.5 mg by mouth daily. From [place].  Take 1/2 tablet by mouth once a day    [provider]  ?fish oil-omega-3 fatty acids 1000 MG capsule Take 1 g by mouth daily.     [provider]  ?fluticasone (FLONASE) 50 MCG/ACT nasal spray Place 2 sprays into both nostrils as needed for allergies or rhinitis.    [provider]  ?loratadine (CLARITIN) 10 MG tablet Take 10 mg by mouth daily as needed for allergies.    [provider]  ?Multiple Vitamin (MULTIVITAMIN) tablet Take 1 tablet by mouth daily.      [provider]  ?nitroGLYCERIN (NITROSTAT) 0.4 MG SL  tablet Place 1 tablet (0.4 mg total) under the tongue every 5 (five) minutes x 3 doses as needed for chest pain. 10/31/20   Allred, Fayrene Fearing, MD  ?PARoxetine (PAXIL) 40 MG tablet Take 40 mg by mouth daily as needed (for PTSD).    [provider]  ?sacubitril-valsartan (ENTRESTO) 97-103 MG Take 1 tablet by mouth 2 (two) times daily. 12/25/20   Tonny Bollman, MD  ?simvastatin (ZOCOR) 80 MG tablet Take 40 mg by mouth at bedtime. 12/20/11   Tonny Bollman, MD  ?spironolactone (ALDACTONE) 25 MG tablet Take 1 tablet (25 mg total) by mouth daily. 01/12/21   Allred, Fayrene Fearing, MD  ?vitamin C (ASCORBIC ACID) 500 MG tablet Take 500 mg by mouth daily.    [provider]  ?   ? ?Allergies    ?Patient has no known allergies.   ? ?Review of Systems   ?Review of Systems  ?All other systems reviewed and are negative. ? ?Physical Exam ?Updated Vital Signs ?BP 115/82 (BP Location: Right Arm)   Pulse 71   Temp 98.5 ?F (36.9 ?C) (Oral)   Resp 16   Ht 5\' 10"  (1.778 m)   Wt 104.8 kg   SpO2 94%   BMI 33.15 kg/m?  ?Physical Exam ?Vitals and nursing note reviewed.  ?Constitutional:   ?   Appearance: Normal  appearance.  ?HENT:  ?   Head: Normocephalic and atraumatic.  ?Eyes:  ?   General:     ?   Right eye: No discharge.     ?   Left eye: No discharge.  ?   Conjunctiva/sclera: Conjunctivae normal.  ?Pulmonary:  ?   Effort: Pulmonary effort is normal.  ?Skin: ?   General: Skin is warm and dry.  ?   Comments: Central puncture wound with approximately 1/2 cm diameter of erythema and slight induration.  No purulence.  Not actively draining.  No surrounding bull's-eye rash.  No other lesions on the body.  No diffuse rash.  ?Neurological:  ?   General: No focal deficit present.  ?   Mental Status: He is alert.  ?Psychiatric:     ?   Mood and Affect: Mood normal.     ?   Behavior: Behavior normal.  ? ? ?ED Results / Procedures / Treatments   ?Labs ?(all labs ordered are listed, but only abnormal results are displayed) ?Labs  Reviewed - No data to display ? ?EKG ?None ? ?Radiology ?No results found. ? ?Procedures ?Procedures  ? ? ?Medications Ordered in ED ?Medications - No data to display ? ?ED Course/ Medical Decision Making/ A&P ?  ?                        ?Medical Decision Making ? ?[name] [name] is a 60 y.o. male who presents to the emergency department with a tick bite.  No evidence of surrounding tickborne illness.  No bull's-eye rash indicate possible Lyme disease.Centripetal rash indicate [facility] spotted fever.  Patient's vital signs are completely normal here.  Given the patient's age and confirmed tick bite, I will prescribe him prophylactic doxycycline for 7 days.  [facility] with [facility] precautions to return to the emergency department if things get worse.  Patient amenable this plan.  All questions or concerns addressed with the patient and family at bedside. ? ?Final Clinical Impression(s) / ED Diagnoses ?Final diagnoses:  ?Tick bite of left knee, initial encounter  ? ? ?Rx / DC Orders ?ED Discharge Orders   ? ?      Ordered  ?  doxycycline (VIBRAMYCIN) 100 MG capsule  2 times daily       ? 06/30/21 1420  ? ?  ?  ? ?  ? ? ?  ?Honor Loh Mason, PA-C ?06/30/21 1424 ? ?  ?Bethann Berkshire, MD ?07/01/21 Rickey Primus ? ?

## 2021-07-23 ENCOUNTER — Ambulatory Visit (INDEPENDENT_AMBULATORY_CARE_PROVIDER_SITE_OTHER): Payer: Self-pay

## 2021-07-23 DIAGNOSIS — I5022 Chronic systolic (congestive) heart failure: Secondary | ICD-10-CM

## 2021-07-23 DIAGNOSIS — Z95810 Presence of automatic (implantable) cardiac defibrillator: Secondary | ICD-10-CM

## 2021-07-25 NOTE — Progress Notes (Signed)
EPIC Encounter for ICM Monitoring  Patient Name: [name] [name] is a 61 y.o. male Date: 07/25/2021 Primary Care Physican: Frederich Chick., MD Primary Cardiologist: Excell Seltzer    Electrophysiologist: Allred 06/20/2021 Weight: 233 lbs   07/25/2021 Weight: 231 lbs                                                 1st ICM Remote Transmission.  Heart Failure questions reviewed.  Pt asymptomatic and feeling well.   He may be drinking more than 64 oz fluid a day.    CorVue thoracic impedance normal but was suggesting possible fluid accumulation from 5/18-5/21 and 5/4-5/8.   Prescribed:  Spironolactone 25 mg take 1 tablet daily   Recommendations: Reinforced limiting fluid intake to 64 oz daily.  Encouraged to call if experiencing fluid symptoms.   Follow-up plan: ICM clinic phone appointment on 08/27/2021.   91 day device clinic remote transmission 08/09/2021.     EP/Cardiology Office Visits: 08/15/2021 with Tereso Newcomer, PA.  08/17/2021 with Dr Johney Frame.  Discussed getting [facility].     Copy of ICM check sent to Dr. Johney Frame.   3 month ICM trend: 07/23/2021.    12-14 Month ICM trend:     Karie Soda, RN 07/25/2021 4:05 PM

## 2021-07-31 ENCOUNTER — Ambulatory Visit (INDEPENDENT_AMBULATORY_CARE_PROVIDER_SITE_OTHER): Payer: Self-pay

## 2021-07-31 DIAGNOSIS — I5022 Chronic systolic (congestive) heart failure: Secondary | ICD-10-CM

## 2021-07-31 DIAGNOSIS — I255 Ischemic cardiomyopathy: Secondary | ICD-10-CM

## 2021-08-01 LAB — CUP PACEART REMOTE DEVICE CHECK
Battery Remaining Longevity: 119 mo
Battery Remaining Percentage: 95 %
Battery Voltage: 3.04 V
Brady Statistic RV Percent Paced: 1 %
Date Time Interrogation Session: 20230530020144
HighPow Impedance: 65 Ohm
Implantable Pulse Generator Implant Date: 20230224
Lead Channel Impedance Value: 510 Ohm
Lead Channel Pacing Threshold Amplitude: 1 V
Lead Channel Pacing Threshold Pulse Width: 0.5 ms
Lead Channel Sensing Intrinsic Amplitude: 11.8 mV
Lead Channel Setting Pacing Amplitude: 2.5 V
Lead Channel Setting Pacing Pulse Width: 0.5 ms
Lead Channel Setting Sensing Sensitivity: 0.5 mV
Pulse Gen Serial Number: 111056600

## 2021-08-10 NOTE — Progress Notes (Signed)
Remote ICD transmission.   

## 2021-08-13 NOTE — Progress Notes (Unsigned)
Cardiology Office Note:    Date:  08/15/2021   ID:  [name] [name], DOB 1960/09/29, MRN [id number]  PCP:  Frederich Chick., MD  [facility] Providers Cardiologist:  Tonny Bollman, MD Cardiology APP:  Beatrice Lecher, PA-C  Electrophysiologist:  [name] Range, MD    Referring MD: Frederich Chick., MD   Chief Complaint:  Follow-up for CHF, CAD    Patient Profile: Ventricular tachycardia S/p AICD Tx with ATP in 08/2020 Major depressive disorder Obstructive sleep apnea HFrEF (heart failure with reduced ejection fraction) (HCC) 06/27/2010 Ischemic CM s/p ICD  Large anterior MI in 2008 tx with BMS to LAD  cMRI 6/10:  Ant, inf, septal, apical AK with full thickness scar, EF 23%  Echo 4/12: Inferoapical, apical, septal and dist ant AK, inferobasal HK, mild LVH, EF 25-30%, mild MR, mild LAE  Echocardiogram 8/22: EF 25-30, ant-sept, apical and ant-lat AK, Gr 1 DD, normal RVSF, mild RAE CAD (coronary artery disease)  Anterior STEMI in 2008 s/p 2.75 x 28 mm Vision BMS to LAD Hyperlipidemia  Essential hypertension  GERD    History of Present Illness:   [name] [name] is a 61 y.o. male with the above problem list.  He was last seen in January 2023.  He returns for follow-up.  He is here alone.  Overall, he is doing well.  He has not had chest pain, shortness of breath, syncope, orthopnea, leg edema.  He has not had any ICD discharges.  His granddaughter is graduating high school next year and is looking at going to [place].  He may decide to go with her but has not decided yet.        Past Medical History:  Diagnosis Date   CAD (coronary artery disease)    anterior MI 2008, treated with bare metal stent to LAD   Chronic systolic heart failure (HCC) 06/27/2010   Ischemic CM s/p ICD // Large anterior MI in 2008 tx with BMS to LAD // cMRI 6/10:  Ant, inf, septal, apical AK with full thickness scar, EF 23% // Echo 4/12: Inferoapical, apical, septal and dist ant AK, inferobasal  HK, mild LVH, EF 25-30%, mild MR, mild LAE    GERD (gastroesophageal reflux disease)    HLD (hyperlipidemia)    HTN (hypertension)    Ischemic cardiomyopathy    NYHA Class II/III CHF   MI (myocardial infarction) (HCC)    Pericarditis; 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   Umbilical hernia    Ventricular tachycardia (HCC) 07/29/2018   received appropriate ATP therapy for VT at 218 bp with syncope   Current Medications: Current Meds  Medication Sig   ARIPiprazole (ABILIFY) 10 MG tablet Take 10 mg by mouth daily as needed (for ptsd).   aspirin 81 MG tablet Take 81 mg by mouth daily.   carvedilol (COREG) 25 MG tablet Take 1 tablet (25 mg total) by mouth 2 (two) times daily with a meal.   cholecalciferol (VITAMIN D) 1000 UNITS tablet Take 1,000 Units by mouth daily.   doxycycline (VIBRAMYCIN) 100 MG capsule Take 1 capsule (100 mg total) by mouth 2 (two) times daily.   empagliflozin (JARDIANCE) 25 MG TABS tablet Take 12.5 mg by mouth daily. From [place].  Take 1/2 tablet by mouth once a day   fish oil-omega-3 fatty acids 1000 MG capsule Take 1 g by mouth daily.    fluticasone (FLONASE) 50 MCG/ACT nasal spray Place 2 sprays into both nostrils as needed for allergies  or rhinitis.   loratadine (CLARITIN) 10 MG tablet Take 10 mg by mouth daily as needed for allergies.   Multiple Vitamin (MULTIVITAMIN) tablet Take 1 tablet by mouth daily.     omeprazole (PRILOSEC) 20 MG capsule Take 20 mg by mouth as needed (gerd/reflux).   PARoxetine (PAXIL) 40 MG tablet Take 40 mg by mouth daily as needed (for PTSD).   sacubitril-valsartan (ENTRESTO) 97-103 MG Take 1 tablet by mouth 2 (two) times daily.   simvastatin (ZOCOR) 80 MG tablet Take 40 mg by mouth at bedtime.   spironolactone (ALDACTONE) 25 MG tablet Take 1 tablet (25 mg total) by mouth daily.   vitamin C (ASCORBIC ACID) 500 MG tablet Take 500 mg by mouth daily.   [DISCONTINUED] nitroGLYCERIN (NITROSTAT) 0.4 MG SL tablet Place 1 tablet (0.4 mg total) under the  tongue every 5 (five) minutes x 3 doses as needed for chest pain.    Allergies:   Patient has no known allergies.   Social History   Tobacco Use   Smoking status: Former    Types: Cigars    Quit date: 03/04/2006    Years since quitting: 15.4   Smokeless tobacco: Never  Vaping Use   Vaping Use: Never used  Substance Use Topics   Alcohol use: Yes    Comment: occasionally   Drug use: No    Family Hx: The patient's family history includes Diabetes in his mother and unknown relative; Heart Problems in his father; Heart failure in his unknown relative; Hypertension in his mother.  Review of Systems  Gastrointestinal:  Negative for hematochezia.  Genitourinary:  Negative for hematuria.     EKGs/Labs/Other Test Reviewed:    EKG:  EKG is not ordered today.  The ekg ordered today demonstrates n/a  Recent Labs: 10/11/2020: NT-Pro BNP 82 04/18/2021: BUN 16; Creatinine, Ser 1.02; Hemoglobin 16.2; Platelets 197; Potassium 4.5; Sodium 136   Labs personally reviewed and interpreted via Care Everywhere: 07/16/2021-A1c 6.1, ALT 27, K+ 4.4, creatinine 0.94, HDL 41, LDL 70, total cholesterol 563, triglycerides 74, TSH 1.08, Hgb 15.2, platelets 166 K  Risk Assessment/Calculations/Metrics:              Physical Exam:    VS:  BP 118/72 (BP Location: Left Arm, Patient Position: Sitting, Cuff Size: Large)   Pulse 70   Ht 5\' 10"  (1.778 m)   Wt 237 lb 6.4 oz (107.7 kg)   SpO2 95%   BMI 34.06 kg/m     Wt Readings from Last 3 Encounters:  08/15/21 237 lb 6.4 oz (107.7 kg)  06/30/21 231 lb (104.8 kg)  04/27/21 235 lb (106.6 kg)    Constitutional:      Appearance: Healthy appearance. Not in distress.  Neck:     Vascular: No carotid bruit or JVR. JVD normal.  Pulmonary:     Effort: Pulmonary effort is normal.     Breath sounds: No wheezing. No rales.  Cardiovascular:     Normal rate. Regular rhythm. Normal S1. Normal S2.      Murmurs: There is no murmur.  Edema:    Peripheral edema  absent.  Abdominal:     Palpations: Abdomen is soft.  Skin:    General: Skin is warm and dry.  Neurological:     General: No focal deficit present.     Mental Status: Alert and oriented to person, place and time.         ASSESSMENT & PLAN:   CAD (coronary artery disease) Status post  anterior STEMI in 2018 with a BMS to the LAD.  He is doing well without anginal symptoms.  Continue aspirin 81 mg daily, carvedilol 25 mg twice daily, simvastatin 40 mg daily.  HFrEF (heart failure with reduced ejection fraction) (HCC) EF 25-30.  Ischemic cardiomyopathy.  NYHA II.  Volume status stable.  He is on max GDMT.  Continue carvedilol 25 mg twice daily, Entresto 97/103 mg twice daily, spironolactone 25 mg daily, Jardiance 12.5 mg daily.  Essential hypertension The patient's blood pressure is controlled on his current regimen.  Continue current therapy.   Hyperlipidemia LDL goal <70 LDL optimal.  Continue simvastatin 40 mg daily.  Implantable cardioverter-defibrillator (ICD) in situ He notes some prominence of his device after recent generator replacement.  He sees Dr. Johney Frame later this week.  He has not had any redness or fever.          Dispo:  Return in about 6 months (around 02/14/2022) for Routine follow up ini 6 months with Dr. Excell Seltzer.   Medication Adjustments/Labs and Tests Ordered: Current medicines are reviewed at length with the patient today.  Concerns regarding medicines are outlined above.  Tests Ordered: No orders of the defined types were placed in this encounter.  Medication Changes: Meds ordered this encounter  Medications   nitroGLYCERIN (NITROSTAT) 0.4 MG SL tablet    Sig: Place 1 tablet (0.4 mg total) under the tongue every 5 (five) minutes x 3 doses as needed for chest pain.    Dispense:  25 tablet    Refill:  1   Signed, Tereso Newcomer, PA-C  08/15/2021 8:23 AM    [facility] [address] [place], [place], [place]  [address] Phone: [phone];  Fax: [phone]

## 2021-08-15 ENCOUNTER — Ambulatory Visit (INDEPENDENT_AMBULATORY_CARE_PROVIDER_SITE_OTHER): Payer: Self-pay | Admitting: Physician Assistant

## 2021-08-15 ENCOUNTER — Encounter: Payer: Self-pay | Admitting: Physician Assistant

## 2021-08-15 VITALS — BP 118/72 | HR 70 | Ht 70.0 in | Wt 237.4 lb

## 2021-08-15 DIAGNOSIS — Z95810 Presence of automatic (implantable) cardiac defibrillator: Secondary | ICD-10-CM

## 2021-08-15 DIAGNOSIS — I10 Essential (primary) hypertension: Secondary | ICD-10-CM

## 2021-08-15 DIAGNOSIS — I2510 Atherosclerotic heart disease of native coronary artery without angina pectoris: Secondary | ICD-10-CM

## 2021-08-15 DIAGNOSIS — I5020 Unspecified systolic (congestive) heart failure: Secondary | ICD-10-CM

## 2021-08-15 DIAGNOSIS — E785 Hyperlipidemia, unspecified: Secondary | ICD-10-CM

## 2021-08-15 MED ORDER — NITROGLYCERIN 0.4 MG SL SUBL: 0.4000 mg | SUBLINGUAL_TABLET | SUBLINGUAL | 1 refills | Status: AC | PRN

## 2021-08-15 NOTE — Assessment & Plan Note (Signed)
He notes some prominence of his device after recent generator replacement.  He sees Dr. Johney Frame later this week.  He has not had any redness or fever.

## 2021-08-15 NOTE — Assessment & Plan Note (Signed)
EF 25-30.  Ischemic cardiomyopathy.  NYHA II.  Volume status stable.  He is on max GDMT.  Continue carvedilol 25 mg twice daily, Entresto 97/103 mg twice daily, spironolactone 25 mg daily, Jardiance 12.5 mg daily.

## 2021-08-15 NOTE — Patient Instructions (Signed)
Medication Instructions:   Your physician recommends that you continue on your current medications as directed. Please refer to the Current Medication list given to you today.   *If you need a refill on your cardiac medications before your next appointment, please call your pharmacy*   Lab Work:  None ordered.   If you have labs (blood work) drawn today and your tests are completely normal, you will receive your results only by: [place] (if you have MyChart) OR A paper copy in the mail If you have any lab test that is abnormal or we need to change your treatment, we will call you to review the results.   Testing/Procedures:  None ordered.    Follow-Up: At [facility], you and your health needs are our priority.  As part of our continuing mission to provide you with exceptional heart care, we have created designated Provider Care Teams.  These Care Teams include your primary Cardiologist (physician) and Advanced Practice Providers (APPs -  Physician Assistants and Nurse Practitioners) who all work together to provide you with the care you need, when you need it.  We recommend signing up for the patient portal called "MyChart".  Sign up information is provided on this After Visit Summary.  MyChart is used to connect with patients for Virtual Visits (Telemedicine).  Patients are able to view lab/test results, encounter notes, upcoming appointments, etc.  Non-urgent messages can be sent to your provider as well.   To learn more about what you can do with MyChart, go to [email or web address].    Your next appointment:   6 month(s)  The format for your next appointment:   In Person  Provider:   Sherren Mocha, MD     Important Information About Sugar

## 2021-08-15 NOTE — Assessment & Plan Note (Signed)
Status post anterior STEMI in 2018 with a BMS to the LAD.  He is doing well without anginal symptoms.  Continue aspirin 81 mg daily, carvedilol 25 mg twice daily, simvastatin 40 mg daily.

## 2021-08-15 NOTE — Assessment & Plan Note (Signed)
The patient's blood pressure is controlled on his current regimen.  Continue current therapy.  

## 2021-08-15 NOTE — Assessment & Plan Note (Signed)
LDL optimal.  Continue simvastatin 40 mg daily.

## 2021-08-17 ENCOUNTER — Encounter: Payer: Self-pay | Admitting: Internal Medicine

## 2021-08-17 ENCOUNTER — Ambulatory Visit (INDEPENDENT_AMBULATORY_CARE_PROVIDER_SITE_OTHER): Payer: Self-pay | Admitting: Internal Medicine

## 2021-08-17 VITALS — BP 110/68 | HR 68 | Ht 70.0 in | Wt 238.6 lb

## 2021-08-17 DIAGNOSIS — I5022 Chronic systolic (congestive) heart failure: Secondary | ICD-10-CM

## 2021-08-17 DIAGNOSIS — I472 Ventricular tachycardia, unspecified: Secondary | ICD-10-CM

## 2021-08-17 NOTE — Patient Instructions (Addendum)
Medication Instructions:  Your physician recommends that you continue on your current medications as directed. Please refer to the Current Medication list given to you today. *If you need a refill on your cardiac medications before your next appointment, please call your pharmacy*  Lab Work: None ordered. If you have labs (blood work) drawn today and your tests are completely normal, you will receive your results only by: MyChart Message (if you have MyChart) OR A paper copy in the mail If you have any lab test that is abnormal or we need to change your treatment, we will call you to review the results.  Testing/Procedures: None ordered.  Follow-Up: At [facility], you and your health needs are our priority.  As part of our continuing mission to provide you with exceptional heart care, we have created designated Provider Care Teams.  These Care Teams include your primary Cardiologist (physician) and Advanced Practice Providers (APPs -  Physician Assistants and Nurse Practitioners) who all work together to provide you with the care you need, when you need it.  Your next appointment:    Your physician wants you to follow-up in: one year Casimiro Needle "Mardelle Matte" Tillery, PA-C You will receive a reminder letter in the mail two months in advance. If you don't receive a letter, please call our office to schedule the follow-up appointment.  Remote monitoring is used to monitor your ICD from home. This monitoring reduces the number of office visits required to check your device to one time per year. It allows [place] to keep an eye on the functioning of your device to ensure it is working properly. You are scheduled for a device check from home on 10/30/2021. You may send your transmission at any time that day. If you have a wireless device, the transmission will be sent automatically. After your physician reviews your transmission, you will receive a postcard with your next transmission date.   Important  Information About Sugar

## 2021-08-17 NOTE — Progress Notes (Signed)
PCP: Frederich Chick., MD Primary Cardiologist: Dr Excell Seltzer Primary EP: Dr Johney Frame  [name] [name] is a 61 y.o. male who presents today for routine electrophysiology followup.  Since his ICD generator change, the patient reports doing very well.  Today, he denies symptoms of palpitations, chest pain, shortness of breath,  lower extremity edema, dizziness, presyncope, syncope, or ICD shocks.  The patient is otherwise without complaint today.   Past Medical History:  Diagnosis Date   CAD (coronary artery disease)    anterior MI 2008, treated with bare metal stent to LAD   Chronic systolic heart failure (HCC) 06/27/2010   Ischemic CM s/p ICD // Large anterior MI in 2008 tx with BMS to LAD // cMRI 6/10:  Ant, inf, septal, apical AK with full thickness scar, EF 23% // Echo 4/12: Inferoapical, apical, septal and dist ant AK, inferobasal HK, mild LVH, EF 25-30%, mild MR, mild LAE    GERD (gastroesophageal reflux disease)    HLD (hyperlipidemia)    HTN (hypertension)    Ischemic cardiomyopathy    NYHA Class II/III CHF   MI (myocardial infarction) (HCC)    Pericarditis; 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   Umbilical hernia    Ventricular tachycardia (HCC) 07/29/2018   received appropriate ATP therapy for VT at 218 bp with syncope   Past Surgical History:  Procedure Laterality Date   CARDIAC DEFIBRILLATOR PLACEMENT  08/07/2010   SJM Fortify VR ST implanted by Dr Johney Frame, part of the Analyze ST ICD study   DEFIBRILLATOR FUNCTION TEST     FINGER SURGERY  04/05/2007   laceration repair   ICD GENERATOR CHANGEOUT N/A 04/27/2021   Procedure: ICD GENERATOR CHANGEOUT;  Surgeon: Hillis Range, MD;  Location: [facility] INVASIVE CV LAB;  Service: Cardiovascular;  Laterality: N/A;    ROS- all systems are reviewed and negative except as per HPI above  Current Outpatient Medications  Medication Sig Dispense Refill   ARIPiprazole (ABILIFY) 10 MG tablet Take 10 mg by mouth daily as needed (for ptsd).      aspirin 81 MG tablet Take 81 mg by mouth daily.     carvedilol (COREG) 25 MG tablet Take 1 tablet (25 mg total) by mouth 2 (two) times daily with a meal. 60 tablet 0   cholecalciferol (VITAMIN D) 1000 UNITS tablet Take 1,000 Units by mouth daily.     doxycycline (VIBRAMYCIN) 100 MG capsule Take 1 capsule (100 mg total) by mouth 2 (two) times daily. 14 capsule 0   empagliflozin (JARDIANCE) 25 MG TABS tablet Take 12.5 mg by mouth daily. From [place].  Take 1/2 tablet by mouth once a day     fish oil-omega-3 fatty acids 1000 MG capsule Take 1 g by mouth daily.      fluticasone (FLONASE) 50 MCG/ACT nasal spray Place 2 sprays into both nostrils as needed for allergies or rhinitis.     loratadine (CLARITIN) 10 MG tablet Take 10 mg by mouth daily as needed for allergies.     Multiple Vitamin (MULTIVITAMIN) tablet Take 1 tablet by mouth daily.       nitroGLYCERIN (NITROSTAT) 0.4 MG SL tablet Place 1 tablet (0.4 mg total) under the tongue every 5 (five) minutes x 3 doses as needed for chest pain. 25 tablet 1   omeprazole (PRILOSEC) 20 MG capsule Take 20 mg by mouth as needed (gerd/reflux).     PARoxetine (PAXIL) 40 MG tablet Take 40 mg by mouth daily as needed (for PTSD).  sacubitril-valsartan (ENTRESTO) 97-103 MG Take 1 tablet by mouth 2 (two) times daily. 180 tablet 3   simvastatin (ZOCOR) 80 MG tablet Take 40 mg by mouth at bedtime.     spironolactone (ALDACTONE) 25 MG tablet Take 1 tablet (25 mg total) by mouth daily. 90 tablet 3   vitamin C (ASCORBIC ACID) 500 MG tablet Take 500 mg by mouth daily.     No current facility-administered medications for this visit.    Physical Exam: Vitals:   08/17/21 1406  BP: 110/68  Pulse: 68  Weight: 238 lb 9.6 oz (108.2 kg)  Height: 5\' 10"  (1.778 m)    GEN- The patient is well appearing, alert and oriented x 3 today.   Head- normocephalic, atraumatic Eyes-  Sclera clear, conjunctiva pink Ears- hearing intact Oropharynx- clear Lungs- Clear to ausculation  bilaterally, normal work of breathing Chest- ICD pocket is well healed Heart- Regular rate and rhythm, no murmurs, rubs or gallops, PMI not laterally displaced GI- soft, NT, ND, + BS Extremities- no clubbing, cyanosis, or edema  ICD interrogation- reviewed in detail today,  See PACEART report  ekg tracing ordered today is personally reviewed and shows sinus rhythm 68 bpm, PR 174 msec, QRS 92 msec  Wt Readings from Last 3 Encounters:  08/17/21 238 lb 9.6 oz (108.2 kg)  08/15/21 237 lb 6.4 oz (107.7 kg)  06/30/21 231 lb (104.8 kg)    Assessment and Plan:  1.  Chronic systolic dysfunction/ CAD/ prior VT euvolemic today No ischemic symptoms He has previously received appropriate therapy Stable on an appropriate medical regimen Normal ICD function See Pace Art report No changes today he is not device dependant today followed in ICM device clinic  2. HTN Stable No change required today  Return to see EP APP in a year  07/02/21 MD, [facility] 08/17/2021 2:18 PM

## 2021-08-28 ENCOUNTER — Telehealth: Payer: Self-pay

## 2021-08-28 ENCOUNTER — Ambulatory Visit (INDEPENDENT_AMBULATORY_CARE_PROVIDER_SITE_OTHER): Payer: Self-pay

## 2021-08-28 DIAGNOSIS — I5022 Chronic systolic (congestive) heart failure: Secondary | ICD-10-CM

## 2021-08-28 DIAGNOSIS — Z95810 Presence of automatic (implantable) cardiac defibrillator: Secondary | ICD-10-CM

## 2021-09-05 ENCOUNTER — Ambulatory Visit (INDEPENDENT_AMBULATORY_CARE_PROVIDER_SITE_OTHER): Payer: Self-pay

## 2021-09-05 DIAGNOSIS — I5022 Chronic systolic (congestive) heart failure: Secondary | ICD-10-CM

## 2021-09-05 DIAGNOSIS — Z95810 Presence of automatic (implantable) cardiac defibrillator: Secondary | ICD-10-CM

## 2021-09-05 NOTE — Progress Notes (Signed)
EPIC Encounter for ICM Monitoring  Patient Name: [name] [name] is a 61 y.o. male Date: 09/05/2021 Primary Care Physican: Frederich Chick., MD Primary Cardiologist: Excell Seltzer    Electrophysiologist: Allred 06/20/2021 Weight: 233 lbs   07/25/2021 Weight: 231 lbs                                                 Transmission reviewed.    CorVue thoracic impedance suggesting fluid levels returned to normal.    Prescribed:  Spironolactone 25 mg take 1 tablet daily   Recommendations: No changes.   Follow-up plan: ICM clinic phone appointment on 10/08/2021.   91 day device clinic remote transmission 10/30/2021.     EP/Cardiology Office Visits:  02/12/2022 with Dr Excell Seltzer.  Recall 08/04/2022 with Otilio Saber, PA.  Discussed getting [facility] referral.     Copy of ICM check sent to Dr. Johney Frame.    3 month ICM trend: 09/05/2021.    12-14 Month ICM trend:     Karie Soda, RN 09/05/2021 3:57 PM

## 2021-10-08 ENCOUNTER — Ambulatory Visit (INDEPENDENT_AMBULATORY_CARE_PROVIDER_SITE_OTHER): Payer: Self-pay

## 2021-10-08 DIAGNOSIS — I5022 Chronic systolic (congestive) heart failure: Secondary | ICD-10-CM

## 2021-10-08 DIAGNOSIS — Z95810 Presence of automatic (implantable) cardiac defibrillator: Secondary | ICD-10-CM

## 2021-10-10 NOTE — Progress Notes (Signed)
EPIC Encounter for ICM Monitoring  Patient Name: [name] [name] is a 61 y.o. male Date: 10/10/2021 Primary Care Physican: Frederich Chick., MD Primary Cardiologist: Excell Seltzer    Electrophysiologist: Allred 06/20/2021 Weight: 233 lbs   07/25/2021 Weight: 231 lbs     10/10/2021 Weight: 231 lbs                                             Spoke with patient and heart failure questions reviewed.  Pt asymptomatic for fluid accumulation.  Reports feeling well at this time and voices no complaints. He may have drank more than 64 oz fluid during decreased impedance.    CorVue thoracic impedance normal but was suggesting possible fluid accumulation from 7/15-7/18 and 7/29-8/2.    Prescribed:  Spironolactone 25 mg take 1 tablet daily   Recommendations: No changes and encouraged to call if experiencing any fluid symptoms.   Follow-up plan: ICM clinic phone appointment on 11/12/2021.   91 day device clinic remote transmission 10/30/2021.     EP/Cardiology Office Visits:  02/12/2022 with Dr Excell Seltzer.  Recall 08/04/2022 with Otilio Saber, PA.  Discussed getting [facility] referral.     Copy of ICM check sent to Dr. Johney Frame.    3 month ICM trend: 10/08/2021.    12-14 Month ICM trend:     Karie Soda, RN 10/10/2021 3:20 PM

## 2021-10-12 ENCOUNTER — Telehealth: Payer: Self-pay | Admitting: Licensed Clinical Social Worker

## 2021-10-12 NOTE — Progress Notes (Unsigned)
Heart and Vascular Care Navigation  10/12/2021  [name] [name] November 14, 1960 [id number]  Reason for Referral: no noted insurance/SDOH Screening Patient is participating in a Managed Medicaid Plan: No, [facility] benefits only  Engaged with patient by telephone for initial visit for Heart and Vascular Care Coordination.                                                                                                   Assessment:       LCSW was able to reach pt this afternoon via telephone at [phone]. Introduced self, role, reason for call. Pt confirmed home address where he lives with his wife, he sees [place] [facility] [place] for his primary care and all his medications- able to find his card in Media tab to confirm he is service connected. Pt denies any current challenges, but we discussed that should he receive bills from [facility] that are not covered by the [place] that he may be eligible for [facility] Assistance. Pt agreeable to this being sent to his home address and understands I remain available. No additional questions/concerns at this time.                                HRT/VAS Care Coordination     Patients Home Cardiology Office [facility]   Outpatient Care Team Social Worker   Social Worker Name: Nile Riggs, [place] [facility] [phone]   Living arrangements for the past 2 months Single Family Home   Lives with: Spouse   Patient Current Insurance Coverage [facility]   Patient Has Concern With Paying Medical Bills No   Does Patient Have Prescription Coverage? Yes       Social History:                                                                             SDOH Screenings   Alcohol Screen: Not on file  Depression (TGG2-6): Not on file  Financial Resource Strain: Low Risk  (10/12/2021)   Overall Financial Resource Strain (CARDIA)    Difficulty of Paying Living Expenses: Not very hard  Food Insecurity: No Food Insecurity (10/12/2021)   Hunger Vital Sign    Worried  About Running Out of Food in the Last Year: Never true    Ran Out of Food in the Last Year: Never true  Housing: Low Risk  (10/12/2021)   Housing    Last Housing Risk Score: 0  Physical Activity: Not on file  Social Connections: Not on file  Stress: Not on file  Tobacco Use: Medium Risk (08/17/2021)   Patient History    Smoking Tobacco Use: Former    Smokeless Tobacco Use: Never    Passive Exposure: Not on file  Transportation Needs:  No Transportation Needs (10/12/2021)   PRAPARE - Administrator, Civil Service (Medical): No    Lack of Transportation (Non-Medical): No    SDOH Interventions: Financial Resources:  Corporate treasurer Interventions: Intervention Not Indicated   Food Insecurity:  Food Insecurity Interventions: Intervention Not Indicated  Housing Insecurity:   Housing Interventions: Intervention Not Indicated  Transportation:   Transportation Interventions: Intervention Not Indicated     Other Care Navigation Interventions:     Provided Pharmacy assistance resources  Gets medications through [place]   Follow-up plan:   LCSW mailed copy of [employer] Assistance started for pt and my card for any additional questions/concerns. I remain available.

## 2021-10-30 ENCOUNTER — Ambulatory Visit (INDEPENDENT_AMBULATORY_CARE_PROVIDER_SITE_OTHER): Payer: Self-pay

## 2021-10-30 DIAGNOSIS — I255 Ischemic cardiomyopathy: Secondary | ICD-10-CM

## 2021-10-30 LAB — CUP PACEART REMOTE DEVICE CHECK
Battery Remaining Longevity: 116 mo
Battery Remaining Percentage: 94 %
Battery Voltage: 3.02 V
Brady Statistic RV Percent Paced: 1 %
Date Time Interrogation Session: 20230829024549
HighPow Impedance: 66 Ohm
Implantable Pulse Generator Implant Date: 20230224
Lead Channel Impedance Value: 500 Ohm
Lead Channel Pacing Threshold Amplitude: 0.75 V
Lead Channel Pacing Threshold Pulse Width: 0.5 ms
Lead Channel Sensing Intrinsic Amplitude: 11.8 mV
Lead Channel Setting Pacing Amplitude: 2.5 V
Lead Channel Setting Pacing Pulse Width: 0.5 ms
Lead Channel Setting Sensing Sensitivity: 0.5 mV
Pulse Gen Serial Number: 111056600

## 2021-11-12 ENCOUNTER — Ambulatory Visit (INDEPENDENT_AMBULATORY_CARE_PROVIDER_SITE_OTHER): Payer: Self-pay

## 2021-11-12 DIAGNOSIS — Z95810 Presence of automatic (implantable) cardiac defibrillator: Secondary | ICD-10-CM

## 2021-11-12 DIAGNOSIS — I5022 Chronic systolic (congestive) heart failure: Secondary | ICD-10-CM

## 2021-11-12 NOTE — Progress Notes (Signed)
EPIC Encounter for ICM Monitoring  Patient Name: [name] [name] is a 61 y.o. male Date: 11/12/2021 Primary Care Physican: Frederich Chick., MD Primary Cardiologist: Excell Seltzer    Electrophysiologist: Elberta Fortis 06/20/2021 Weight: 233 lbs   07/25/2021 Weight: 231 lbs     10/10/2021 Weight: 231 lbs      11/12/2021 Weight: 231 lbs                                        Spoke with patient and heart failure questions reviewed.  Pt asymptomatic for fluid accumulation.  Pt reports drinking a lot of extra fluids because he has been outside in the heat and was feeling dehydrated.  He ran out of jardiance but is back on track with taking as prescribed.    CorVue thoracic impedance suggesting possible dryness from 9/1-9/6 followed by possible fluid accumulation starting 9/7.    Prescribed:  Spironolactone 25 mg take 1 tablet daily   Recommendations:   Recommendation to limit fluid intake to 64 oz daily.  Encouraged to call if experiencing any fluid symptoms. .   Follow-up plan: ICM clinic phone appointment on 11/19/2021 to recheck fluid levels.   91 day device clinic remote transmission 01/29/2022.     EP/Cardiology Office Visits:  02/12/2022 with Dr Excell Seltzer.  Recall 08/04/2022 with Otilio Saber, PA.    Copy of ICM check sent to Dr. Elberta Fortis and Dr Excell Seltzer for review and recommendations if needed.    3 month ICM trend: 11/12/2021.    12-14 Month ICM trend:     Karie Soda, RN 11/12/2021 7:58 AM

## 2021-11-19 ENCOUNTER — Ambulatory Visit (INDEPENDENT_AMBULATORY_CARE_PROVIDER_SITE_OTHER): Payer: Self-pay

## 2021-11-19 DIAGNOSIS — Z95810 Presence of automatic (implantable) cardiac defibrillator: Secondary | ICD-10-CM

## 2021-11-19 DIAGNOSIS — I5022 Chronic systolic (congestive) heart failure: Secondary | ICD-10-CM

## 2021-11-19 NOTE — Progress Notes (Unsigned)
EPIC Encounter for ICM Monitoring  Patient Name: [name] [name] is a 61 y.o. male Date: 11/19/2021 Primary Care Physican: Sherald Hess., MD Primary Cardiologist: Burt Knack    Electrophysiologist: Curt Bears 06/20/2021 Weight: 233 lbs   07/25/2021 Weight: 231 lbs     10/10/2021 Weight: 231 lbs      11/12/2021 Weight: 231 lbs                                        Spoke with patient and heart failure questions reviewed.  Pt asymptomatic for fluid accumulation.  He reports drinking a lot of fluids and discussed the importance of limiting fluid intake.  Hey will work on limiting fluids.    CorVue thoracic impedance suggesting possible fluid accumulation starting 9/7.    Prescribed:  Spironolactone 25 mg take 1 tablet daily   Recommendations:   Recommendation to limit fluid intake to 64 oz daily.  Encouraged to call if experiencing any fluid symptoms.    Follow-up plan: ICM clinic phone appointment on 11/26/2021 to recheck fluid levels.   91 day device clinic remote transmission 01/29/2022.     EP/Cardiology Office Visits:  02/12/2022 with Dr Burt Knack.  Recall 08/04/2022 with Oda Kilts, PA.    Copy of ICM check sent to Dr. Curt Bears and Dr Burt Knack for review and recommendations if needed.    3 month ICM trend: 11/19/2021.    12-14 Month ICM trend:     Rosalene Billings, RN 11/19/2021 8:37 AM

## 2021-11-23 NOTE — Progress Notes (Signed)
Remote ICD transmission.   

## 2021-11-26 ENCOUNTER — Ambulatory Visit (INDEPENDENT_AMBULATORY_CARE_PROVIDER_SITE_OTHER): Payer: Self-pay

## 2021-11-28 ENCOUNTER — Telehealth: Payer: Self-pay

## 2021-11-28 NOTE — Telephone Encounter (Signed)
I told the patient that if I do not see his missed ICM transmission by tomorrow, I will give him a call.

## 2021-11-28 NOTE — Telephone Encounter (Signed)
Missed ICM transmission received 11/28/2021.

## 2021-11-29 NOTE — Progress Notes (Signed)
EPIC Encounter for ICM Monitoring  Patient Name: [name] [name] is a 61 y.o. male Date: 11/29/2021 Primary Care Physican: Sherald Hess., MD Primary Cardiologist: Burt Knack    Electrophysiologist: Curt Bears 06/20/2021 Weight: 233 lbs   07/25/2021 Weight: 231 lbs     10/10/2021 Weight: 231 lbs      11/12/2021 Weight: 231 lbs      11/29/2021 Weight: 230 lbs                                   Spoke with patient and heart failure questions reviewed.  Pt asymptomatic for fluid accumulation. He did cut back < 64 oz fluid which correlates with possible dryness episode.   CorVue thoracic impedance suggesting possible fluid accumulation from 9/7-9/19 followed by possible dryness 9/20-9/26 and then back to normal 9/27.    Prescribed:  Spironolactone 25 mg take 1 tablet daily   Recommendations:   Advised to be consistent with fluid intake around 64 oz daily to help balance fluid levels.   Encouraged to call if experiencing any fluid symptoms.    Follow-up plan: ICM clinic phone appointment on 12/24/2021.   91 day device clinic remote transmission 01/29/2022.     EP/Cardiology Office Visits:  02/12/2022 with Dr Burt Knack.  Recall 08/04/2022 with Oda Kilts, PA.    Copy of ICM check sent to Dr. Curt Bears.   3 month ICM trend: 11/28/2021.    12-14 Month ICM trend:     Rosalene Billings, RN 11/29/2021 12:25 PM

## 2021-12-17 ENCOUNTER — Other Ambulatory Visit: Payer: Self-pay

## 2021-12-17 MED ORDER — ENTRESTO 97-103 MG PO TABS: 1.0000 | ORAL_TABLET | Freq: Two times a day (BID) | ORAL | 2 refills | Status: AC

## 2021-12-17 NOTE — Telephone Encounter (Signed)
Pt's medication was sent to pt's pharmacy as requested. Confirmation received.  °

## 2021-12-24 ENCOUNTER — Ambulatory Visit (INDEPENDENT_AMBULATORY_CARE_PROVIDER_SITE_OTHER): Payer: Self-pay

## 2021-12-24 DIAGNOSIS — I5022 Chronic systolic (congestive) heart failure: Secondary | ICD-10-CM

## 2021-12-24 DIAGNOSIS — Z95810 Presence of automatic (implantable) cardiac defibrillator: Secondary | ICD-10-CM

## 2021-12-24 NOTE — Progress Notes (Signed)
EPIC Encounter for ICM Monitoring  Patient Name: [name] [name] is a 61 y.o. male Date: 12/24/2021 Primary Care Physican: Sherald Hess., MD Primary Cardiologist: Burt Knack    Electrophysiologist: Curt Bears 06/20/2021 Weight: 233 lbs   07/25/2021 Weight: 231 lbs     10/10/2021 Weight: 231 lbs      11/12/2021 Weight: 231 lbs                                        Spoke with patient and heart failure questions reviewed.  Transmission results reviewed.  Pt reports having some bloating in stomach but has resolved.  He has been eating restaurant foods a lot in the last week.      CorVue thoracic impedance suggesting possible fluid accumulation starting 10/16.    Prescribed:  Spironolactone 25 mg take 1 tablet daily   Recommendations:  Dicussed trying to avoid restaurant foods since they are high in salt.   Encouraged to call if experiencing any fluid symptoms.    Follow-up plan: ICM clinic phone appointment on 12/31/2021 to recheck fluid levels.   91 day device clinic remote transmission 01/29/2022.     EP/Cardiology Office Visits:  02/12/2022 with Dr Burt Knack.  Recall 08/04/2022 with Oda Kilts, PA.    Copy of ICM check sent to Dr. Curt Bears.   3 month ICM trend: 12/24/2021.    12-14 Month ICM trend:     Rosalene Billings, RN 12/24/2021 7:57 AM

## 2021-12-31 ENCOUNTER — Ambulatory Visit (INDEPENDENT_AMBULATORY_CARE_PROVIDER_SITE_OTHER): Payer: Self-pay

## 2021-12-31 DIAGNOSIS — Z95810 Presence of automatic (implantable) cardiac defibrillator: Secondary | ICD-10-CM

## 2021-12-31 DIAGNOSIS — I5022 Chronic systolic (congestive) heart failure: Secondary | ICD-10-CM

## 2021-12-31 NOTE — Progress Notes (Signed)
EPIC Encounter for ICM Monitoring  Patient Name: [name] [name] is a 61 y.o. male Date: 12/31/2021 Primary Care Physican: Sherald Hess., MD Primary Cardiologist: Burt Knack    Electrophysiologist: Curt Bears 06/20/2021 Weight: 233 lbs   07/25/2021 Weight: 231 lbs     10/10/2021 Weight: 231 lbs      11/12/2021 Weight: 231 lbs                                        Spoke with patient and heart failure questions reviewed.  Transmission results reviewed.  Pt reports he is feeling well and no fluid symptoms.   CorVue thoracic impedance suggesting fluid levels returned to normal.    Prescribed:  Spironolactone 25 mg take 1 tablet daily   Recommendations:     Encouraged to call if experiencing any fluid symptoms.    Follow-up plan: ICM clinic phone appointment on 02/04/2022.   91 day device clinic remote transmission 01/29/2022.     EP/Cardiology Office Visits:  02/12/2022 with Dr Burt Knack.  Recall 08/04/2022 with Oda Kilts, PA.    Copy of ICM check sent to Dr. Curt Bears.   3 month ICM trend: 12/31/2021.    12-14 Month ICM trend:     Rosalene Billings, RN 12/31/2021 10:34 AM

## 2022-01-29 ENCOUNTER — Ambulatory Visit (INDEPENDENT_AMBULATORY_CARE_PROVIDER_SITE_OTHER): Payer: Self-pay

## 2022-01-29 DIAGNOSIS — I255 Ischemic cardiomyopathy: Secondary | ICD-10-CM

## 2022-01-30 LAB — CUP PACEART REMOTE DEVICE CHECK
Battery Remaining Longevity: 113 mo
Battery Remaining Percentage: 91 %
Battery Voltage: 3.02 V
Brady Statistic RV Percent Paced: 1 %
Date Time Interrogation Session: 20231128020554
HighPow Impedance: 66 Ohm
Implantable Pulse Generator Implant Date: 20230224
Lead Channel Impedance Value: 500 Ohm
Lead Channel Pacing Threshold Amplitude: 0.75 V
Lead Channel Pacing Threshold Pulse Width: 0.5 ms
Lead Channel Sensing Intrinsic Amplitude: 11.2 mV
Lead Channel Setting Pacing Amplitude: 2.5 V
Lead Channel Setting Pacing Pulse Width: 0.5 ms
Lead Channel Setting Sensing Sensitivity: 0.5 mV
Pulse Gen Serial Number: 111056600
Zone Setting Status: 755011

## 2022-02-04 ENCOUNTER — Ambulatory Visit (INDEPENDENT_AMBULATORY_CARE_PROVIDER_SITE_OTHER): Payer: Self-pay

## 2022-02-04 DIAGNOSIS — Z95810 Presence of automatic (implantable) cardiac defibrillator: Secondary | ICD-10-CM

## 2022-02-04 DIAGNOSIS — I5022 Chronic systolic (congestive) heart failure: Secondary | ICD-10-CM

## 2022-02-08 NOTE — Progress Notes (Signed)
EPIC Encounter for ICM Monitoring  Patient Name: [name] [name] is a 61 y.o. male Date: 02/08/2022 Primary Care Physican: Frederich Chick., MD Primary Cardiologist: Excell Seltzer    Electrophysiologist: Elberta Fortis 06/20/2021 Weight: 233 lbs   07/25/2021 Weight: 231 lbs     10/10/2021 Weight: 231 lbs      11/12/2021 Weight: 231 lbs                                        Spoke with patient and heart failure questions reviewed.  Transmission results reviewed.  Pt reports he is feeling well and no fluid symptoms.   CorVue thoracic impedance suggesting normal fluid levels.    Prescribed:  Spironolactone 25 mg take 1 tablet daily   Recommendations:     Encouraged to call if experiencing any fluid symptoms.    Follow-up plan: ICM clinic phone appointment on 03/18/2022.   91 day device clinic remote transmission 04/30/2022.     EP/Cardiology Office Visits:  02/12/2022 with Dr Excell Seltzer.  Recall 08/04/2022 with Otilio Saber, PA.    Copy of ICM check sent to Dr. Elberta Fortis.    3 month ICM trend: 02/04/2022.    12-14 Month ICM trend:     Karie Soda, RN 02/08/2022 3:01 PM

## 2022-02-12 ENCOUNTER — Encounter: Payer: Self-pay | Admitting: Cardiovascular Disease

## 2022-02-12 ENCOUNTER — Ambulatory Visit: Payer: Self-pay | Attending: Cardiovascular Disease | Admitting: Cardiovascular Disease

## 2022-02-12 VITALS — BP 128/88 | HR 77 | Ht 70.0 in | Wt 246.2 lb

## 2022-02-12 DIAGNOSIS — I5022 Chronic systolic (congestive) heart failure: Secondary | ICD-10-CM

## 2022-02-12 DIAGNOSIS — I2510 Atherosclerotic heart disease of native coronary artery without angina pectoris: Secondary | ICD-10-CM

## 2022-02-12 DIAGNOSIS — Z95810 Presence of automatic (implantable) cardiac defibrillator: Secondary | ICD-10-CM

## 2022-02-12 DIAGNOSIS — E785 Hyperlipidemia, unspecified: Secondary | ICD-10-CM

## 2022-02-12 DIAGNOSIS — I10 Essential (primary) hypertension: Secondary | ICD-10-CM

## 2022-02-12 NOTE — Patient Instructions (Signed)
Medication Instructions:  STOP Jardiance *If you need a refill on your cardiac medications before your next appointment, please call your pharmacy*   Lab Work: NONE If you have labs (blood work) drawn today and your tests are completely normal, you will receive your results only by: MyChart Message (if you have MyChart) OR A paper copy in the mail If you have any lab test that is abnormal or we need to change your treatment, we will call you to review the results.   Testing/Procedures: NONE   Follow-Up: At [facility], you and your health needs are our priority.  As part of our continuing mission to provide you with exceptional heart care, we have created designated Provider Care Teams.  These Care Teams include your primary Cardiologist (physician) and Advanced Practice Providers (APPs -  Physician Assistants and Nurse Practitioners) who all work together to provide you with the care you need, when you need it.  We recommend signing up for the patient portal called "MyChart".  Sign up information is provided on this After Visit Summary.  MyChart is used to connect with patients for Virtual Visits (Telemedicine).  Patients are able to view lab/test results, encounter notes, upcoming appointments, etc.  Non-urgent messages can be sent to your provider as well.   To learn more about what you can do with MyChart, go to [email or web address].    Your next appointment:   6 month(s)  The format for your next appointment:   In Person  Provider:   Jari Favre, PA-C, Ronie Spies, PA-C, Robin Searing, NP, Jacolyn Reedy, PA-C, Eligha Bridegroom, NP, or Tereso Newcomer, PA-C     Then, Tonny Bollman, MD will plan to see you again in 1 year(s).      Important Information About Sugar

## 2022-02-12 NOTE — Progress Notes (Unsigned)
Cardiology Office Note:    Date:  02/12/2022   ID:  [name] [name], DOB 31-Jan-1961, MRN [id number]  PCP:  Frederich Chick., MD   [facility] Providers Cardiologist:  Tonny Bollman, MD Cardiology APP:  Beatrice Lecher, PA-C  Electrophysiologist:  Hillis Range, MD     Referring MD: Frederich Chick., MD   Chief Complaint  Patient presents with   Congestive Heart Failure    History of Present Illness:    [name] [name] is a 61 y.o. male with a hx of: Ventricular tachycardia S/p AICD Tx with ATP in 08/2020 Major depressive disorder Obstructive sleep apnea HFrEF (heart failure with reduced ejection fraction) (HCC)           06/27/2010 Ischemic CM s/p ICD  Large anterior MI in 2008 tx with BMS to LAD  cMRI 6/10:  Ant, inf, septal, apical AK with full thickness scar, EF 23%  Echo 4/12: Inferoapical, apical, septal and dist ant AK, inferobasal HK, mild LVH, EF 25-30%, mild MR, mild LAE  Echocardiogram 8/22: EF 25-30, ant-sept, apical and ant-lat AK, Gr 1 DD, normal RVSF, mild RAE CAD (coronary artery disease)        Anterior STEMI in 2008 s/p 2.75 x 28 mm Vision BMS to LAD Hyperlipidemia       Essential hypertension        GERD           Labs were recently done through the [place] system.  Hemoglobin A1c is 6.1, Liver function tests are normal, potassium is 4.4, creatinine is 0.97, HDL cholesterol 41, LDL cholesterol 66, total cholesterol 121, triglycerides 72.  The patient is here alone today.  He has been getting along pretty well.  He has some shortness of breath during cool, damp weather.  States this is longstanding and unchanged.  No orthopnea, PND, leg swelling, or chest pain.  States that he is tolerating his medications well with 1 exception.  Since he has started [place] he is having problems with yeast infections.  He has used over-the-counter medication without success and continues to have problems with this.   Thanks a lot you to Past Medical  History:  Diagnosis Date   CAD (coronary artery disease)    anterior MI 2008, treated with bare metal stent to LAD   Chronic systolic heart failure (HCC) 06/27/2010   Ischemic CM s/p ICD // Large anterior MI in 2008 tx with BMS to LAD // cMRI 6/10:  Ant, inf, septal, apical AK with full thickness scar, EF 23% // Echo 4/12: Inferoapical, apical, septal and dist ant AK, inferobasal HK, mild LVH, EF 25-30%, mild MR, mild LAE    GERD (gastroesophageal reflux disease)    HLD (hyperlipidemia)    HTN (hypertension)    Ischemic cardiomyopathy    NYHA Class II/III CHF   MI (myocardial infarction) (HCC)    Pericarditis; 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   Umbilical hernia    Ventricular tachycardia (HCC) 07/29/2018   received appropriate ATP therapy for VT at 218 bp with syncope    Past Surgical History:  Procedure Laterality Date   CARDIAC DEFIBRILLATOR PLACEMENT  08/07/2010   SJM Fortify VR ST implanted by Dr Johney Frame, part of the Analyze ST ICD study   DEFIBRILLATOR FUNCTION TEST     FINGER SURGERY  04/05/2007   laceration repair   ICD GENERATOR CHANGEOUT N/A 04/27/2021   Procedure: ICD GENERATOR CHANGEOUT;  Surgeon: Hillis Range, MD;  Location:  [facility];  Service: Cardiovascular;  Laterality: N/A;    Current Medications: Current Meds  Medication Sig   ARIPiprazole (ABILIFY) 10 MG tablet Take 10 mg by mouth daily as needed (for ptsd).   aspirin 81 MG tablet Take 81 mg by mouth daily.   carvedilol (COREG) 25 MG tablet Take 1 tablet (25 mg total) by mouth 2 (two) times daily with a meal.   cholecalciferol (VITAMIN D) 1000 UNITS tablet Take 1,000 Units by mouth daily.   fish oil-omega-3 fatty acids 1000 MG capsule Take 1 g by mouth daily.    fluticasone (FLONASE) 50 MCG/ACT nasal spray Place 2 sprays into both nostrils as needed for allergies or rhinitis.   loratadine (CLARITIN) 10 MG tablet Take 10 mg by mouth daily as needed for allergies.   Multiple Vitamin (MULTIVITAMIN) tablet Take  1 tablet by mouth daily.     nitroGLYCERIN (NITROSTAT) 0.4 MG SL tablet Place 1 tablet (0.4 mg total) under the tongue every 5 (five) minutes x 3 doses as needed for chest pain.   omeprazole (PRILOSEC) 20 MG capsule Take 20 mg by mouth as needed (gerd/reflux).   PARoxetine (PAXIL) 40 MG tablet Take 40 mg by mouth daily as needed (for PTSD).   sacubitril-valsartan (ENTRESTO) 97-103 MG Take 1 tablet by mouth 2 (two) times daily.   simvastatin (ZOCOR) 80 MG tablet Take 40 mg by mouth at bedtime.   spironolactone (ALDACTONE) 25 MG tablet Take 1 tablet (25 mg total) by mouth daily.   vitamin C (ASCORBIC ACID) 500 MG tablet Take 500 mg by mouth daily.   [DISCONTINUED] empagliflozin (JARDIANCE) 25 MG TABS tablet Take 12.5 mg by mouth daily. From [place].  Take 1/2 tablet by mouth once a day     Allergies:   Patient has no known allergies.   Social History   Socioeconomic History   Marital status: Married    Spouse name: Not on file   Number of children: Not on file   Years of education: Not on file   Highest education level: Not on file  Occupational History   Occupation: truck driver  Tobacco Use   Smoking status: Former    Types: Cigars    Quit date: 03/04/2006    Years since quitting: 15.9   Smokeless tobacco: Never  Vaping Use   Vaping Use: Never used  Substance and Sexual Activity   Alcohol use: Yes    Comment: occasionally   Drug use: No   Sexual activity: Not on file  Other Topics Concern   Not on file  Social History Narrative   Lives in [place] with spouse.  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   Social Determinants of Health   Financial Resource Strain: Low Risk  (10/12/2021)   Overall Financial Resource Strain (CARDIA)    Difficulty of Paying Living Expenses: Not very hard  Food Insecurity: No Food Insecurity (10/12/2021)   Hunger Vital Sign    Worried About Running Out of Food in the Last Year: Never true     Ran Out of Food in the Last Year: Never true  Transportation Needs: No Transportation Needs (10/12/2021)   PRAPARE - Administrator, Civil Service (Medical): No    Lack of Transportation (Non-Medical): No  Physical Activity: Not on file  Stress: Not on file  Social Connections: Not on file     Family History: The patient's family history includes Diabetes in his  mother and unknown relative; Heart Problems in his father; Heart failure in his unknown relative; Hypertension in his mother.  ROS:   Please see the history of present illness.    All other systems reviewed and are negative.  EKGs/Labs/Other Studies Reviewed:    The following studies were reviewed today: Echo 10/31/2020:  1. Left ventricular ejection fraction, by estimation, is 25 to 30%. The  left ventricle has severely decreased function. The left ventricle  demonstrates regional wall motion abnormalities (see scoring  diagram/findings for description). Left ventricular  diastolic parameters are consistent with Grade I diastolic dysfunction  (impaired relaxation). There is severe akinesis of the left ventricular,  mid-apical anteroseptal wall, apical segment and anterolateral wall.   2. Right ventricular systolic function is normal. The right ventricular  size is normal. There is normal pulmonary artery systolic pressure.   3. Right atrial size was mildly dilated.   4. The mitral valve is grossly normal. No evidence of mitral valve  regurgitation.   5. The aortic valve is normal in structure. Aortic valve regurgitation is  not visualized. No aortic stenosis is present.    EKG:  EKG is not ordered today.    Recent Labs: 04/18/2021: BUN 16; Creatinine, Ser 1.02; Hemoglobin 16.2; Platelets 197; Potassium 4.5; Sodium 136  Recent Lipid Panel    Component Value Date/Time   CHOL 121 05/21/2010 1139   TRIG 95.0 05/21/2010 1139   HDL 34.60 (L) 05/21/2010 1139   CHOLHDL 3 05/21/2010 1139   VLDL 19.0 05/21/2010  1139   LDLCALC 67 05/21/2010 1139     Risk Assessment/Calculations:                Physical Exam:    VS:  BP 128/88 (BP Location: Right Arm, Patient Position: Sitting, Cuff Size: Large)   Pulse 77   Ht 5\' 10"  (1.778 m)   Wt 246 lb 3.2 oz (111.7 kg)   SpO2 90%   BMI 35.33 kg/m     Wt Readings from Last 3 Encounters:  02/12/22 246 lb 3.2 oz (111.7 kg)  08/17/21 238 lb 9.6 oz (108.2 kg)  08/15/21 237 lb 6.4 oz (107.7 kg)     GEN:  Well nourished, well developed in no acute distress HEENT: Normal NECK: No JVD; No carotid bruits LYMPHATICS: No lymphadenopathy CARDIAC: RRR, no murmurs, rubs, gallops RESPIRATORY:  Clear to auscultation without rales, wheezing or rhonchi  ABDOMEN: Soft, non-tender, non-distended MUSCULOSKELETAL:  No edema; No deformity  SKIN: Warm and dry NEUROLOGIC:  Alert and oriented x 3 PSYCHIATRIC:  Normal affect   ASSESSMENT:    1. Chronic systolic heart failure (HCC)   2. Implantable cardioverter-defibrillator (ICD) in situ   3. Coronary artery disease involving native coronary artery of native heart without angina pectoris   4. Essential hypertension   5. Hyperlipidemia LDL goal <70    PLAN:    In order of problems listed above:  The patient appears clinically stable on his current medical regimen with NYHA functional class II symptoms.  I have recommended that he discontinue Jardiance with his recurrent yeast infections.  He otherwise seems to be doing well and will maintain a regimen of carvedilol, Entresto, and spironolactone. No ICD discharges reported.  Followed regularly by the EP service. No anginal symptoms reported.  Treated with aspirin, carvedilol, and intensive lipid-lowering. Blood pressure is controlled on his current medical regimen.  Reviewed labs through the [place] system as outlined in the HPI. Lipids at goal based on recent labs.  Patient treated through the [place] system.  LDL cholesterol was 66.  We discussed lifestyle modification  at length with the focus on weight loss.  He has done the keto diet in the past and I cautioned him about this as a long-term strategy, but I think it might kick start him and it is okay to do in the short-term.  We reviewed calorie counting, moderation, use of more complex carbohydrates, etc.     Medication Adjustments/Labs and Tests Ordered: Current medicines are reviewed at length with the patient today.  Concerns regarding medicines are outlined above.  No orders of the defined types were placed in this encounter.  No orders of the defined types were placed in this encounter.   Patient Instructions  Medication Instructions:  STOP Jardiance *If you need a refill on your cardiac medications before your next appointment, please call your pharmacy*   Lab Work: NONE If you have labs (blood work) drawn today and your tests are completely normal, you will receive your results only by: MyChart Message (if you have MyChart) OR A paper copy in the mail If you have any lab test that is abnormal or we need to change your treatment, we will call you to review the results.   Testing/Procedures: NONE   Follow-Up: At [facility], you and your health needs are our priority.  As part of our continuing mission to provide you with exceptional heart care, we have created designated Provider Care Teams.  These Care Teams include your primary Cardiologist (physician) and Advanced Practice Providers (APPs -  Physician Assistants and Nurse Practitioners) who all work together to provide you with the care you need, when you need it.  We recommend signing up for the patient portal called "MyChart".  Sign up information is provided on this After Visit Summary.  MyChart is used to connect with patients for Virtual Visits (Telemedicine).  Patients are able to view lab/test results, encounter notes, upcoming appointments, etc.  Non-urgent messages can be sent to your provider as well.   To learn more  about what you can do with MyChart, go to [email or web address].    Your next appointment:   6 month(s)  The format for your next appointment:   In Person  Provider:   Jari Favre, PA-C, Ronie Spies, PA-C, Robin Searing, NP, Jacolyn Reedy, PA-C, Eligha Bridegroom, NP, or Tereso Newcomer, PA-C     Then, Tonny Bollman, MD will plan to see you again in 1 year(s).      Important Information About Sugar         Signed, Tonny Bollman, MD  02/12/2022 10:57 AM    [facility]

## 2022-03-01 NOTE — Progress Notes (Signed)
Remote ICD transmission.   

## 2022-03-13 ENCOUNTER — Other Ambulatory Visit: Payer: Self-pay | Admitting: Podiatry

## 2022-03-13 ENCOUNTER — Ambulatory Visit (INDEPENDENT_AMBULATORY_CARE_PROVIDER_SITE_OTHER): Payer: No Typology Code available for payment source | Admitting: Podiatry

## 2022-03-13 DIAGNOSIS — F329 Major depressive disorder, single episode, unspecified: Secondary | ICD-10-CM | POA: Insufficient documentation

## 2022-03-13 DIAGNOSIS — H04123 Dry eye syndrome of bilateral lacrimal glands: Secondary | ICD-10-CM | POA: Insufficient documentation

## 2022-03-13 DIAGNOSIS — M79674 Pain in right toe(s): Secondary | ICD-10-CM | POA: Insufficient documentation

## 2022-03-13 DIAGNOSIS — H527 Unspecified disorder of refraction: Secondary | ICD-10-CM | POA: Insufficient documentation

## 2022-03-13 DIAGNOSIS — E65 Localized adiposity: Secondary | ICD-10-CM | POA: Insufficient documentation

## 2022-03-13 DIAGNOSIS — Z79899 Other long term (current) drug therapy: Secondary | ICD-10-CM | POA: Diagnosis not present

## 2022-03-13 DIAGNOSIS — H3589 Other specified retinal disorders: Secondary | ICD-10-CM | POA: Insufficient documentation

## 2022-03-13 DIAGNOSIS — H40059 Ocular hypertension, unspecified eye: Secondary | ICD-10-CM | POA: Insufficient documentation

## 2022-03-13 DIAGNOSIS — H524 Presbyopia: Secondary | ICD-10-CM | POA: Insufficient documentation

## 2022-03-13 DIAGNOSIS — I429 Cardiomyopathy, unspecified: Secondary | ICD-10-CM | POA: Insufficient documentation

## 2022-03-13 DIAGNOSIS — R0981 Nasal congestion: Secondary | ICD-10-CM | POA: Insufficient documentation

## 2022-03-13 DIAGNOSIS — B351 Tinea unguium: Secondary | ICD-10-CM

## 2022-03-13 DIAGNOSIS — I5022 Chronic systolic (congestive) heart failure: Secondary | ICD-10-CM | POA: Insufficient documentation

## 2022-03-13 DIAGNOSIS — R030 Elevated blood-pressure reading, without diagnosis of hypertension: Secondary | ICD-10-CM | POA: Insufficient documentation

## 2022-03-13 NOTE — Progress Notes (Signed)
Subjective:  Patient ID: [name] [name], male    DOB: 1960/06/03,  MRN: [id number]  Chief Complaint  Patient presents with   Nail Problem    62 y.o. male presents with the above complaint.  Bilateral hallux thickened elongated dystrophic mycotic toenails x 2.  Patient states that it has been present for quite some time is progressive gotten worse.  He would like to discuss treatment options for it.  He is tried some over-the-counter medication which has helped.  He would like to discuss oral medication.  Denies any other acute issues.   Review of Systems: Negative except as noted in the HPI. Denies N/V/F/Ch.  Past Medical History:  Diagnosis Date   CAD (coronary artery disease)    anterior MI 2008, treated with bare metal stent to LAD   Chronic systolic heart failure ([place]) 06/27/2010   Ischemic CM s/p ICD // Large anterior MI in 2008 tx with BMS to LAD // cMRI 6/10:  Ant, inf, septal, apical AK with full thickness scar, EF 23% // Echo 4/12: Inferoapical, apical, septal and dist ant AK, inferobasal HK, mild LVH, EF 25-30%, mild MR, mild LAE    GERD (gastroesophageal reflux disease)    HLD (hyperlipidemia)    HTN (hypertension)    Ischemic cardiomyopathy    NYHA Class II/III CHF   MI (myocardial infarction) ([place])    Pericarditis; 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   Umbilical hernia    Ventricular tachycardia ([place]) 07/29/2018   received appropriate ATP therapy for VT at 218 bp with syncope    Current Outpatient Medications:    diclofenac Sodium (VOLTAREN) 1 % GEL, APPLY 2GM TOPICALLY 4 TIMES A DAY, Disp: , Rfl:    folic acid (FOLVITE) 1 MG tablet, TAKE 2 TABLETS BY MOUTH EVERY DAY ###, Disp: , Rfl:    ARIPiprazole (ABILIFY) 10 MG tablet, Take 10 mg by mouth daily as needed (for ptsd)., Disp: , Rfl:    aspirin 81 MG tablet, Take 81 mg by mouth daily., Disp: , Rfl:    carvedilol (COREG) 25 MG tablet, Take 1 tablet (25 mg total) by mouth 2 (two) times daily with a meal., Disp: 60 tablet,  Rfl: 0   cholecalciferol (VITAMIN D) 1000 UNITS tablet, Take 1,000 Units by mouth daily., Disp: , Rfl:    fish oil-omega-3 fatty acids 1000 MG capsule, Take 1 g by mouth daily. , Disp: , Rfl:    fluticasone (FLONASE) 50 MCG/ACT nasal spray, Place 2 sprays into both nostrils as needed for allergies or rhinitis., Disp: , Rfl:    loratadine (CLARITIN) 10 MG tablet, Take 10 mg by mouth daily as needed for allergies., Disp: , Rfl:    Multiple Vitamin (MULTIVITAMIN) tablet, Take 1 tablet by mouth daily.  , Disp: , Rfl:    nitroGLYCERIN (NITROSTAT) 0.4 MG SL tablet, Place 1 tablet (0.4 mg total) under the tongue every 5 (five) minutes x 3 doses as needed for chest pain., Disp: 25 tablet, Rfl: 1   omeprazole (PRILOSEC) 20 MG capsule, Take 20 mg by mouth as needed (gerd/reflux)., Disp: , Rfl:    PARoxetine (PAXIL) 40 MG tablet, Take 40 mg by mouth daily as needed (for PTSD)., Disp: , Rfl:    sacubitril-valsartan (ENTRESTO) 97-103 MG, Take 1 tablet by mouth 2 (two) times daily., Disp: 180 tablet, Rfl: 2   simvastatin (ZOCOR) 80 MG tablet, Take 40 mg by mouth at bedtime., Disp: , Rfl:    spironolactone (ALDACTONE) 25 MG tablet, Take 1  tablet (25 mg total) by mouth daily., Disp: 90 tablet, Rfl: 3   terbinafine (LAMISIL) 250 MG tablet, Take 1 tablet (250 mg total) by mouth daily., Disp: 90 tablet, Rfl: 0   vitamin C (ASCORBIC ACID) 500 MG tablet, Take 500 mg by mouth daily., Disp: , Rfl:   Social History   Tobacco Use  Smoking Status Former   Types: Cigars   Quit date: 03/04/2006   Years since quitting: 16.0  Smokeless Tobacco Never    No Known Allergies Objective:  There were no vitals filed for this visit. There is no height or weight on file to calculate BMI. Constitutional Well developed. Well nourished.  Vascular Dorsalis pedis pulses palpable bilaterally. Posterior tibial pulses palpable bilaterally. Capillary refill normal to all digits.  No cyanosis or clubbing noted. Pedal hair growth  normal.  Neurologic Normal speech. Oriented to person, place, and time. Epicritic sensation to light touch grossly present bilaterally.  Dermatologic Nails thickened and again dystrophic mycotic toenails x 2 Skin within normal limits  Orthopedic: Normal joint ROM without pain or crepitus bilaterally. No visible deformities. No bony tenderness.   Radiographs: None Assessment:   1. Long-term use of high-risk medication   2. Nail fungus   3. Onychomycosis due to dermatophyte    Plan:  Patient was evaluated and treated and all questions answered.  Bilateral hallux onychomycosis -Educated the patient on the etiology of onychomycosis and various treatment options associated with improving the fungal load.  I explained to the patient that there is 3 treatment options available to treat the onychomycosis including topical, p.o., laser treatment.  Patient elected to undergo p.o. options with Lamisil/terbinafine therapy.  In order for me to start the medication therapy, I explained to the patient the importance of evaluating the liver and obtaining the liver function test.  Once the liver function test comes back normal I will start him on 50-month course of Lamisil therapy.  Patient understood all risk and would like to proceed with Lamisil therapy.  I have asked the patient to immediately stop the Lamisil therapy if she has any reactions to it and call the office or go to the emergency room right away.  Patient states understanding   No follow-ups on file.

## 2022-03-14 LAB — HEPATIC FUNCTION PANEL
ALT: 23 IU/L (ref 0–44)
AST: 21 IU/L (ref 0–40)
Albumin: 4.1 g/dL (ref 3.9–4.9)
Alkaline Phosphatase: 59 IU/L (ref 44–121)
Bilirubin Total: 0.2 mg/dL (ref 0.0–1.2)
Bilirubin, Direct: <0.1 mg/dL (ref 0.00–0.40)
Total Protein: 6.9 g/dL (ref 6.0–8.5)

## 2022-03-14 MED ORDER — TERBINAFINE HCL 250 MG PO TABS: 250.0000 mg | ORAL_TABLET | Freq: Every day | ORAL | 0 refills | Status: DC

## 2022-03-14 NOTE — Addendum Note (Signed)
Addended by: Boneta Lucks on: 03/14/2022 08:13 AM   Modules accepted: Orders

## 2022-03-18 ENCOUNTER — Ambulatory Visit (INDEPENDENT_AMBULATORY_CARE_PROVIDER_SITE_OTHER): Payer: Self-pay

## 2022-03-18 DIAGNOSIS — Z95810 Presence of automatic (implantable) cardiac defibrillator: Secondary | ICD-10-CM

## 2022-03-18 DIAGNOSIS — I5022 Chronic systolic (congestive) heart failure: Secondary | ICD-10-CM

## 2022-03-21 NOTE — Progress Notes (Addendum)
EPIC Encounter for ICM Monitoring  Patient Name: [name] [name] is a 62 y.o. male Date: 03/21/2022 Primary Care Physican: Sherald Hess., MD Primary Cardiologist: Burt Knack    Electrophysiologist: Curt Bears 06/20/2021 Weight: 233 lbs   07/25/2021 Weight: 231 lbs     10/10/2021 Weight: 231 lbs      11/12/2021 Weight: 231 lbs                                        Spoke with patient and heart failure questions reviewed.  Transmission results reviewed.  Pt asymptomatic for fluid accumulation.  Reports feeling well at this time and voices no complaints.     CorVue thoracic impedance suggesting normal fluid levels with possible fluid accumulation from 12/9-12/21, 12/24-12/27, 1/6-1/10   Prescribed:  Spironolactone 25 mg take 1 tablet daily   Recommendations:     No changes and encouraged to call if experiencing any fluid symptoms.   Follow-up plan: ICM clinic phone appointment on 04/23/2022.   91 day device clinic remote transmission 04/30/2022.     EP/Cardiology Office Visits:  Recall 02/07/2023 with Dr Burt Knack.  Recall 08/22/2022 with Oda Kilts, PA.  Recall 08/22/2022 with Dr York Cerise APP.   Copy of ICM check sent to Dr. Curt Bears.   3 month ICM trend: 03/18/2022.    12-14 Month ICM trend:     Rosalene Billings, RN 03/21/2022 12:29 PM

## 2022-03-22 ENCOUNTER — Emergency Department (HOSPITAL_COMMUNITY)
Admission: EM | Admit: 2022-03-22 | Discharge: 2022-03-22 | Discharge status: Against Medical Advice | Payer: Self-pay | Source: Home / Self Care

## 2022-03-25 ENCOUNTER — Telehealth: Payer: Self-pay

## 2022-03-25 NOTE — Telephone Encounter (Signed)
Device alert for sustained VT with successful therapy Event occurred 1/19 @ 16:01, EGM shows abrupt onset of sustained VT, rate 218, ATP delivered x1 unsuccessful followed by 800V converting to VS-VP, duration 20sec Route to triage LA   APPT 03/26/22 at 3pm with Dr. Curt Bears.  Patient states that he was carrying wood into the house on Friday around 4pm.  He sat down and stated he felt a "pop" but didn't think anything about it.  Reports he did not lose consciousness and has felt fine since. Denies any SOB, dizziness, lightheadedness or chest pain. He was informed of [place] DMV driving law that he is not to drive for 57mths following ATP/shock therapy. Patient given appt tomorrow with Dr. Curt Bears and verbalizes understanding of all instructions given today. Including: he is to call 911 and go to ER if device shocks him again or he experiences that "pop" sensation before his appointment with Dr. Curt Bears tomorrow.

## 2022-03-25 NOTE — Progress Notes (Signed)
Spoke with patient and heart failure questions reviewed.  Transmission results reviewed.  Pt asymptomatic for fluid accumulation.  He had a device shock over the weekend and has follow up appointment with Dr Curt Bears tomorrow, 1/23.

## 2022-03-26 ENCOUNTER — Encounter: Payer: Self-pay | Admitting: *Deleted

## 2022-03-26 ENCOUNTER — Encounter: Payer: Self-pay | Admitting: Cardiology

## 2022-03-26 ENCOUNTER — Ambulatory Visit: Payer: Self-pay | Attending: Cardiology | Admitting: Cardiology

## 2022-03-26 VITALS — BP 114/72 | HR 70 | Ht 70.0 in | Wt 246.2 lb

## 2022-03-26 DIAGNOSIS — I472 Ventricular tachycardia, unspecified: Secondary | ICD-10-CM

## 2022-03-26 DIAGNOSIS — I2510 Atherosclerotic heart disease of native coronary artery without angina pectoris: Secondary | ICD-10-CM

## 2022-03-26 DIAGNOSIS — I5022 Chronic systolic (congestive) heart failure: Secondary | ICD-10-CM

## 2022-03-26 DIAGNOSIS — Z01812 Encounter for preprocedural laboratory examination: Secondary | ICD-10-CM

## 2022-03-26 LAB — CUP PACEART INCLINIC DEVICE CHECK
Battery Remaining Longevity: 112 mo
Brady Statistic RV Percent Paced: 1 % — CL
Date Time Interrogation Session: 20240123170941
HighPow Impedance: 65.25 Ohm
Implantable Pulse Generator Implant Date: 20230224
Lead Channel Impedance Value: 425 Ohm
Lead Channel Pacing Threshold Amplitude: 1 V
Lead Channel Pacing Threshold Amplitude: 1 V
Lead Channel Pacing Threshold Pulse Width: 0.5 ms
Lead Channel Pacing Threshold Pulse Width: 0.5 ms
Lead Channel Sensing Intrinsic Amplitude: 11.8 mV
Lead Channel Setting Pacing Amplitude: 2.5 V
Lead Channel Setting Pacing Pulse Width: 0.5 ms
Lead Channel Setting Sensing Sensitivity: 0.5 mV
Pulse Gen Serial Number: 111056600
Zone Setting Status: 755011

## 2022-03-26 NOTE — H&P (View-Only) (Signed)
Electrophysiology Office Note   Date:  03/26/2022   ID:  [name], [name] 1960/08/27, MRN [id number]  PCP:  Frederich Chick., MD  Cardiologist:  Excell Seltzer Primary Electrophysiologist:  Tahmid Stonehocker Jorja Loa, MD    Chief Complaint: ICD   History of Present Illness: [name] [name] is a 62 y.o. male who is being seen today for the evaluation of ICD at the request of Frederich Chick., MD. Presenting today for electrophysiology evaluation.  He has a history significant for coronary artery disease status post anterior MI in 2008, chronic systolic heart failure, hypertension, hyperlipidemia, ventricular tachycardia.  This past weekend, he was cutting wood.  He was working in the cold.  He was taking the wood back into his house and had a pop sensation.  Device interrogation shows that he received an ICD shock.  He did get ATP prior to the shock did not convert him from ventricular tachycardia.  He has not had chest pain or shortness of breath.  This is his first shock in the last few years.  Today, he denies symptoms of palpitations, chest pain, shortness of breath, orthopnea, PND, lower extremity edema, claudication, dizziness, presyncope, syncope, bleeding, or neurologic sequela. The patient is tolerating medications without difficulties.    Past Medical History:  Diagnosis Date   CAD (coronary artery disease)    anterior MI 2008, treated with bare metal stent to LAD   Chronic systolic heart failure (HCC) 06/27/2010   Ischemic CM s/p ICD // Large anterior MI in 2008 tx with BMS to LAD // cMRI 6/10:  Ant, inf, septal, apical AK with full thickness scar, EF 23% // Echo 4/12: Inferoapical, apical, septal and dist ant AK, inferobasal HK, mild LVH, EF 25-30%, mild MR, mild LAE    GERD (gastroesophageal reflux disease)    HLD (hyperlipidemia)    HTN (hypertension)    Ischemic cardiomyopathy    NYHA Class II/III CHF   MI (myocardial infarction) (HCC)    Pericarditis; post-MI     w/o recurrence   Umbilical hernia    Ventricular tachycardia (HCC) 07/29/2018   received appropriate ATP therapy for VT at 218 bp with syncope   Past Surgical History:  Procedure Laterality Date   CARDIAC DEFIBRILLATOR PLACEMENT  08/07/2010   SJM Fortify VR ST implanted by Dr Johney Frame, part of the Analyze ST ICD study   DEFIBRILLATOR FUNCTION TEST     FINGER SURGERY  04/05/2007   laceration repair   ICD GENERATOR CHANGEOUT N/A 04/27/2021   Procedure: ICD GENERATOR CHANGEOUT;  Surgeon: Hillis Range, MD;  Location: [facility] INVASIVE CV LAB;  Service: Cardiovascular;  Laterality: N/A;     Current Outpatient Medications  Medication Sig Dispense Refill   ARIPiprazole (ABILIFY) 10 MG tablet Take 10 mg by mouth daily as needed (for ptsd).     aspirin 81 MG tablet Take 81 mg by mouth daily.     carvedilol (COREG) 25 MG tablet Take 1 tablet (25 mg total) by mouth 2 (two) times daily with a meal. 60 tablet 0   cholecalciferol (VITAMIN D) 1000 UNITS tablet Take 1,000 Units by mouth daily.     diclofenac Sodium (VOLTAREN) 1 % GEL APPLY 2GM TOPICALLY 4 TIMES A DAY     fish oil-omega-3 fatty acids 1000 MG capsule Take 1 g by mouth daily.      fluticasone (FLONASE) 50 MCG/ACT nasal spray Place 2 sprays into both nostrils as needed for allergies or rhinitis.  folic acid (FOLVITE) 1 MG tablet TAKE 2 TABLETS BY MOUTH EVERY DAY ###     loratadine (CLARITIN) 10 MG tablet Take 10 mg by mouth daily as needed for allergies.     Multiple Vitamin (MULTIVITAMIN) tablet Take 1 tablet by mouth daily.       nitroGLYCERIN (NITROSTAT) 0.4 MG SL tablet Place 1 tablet (0.4 mg total) under the tongue every 5 (five) minutes x 3 doses as needed for chest pain. 25 tablet 1   omeprazole (PRILOSEC) 20 MG capsule Take 20 mg by mouth as needed (gerd/reflux).     PARoxetine (PAXIL) 40 MG tablet Take 40 mg by mouth daily as needed (for PTSD).     sacubitril-valsartan (ENTRESTO) 97-103 MG Take 1 tablet by mouth 2 (two) times daily.  180 tablet 2   simvastatin (ZOCOR) 80 MG tablet Take 40 mg by mouth at bedtime.     spironolactone (ALDACTONE) 25 MG tablet Take 1 tablet (25 mg total) by mouth daily. 90 tablet 3   terbinafine (LAMISIL) 250 MG tablet Take 1 tablet (250 mg total) by mouth daily. 90 tablet 0   vitamin C (ASCORBIC ACID) 500 MG tablet Take 500 mg by mouth daily.     No current facility-administered medications for this visit.    Allergies:   Patient has no known allergies.   Social History:  The patient  reports that he quit smoking about 16 years ago. His smoking use included cigars. He has never used smokeless tobacco. He reports current alcohol use. He reports that he does not use drugs.   Family History:  The patient's family history includes Diabetes in his mother and unknown relative; Heart Problems in his father; Heart failure in his unknown relative; Hypertension in his mother.    ROS:  Please see the history of present illness.   Otherwise, review of systems is positive for none.   All other systems are reviewed and negative.    PHYSICAL EXAM: VS:  BP 114/72   Pulse 70   Ht 5\' 10"  (1.778 m)   Wt 246 lb 3.2 oz (111.7 kg)   SpO2 94%   BMI 35.33 kg/m  , BMI Body mass index is 35.33 kg/m. GEN: Well nourished, well developed, in no acute distress  HEENT: normal  Neck: no JVD, carotid bruits, or masses Cardiac: RRR; no murmurs, rubs, or gallops,no edema  Respiratory:  clear to auscultation bilaterally, normal work of breathing GI: soft, nontender, nondistended, + BS MS: no deformity or atrophy  Skin: warm and dry, device pocket is well healed Neuro:  Strength and sensation are intact Psych: euthymic mood, full affect  EKG:  EKG is ordered today. Personal review of the ekg ordered shows Ennis rhythm, rate 70, PVC, inferior Q waves  Device interrogation is reviewed today in detail.  See PaceArt for details.   Recent Labs: 04/18/2021: BUN 16; Creatinine, Ser 1.02; Hemoglobin 16.2; Platelets  197; Potassium 4.5; Sodium 136 03/13/2022: ALT 23    Lipid Panel     Component Value Date/Time   CHOL 121 05/21/2010 1139   TRIG 95.0 05/21/2010 1139   HDL 34.60 (L) 05/21/2010 1139   CHOLHDL 3 05/21/2010 1139   VLDL 19.0 05/21/2010 1139   LDLCALC 67 05/21/2010 1139     Wt Readings from Last 3 Encounters:  03/26/22 246 lb 3.2 oz (111.7 kg)  02/12/22 246 lb 3.2 oz (111.7 kg)  08/17/21 238 lb 9.6 oz (108.2 kg)      Other studies Reviewed:  Additional studies/ records that were reviewed today include: TTE 10/31/20  Review of the above records today demonstrates:   1. Left ventricular ejection fraction, by estimation, is 25 to 30%. The  left ventricle has severely decreased function. The left ventricle  demonstrates regional wall motion abnormalities (see scoring  diagram/findings for description). Left ventricular  diastolic parameters are consistent with Grade I diastolic dysfunction  (impaired relaxation). There is severe akinesis of the left ventricular,  mid-apical anteroseptal wall, apical segment and anterolateral wall.   2. Right ventricular systolic function is normal. The right ventricular  size is normal. There is normal pulmonary artery systolic pressure.   3. Right atrial size was mildly dilated.   4. The mitral valve is grossly normal. No evidence of mitral valve  regurgitation.   5. The aortic valve is normal in structure. Aortic valve regurgitation is  not visualized. No aortic stenosis is present.    ASSESSMENT AND PLAN:  1.  Chronic systolic heart failure: Currently on optimal medical therapy per primary cardiology.  Status post ICD.  Device function appropriate.  No changes.  2.  Coronary artery disease: No current chest pain.  Plan per primary cardiology.  3.  Ventricular tachycardia: Had an episode of VT requiring ATP and ICD shock therapy over the weekend.  He has been advised of no driving for the next 6 months.  He had the episode when he was exerting  himself by cutting firewood he did not have much chest pain, though I do think he would benefit from left heart catheterization.  This was discussed with his primary cardiologist.  Risk and benefits have been discussed.  He understands the risks and is agreed to the procedure.  The patient understands that risks include but are not limited to stroke (1 in 1000), death (1 in 56), kidney failure [usually temporary] (1 in 500), bleeding (1 in 200), allergic reaction [possibly serious] (1 in 200), and agrees to proceed.  4.  Hypertension:well controlled  Current medicines are reviewed at length with the patient today.   The patient does not have concerns regarding his medicines.  The following changes were made today:  none  Labs/ tests ordered today include:  Orders Placed This Encounter  Procedures   Basic metabolic panel   CBC   EKG 12-Lead     Disposition:   FU with Mireille Lacombe 6 months  Signed, Jas Betten Meredith Leeds, MD  03/26/2022 4:05 PM     [address] [place] [address] [phone] (office) [phone] (fax)

## 2022-03-26 NOTE — Progress Notes (Signed)
Electrophysiology Office Note   Date:  03/26/2022   ID:  Gordon, Vandunk [birth year], MRN [id number]  PCP:  Frederich Chick., MD  Cardiologist:  Excell Seltzer Primary Electrophysiologist:  Rajat Staver Jorja Loa, MD    Chief Complaint: ICD   History of Present Illness: [name] [name] is a 62 y.o. male who is being seen today for the evaluation of ICD at the request of Frederich Chick., MD. Presenting today for electrophysiology evaluation.  He has a history significant for coronary artery disease status post anterior MI in 2008, chronic systolic heart failure, hypertension, hyperlipidemia, ventricular tachycardia.  This past weekend, he was cutting wood.  He was working in the cold.  He was taking the wood back into his house and had a pop sensation.  Device interrogation shows that he received an ICD shock.  He did get ATP prior to the shock did not convert him from ventricular tachycardia.  He has not had chest pain or shortness of breath.  This is his first shock in the last few years.  Today, he denies symptoms of palpitations, chest pain, shortness of breath, orthopnea, PND, lower extremity edema, claudication, dizziness, presyncope, syncope, bleeding, or neurologic sequela. The patient is tolerating medications without difficulties.    Past Medical History:  Diagnosis Date   CAD (coronary artery disease)    anterior MI 2008, treated with bare metal stent to LAD   Chronic systolic heart failure (HCC) 06/27/2010   Ischemic CM s/p ICD // Large anterior MI in 2008 tx with BMS to LAD // cMRI 6/10:  Ant, inf, septal, apical AK with full thickness scar, EF 23% // Echo 4/12: Inferoapical, apical, septal and dist ant AK, inferobasal HK, mild LVH, EF 25-30%, mild MR, mild LAE    GERD (gastroesophageal reflux disease)    HLD (hyperlipidemia)    HTN (hypertension)    Ischemic cardiomyopathy    NYHA Class II/III CHF   MI (myocardial infarction) (HCC)    Pericarditis; post-MI     w/o recurrence   Umbilical hernia    Ventricular tachycardia (HCC) 07/29/2018   received appropriate ATP therapy for VT at 218 bp with syncope   Past Surgical History:  Procedure Laterality Date   CARDIAC DEFIBRILLATOR PLACEMENT  08/07/2010   SJM Fortify VR ST implanted by Dr Johney Frame, part of the Analyze ST ICD study   DEFIBRILLATOR FUNCTION TEST     FINGER SURGERY  04/05/2007   laceration repair   ICD GENERATOR CHANGEOUT N/A 04/27/2021   Procedure: ICD GENERATOR CHANGEOUT;  Surgeon: Hillis Range, MD;  Location: [facility] INVASIVE CV LAB;  Service: Cardiovascular;  Laterality: N/A;     Current Outpatient Medications  Medication Sig Dispense Refill   ARIPiprazole (ABILIFY) 10 MG tablet Take 10 mg by mouth daily as needed (for ptsd).     aspirin 81 MG tablet Take 81 mg by mouth daily.     carvedilol (COREG) 25 MG tablet Take 1 tablet (25 mg total) by mouth 2 (two) times daily with a meal. 60 tablet 0   cholecalciferol (VITAMIN D) 1000 UNITS tablet Take 1,000 Units by mouth daily.     diclofenac Sodium (VOLTAREN) 1 % GEL APPLY 2GM TOPICALLY 4 TIMES A DAY     fish oil-omega-3 fatty acids 1000 MG capsule Take 1 g by mouth daily.      fluticasone (FLONASE) 50 MCG/ACT nasal spray Place 2 sprays into both nostrils as needed for allergies or rhinitis.  folic acid (FOLVITE) 1 MG tablet TAKE 2 TABLETS BY MOUTH EVERY DAY ###     loratadine (CLARITIN) 10 MG tablet Take 10 mg by mouth daily as needed for allergies.     Multiple Vitamin (MULTIVITAMIN) tablet Take 1 tablet by mouth daily.       nitroGLYCERIN (NITROSTAT) 0.4 MG SL tablet Place 1 tablet (0.4 mg total) under the tongue every 5 (five) minutes x 3 doses as needed for chest pain. 25 tablet 1   omeprazole (PRILOSEC) 20 MG capsule Take 20 mg by mouth as needed (gerd/reflux).     PARoxetine (PAXIL) 40 MG tablet Take 40 mg by mouth daily as needed (for PTSD).     sacubitril-valsartan (ENTRESTO) 97-103 MG Take 1 tablet by mouth 2 (two) times daily.  180 tablet 2   simvastatin (ZOCOR) 80 MG tablet Take 40 mg by mouth at bedtime.     spironolactone (ALDACTONE) 25 MG tablet Take 1 tablet (25 mg total) by mouth daily. 90 tablet 3   terbinafine (LAMISIL) 250 MG tablet Take 1 tablet (250 mg total) by mouth daily. 90 tablet 0   vitamin C (ASCORBIC ACID) 500 MG tablet Take 500 mg by mouth daily.     No current facility-administered medications for this visit.    Allergies:   Patient has no known allergies.   Social History:  The patient  reports that he quit smoking about 16 years ago. His smoking use included cigars. He has never used smokeless tobacco. He reports current alcohol use. He reports that he does not use drugs.   Family History:  The patient's family history includes Diabetes in his mother and unknown relative; Heart Problems in his father; Heart failure in his unknown relative; Hypertension in his mother.    ROS:  Please see the history of present illness.   Otherwise, review of systems is positive for none.   All other systems are reviewed and negative.    PHYSICAL EXAM: VS:  BP 114/72   Pulse 70   Ht 5\' 10"  (1.778 m)   Wt 246 lb 3.2 oz (111.7 kg)   SpO2 94%   BMI 35.33 kg/m  , BMI Body mass index is 35.33 kg/m. GEN: Well nourished, well developed, in no acute distress  HEENT: normal  Neck: no JVD, carotid bruits, or masses Cardiac: RRR; no murmurs, rubs, or gallops,no edema  Respiratory:  clear to auscultation bilaterally, normal work of breathing GI: soft, nontender, nondistended, + BS MS: no deformity or atrophy  Skin: warm and dry, device pocket is well healed Neuro:  Strength and sensation are intact Psych: euthymic mood, full affect  EKG:  EKG is ordered today. Personal review of the ekg ordered shows Ennis rhythm, rate 70, PVC, inferior Q waves  Device interrogation is reviewed today in detail.  See PaceArt for details.   Recent Labs: 04/18/2021: BUN 16; Creatinine, Ser 1.02; Hemoglobin 16.2; Platelets  197; Potassium 4.5; Sodium 136 03/13/2022: ALT 23    Lipid Panel     Component Value Date/Time   CHOL 121 05/21/2010 1139   TRIG 95.0 05/21/2010 1139   HDL 34.60 (L) 05/21/2010 1139   CHOLHDL 3 05/21/2010 1139   VLDL 19.0 05/21/2010 1139   LDLCALC 67 05/21/2010 1139     Wt Readings from Last 3 Encounters:  03/26/22 246 lb 3.2 oz (111.7 kg)  02/12/22 246 lb 3.2 oz (111.7 kg)  08/17/21 238 lb 9.6 oz (108.2 kg)      Other studies Reviewed:  Additional studies/ records that were reviewed today include: TTE 10/31/20  Review of the above records today demonstrates:   1. Left ventricular ejection fraction, by estimation, is 25 to 30%. The  left ventricle has severely decreased function. The left ventricle  demonstrates regional wall motion abnormalities (see scoring  diagram/findings for description). Left ventricular  diastolic parameters are consistent with Grade I diastolic dysfunction  (impaired relaxation). There is severe akinesis of the left ventricular,  mid-apical anteroseptal wall, apical segment and anterolateral wall.   2. Right ventricular systolic function is normal. The right ventricular  size is normal. There is normal pulmonary artery systolic pressure.   3. Right atrial size was mildly dilated.   4. The mitral valve is grossly normal. No evidence of mitral valve  regurgitation.   5. The aortic valve is normal in structure. Aortic valve regurgitation is  not visualized. No aortic stenosis is present.    ASSESSMENT AND PLAN:  1.  Chronic systolic heart failure: Currently on optimal medical therapy per primary cardiology.  Status post ICD.  Device function appropriate.  No changes.  2.  Coronary artery disease: No current chest pain.  Plan per primary cardiology.  3.  Ventricular tachycardia: Had an episode of VT requiring ATP and ICD shock therapy over the weekend.  He has been advised of no driving for the next 6 months.  He had the episode when he was exerting  himself by cutting firewood he did not have much chest pain, though I do think he would benefit from left heart catheterization.  This was discussed with his primary cardiologist.  Risk and benefits have been discussed.  He understands the risks and is agreed to the procedure.  The patient understands that risks include but are not limited to stroke (1 in 1000), death (1 in 56), kidney failure [usually temporary] (1 in 500), bleeding (1 in 200), allergic reaction [possibly serious] (1 in 200), and agrees to proceed.  4.  Hypertension:well controlled  Current medicines are reviewed at length with the patient today.   The patient does not have concerns regarding his medicines.  The following changes were made today:  none  Labs/ tests ordered today include:  Orders Placed This Encounter  Procedures   Basic metabolic panel   CBC   EKG 12-Lead     Disposition:   FU with Timoty Bourke 6 months  Signed, Gemini Bunte Meredith Leeds, MD  03/26/2022 4:05 PM     [address] [place] [address] [phone] (office) [phone] (fax)

## 2022-03-26 NOTE — Patient Instructions (Addendum)
Medication Instructions:  Your physician recommends that you continue on your current medications as directed. Please refer to the Current Medication list given to you today.  *If you need a refill on your cardiac medications before your next appointment, please call your pharmacy*   Lab Work: Pre procedure labs today: BMET  & CBC  If you have labs (blood work) drawn today and your tests are completely normal, you will receive your results only by: [place] (if you have MyChart) OR A paper copy in the mail If you have any lab test that is abnormal or we need to change your treatment, we will call you to review the results.   Testing/Procedures: Your physician has requested that you have a cardiac catheterization. Cardiac catheterization is used to diagnose and/or treat various heart conditions. Doctors may recommend this procedure for a number of different reasons. The most common reason is to evaluate chest pain. Chest pain can be a symptom of coronary artery disease (CAD), and cardiac catheterization can show whether plaque is narrowing or blocking your heart's arteries. This procedure is also used to evaluate the valves, as well as measure the blood flow and oxygen levels in different parts of your heart. For further information please visit [email or web address][facility]tn. Please follow instruction sheet, as given.   Follow-Up: At [facility], you and your health needs are our priority.  As part of our continuing mission to provide you with exceptional heart care, we have created designated Provider Care Teams.  These Care Teams include your primary Cardiologist (physician) and Advanced Practice Providers (APPs -  Physician Assistants and Nurse Practitioners) who all work together to provide you with the care you need, when you need it.   Your next appointment:   6 month(s)  The format for your next appointment:   In Person  Provider:   Allegra Lai, MD    Thank you for choosing [facility]!!   Trinidad Curet, RN [phone]  Other Instructions

## 2022-03-27 LAB — BASIC METABOLIC PANEL
BUN/Creatinine Ratio: 13 (ref 10–24)
BUN: 14 mg/dL (ref 8–27)
CO2: 25 mmol/L (ref 20–29)
Calcium: 9.1 mg/dL (ref 8.6–10.2)
Chloride: 103 mmol/L (ref 96–106)
Creatinine, Ser: 1.08 mg/dL (ref 0.76–1.27)
Glucose: 88 mg/dL (ref 70–99)
Potassium: 4.3 mmol/L (ref 3.5–5.2)
Sodium: 141 mmol/L (ref 134–144)
eGFR: 78 mL/min/{1.73_m2} (ref >59)

## 2022-03-27 LAB — CBC
Hematocrit: 41.3 % (ref 37.5–51.0)
Hemoglobin: 14.1 g/dL (ref 13.0–17.7)
MCH: 29.3 pg (ref 26.6–33.0)
MCHC: 34.1 g/dL (ref 31.5–35.7)
MCV: 86 fL (ref 79–97)
Platelets: 179 10*3/uL (ref 150–450)
RBC: 4.82 x10E6/uL (ref 4.14–5.80)
RDW: 12.4 % (ref 11.6–15.4)
WBC: 9.1 10*3/uL (ref 3.4–10.8)

## 2022-03-28 ENCOUNTER — Telehealth: Payer: Self-pay | Admitting: *Deleted

## 2022-03-28 NOTE — Telephone Encounter (Signed)
Cardiac Catheterization scheduled at [facility] for: Monday April 01, 2022 9 AM Arrival time and place: [place] Entrance A at: 7 AM  Nothing to eat after midnight prior to procedure, clear liquids until 5 AM day of procedure.  Medication instructions: -Hold  Spironolactone-AM of procedure  -Except hold medications usual morning medications can be taken with sips of water including aspirin 81 mg.  Confirmed patient has responsible adult to drive home post procedure and be with patient first 24 hours after arriving home.  Patient reports no new symptoms concerning for COVID-19 in the past 10 days.  Reviewed procedure instructions with patient.

## 2022-04-01 ENCOUNTER — Encounter (HOSPITAL_COMMUNITY): Payer: Self-pay | Admitting: Cardiovascular Disease

## 2022-04-01 ENCOUNTER — Ambulatory Visit (HOSPITAL_COMMUNITY)
Admission: RE | Admit: 2022-04-01 | Discharge: 2022-04-01 | Disposition: A | Discharge status: Home / Self Care | Payer: No Typology Code available for payment source | Source: Ambulatory Visit | Attending: Cardiovascular Disease | Admitting: Cardiovascular Disease

## 2022-04-01 ENCOUNTER — Encounter (HOSPITAL_COMMUNITY)
Admission: RE | Disposition: A | Discharge status: Home / Self Care | Payer: Self-pay | Source: Ambulatory Visit | Attending: Cardiovascular Disease

## 2022-04-01 DIAGNOSIS — I472 Ventricular tachycardia, unspecified: Secondary | ICD-10-CM | POA: Diagnosis present

## 2022-04-01 DIAGNOSIS — E785 Hyperlipidemia, unspecified: Secondary | ICD-10-CM | POA: Insufficient documentation

## 2022-04-01 DIAGNOSIS — I255 Ischemic cardiomyopathy: Secondary | ICD-10-CM | POA: Insufficient documentation

## 2022-04-01 DIAGNOSIS — Z955 Presence of coronary angioplasty implant and graft: Secondary | ICD-10-CM | POA: Insufficient documentation

## 2022-04-01 DIAGNOSIS — I110 Hypertensive heart disease with heart failure: Secondary | ICD-10-CM | POA: Insufficient documentation

## 2022-04-01 DIAGNOSIS — I2510 Atherosclerotic heart disease of native coronary artery without angina pectoris: Secondary | ICD-10-CM | POA: Insufficient documentation

## 2022-04-01 DIAGNOSIS — I2582 Chronic total occlusion of coronary artery: Secondary | ICD-10-CM | POA: Insufficient documentation

## 2022-04-01 DIAGNOSIS — I5022 Chronic systolic (congestive) heart failure: Secondary | ICD-10-CM | POA: Insufficient documentation

## 2022-04-01 DIAGNOSIS — I4729 Other ventricular tachycardia: Secondary | ICD-10-CM | POA: Insufficient documentation

## 2022-04-01 DIAGNOSIS — Z87891 Personal history of nicotine dependence: Secondary | ICD-10-CM | POA: Insufficient documentation

## 2022-04-01 DIAGNOSIS — Z95810 Presence of automatic (implantable) cardiac defibrillator: Secondary | ICD-10-CM | POA: Insufficient documentation

## 2022-04-01 DIAGNOSIS — I252 Old myocardial infarction: Secondary | ICD-10-CM | POA: Insufficient documentation

## 2022-04-01 HISTORY — PX: LEFT HEART CATH AND CORONARY ANGIOGRAPHY: CATH118249

## 2022-04-01 SURGERY — LEFT HEART CATH AND CORONARY ANGIOGRAPHY
Anesthesia: LOCAL

## 2022-04-01 MED ORDER — IOHEXOL 350 MG/ML SOLN
INTRAVENOUS | Status: DC | PRN
  Administered 2022-04-01: 50 mL

## 2022-04-01 MED ORDER — HEPARIN SODIUM (PORCINE) 1000 UNIT/ML IJ SOLN
INTRAMUSCULAR | Status: AC
  Filled 2022-04-01: qty 10

## 2022-04-01 MED ORDER — HEPARIN (PORCINE) IN NACL 1000-0.9 UT/500ML-% IV SOLN
INTRAVENOUS | Status: AC
  Filled 2022-04-01: qty 500

## 2022-04-01 MED ORDER — FENTANYL CITRATE (PF) 100 MCG/2ML IJ SOLN
INTRAMUSCULAR | Status: DC | PRN
  Administered 2022-04-01: 25 ug via INTRAVENOUS

## 2022-04-01 MED ORDER — VERAPAMIL HCL 2.5 MG/ML IV SOLN
INTRAVENOUS | Status: AC
  Filled 2022-04-01: qty 2

## 2022-04-01 MED ORDER — SODIUM CHLORIDE 0.9 % IV SOLN: INTRAVENOUS | Status: DC

## 2022-04-01 MED ORDER — VERAPAMIL HCL 2.5 MG/ML IV SOLN
INTRAVENOUS | Status: DC | PRN
  Administered 2022-04-01: 10 mL via INTRA_ARTERIAL

## 2022-04-01 MED ORDER — MIDAZOLAM HCL 2 MG/2ML IJ SOLN
INTRAMUSCULAR | Status: DC | PRN
  Administered 2022-04-01: 2 mg via INTRAVENOUS

## 2022-04-01 MED ORDER — SODIUM CHLORIDE 0.9 % IV SOLN: 250.0000 mL | INTRAVENOUS | Status: DC | PRN

## 2022-04-01 MED ORDER — SODIUM CHLORIDE 0.9% FLUSH: 3.0000 mL | Freq: Two times a day (BID) | INTRAVENOUS | Status: DC

## 2022-04-01 MED ORDER — ONDANSETRON HCL 4 MG/2ML IJ SOLN: 4.0000 mg | Freq: Four times a day (QID) | INTRAMUSCULAR | Status: DC | PRN

## 2022-04-01 MED ORDER — ASPIRIN 81 MG PO CHEW: 81.0000 mg | CHEWABLE_TABLET | ORAL | Status: DC

## 2022-04-01 MED ORDER — ACETAMINOPHEN 325 MG PO TABS: 650.0000 mg | ORAL_TABLET | ORAL | Status: DC | PRN

## 2022-04-01 MED ORDER — LIDOCAINE HCL (PF) 1 % IJ SOLN
INTRAMUSCULAR | Status: AC
  Filled 2022-04-01: qty 30

## 2022-04-01 MED ORDER — LIDOCAINE HCL (PF) 1 % IJ SOLN
INTRAMUSCULAR | Status: DC | PRN
  Administered 2022-04-01: 2 mL

## 2022-04-01 MED ORDER — HEPARIN SODIUM (PORCINE) 1000 UNIT/ML IJ SOLN
INTRAMUSCULAR | Status: DC | PRN
  Administered 2022-04-01: 5000 [IU] via INTRAVENOUS

## 2022-04-01 MED ORDER — FENTANYL CITRATE (PF) 100 MCG/2ML IJ SOLN
INTRAMUSCULAR | Status: AC
  Filled 2022-04-01: qty 2

## 2022-04-01 MED ORDER — LABETALOL HCL 5 MG/ML IV SOLN: 10.0000 mg | INTRAVENOUS | Status: DC | PRN

## 2022-04-01 MED ORDER — SODIUM CHLORIDE 0.9 % WEIGHT BASED INFUSION: 1.0000 mL/kg/h | INTRAVENOUS | Status: DC

## 2022-04-01 MED ORDER — SODIUM CHLORIDE 0.9% FLUSH: 3.0000 mL | INTRAVENOUS | Status: DC | PRN

## 2022-04-01 MED ORDER — HYDRALAZINE HCL 20 MG/ML IJ SOLN: 10.0000 mg | INTRAMUSCULAR | Status: DC | PRN

## 2022-04-01 MED ORDER — HEPARIN (PORCINE) IN NACL 1000-0.9 UT/500ML-% IV SOLN
INTRAVENOUS | Status: DC | PRN
  Administered 2022-04-01 (×2): 500 mL

## 2022-04-01 MED ORDER — MIDAZOLAM HCL 2 MG/2ML IJ SOLN
INTRAMUSCULAR | Status: AC
  Filled 2022-04-01: qty 2

## 2022-04-01 MED ORDER — HEPARIN (PORCINE) IN NACL 1000-0.9 UT/500ML-% IV SOLN
INTRAVENOUS | Status: AC
  Filled 2022-04-01: qty 1000

## 2022-04-01 SURGICAL SUPPLY — 11 items
CATH 5FR JL3.5 JR4 ANG PIG MP (CATHETERS) IMPLANT
DEVICE RAD COMP TR BAND LRG (VASCULAR PRODUCTS) IMPLANT
GLIDESHEATH SLEND SS 6F .021 (SHEATH) IMPLANT
GUIDEWIRE INQWIRE 1.5J.035X260 (WIRE) IMPLANT
INQWIRE 1.5J .035X260CM (WIRE) ×1
KIT HEART LEFT (KITS) ×2 IMPLANT
PACK CARDIAC CATHETERIZATION (CUSTOM PROCEDURE TRAY) ×2 IMPLANT
PROTECTION STATION PRESSURIZED (MISCELLANEOUS) ×1
STATION PROTECTION PRESSURIZED (MISCELLANEOUS) IMPLANT
TRANSDUCER W/STOPCOCK (MISCELLANEOUS) ×2 IMPLANT
TUBING CIL FLEX 10 FLL-RA (TUBING) ×2 IMPLANT

## 2022-04-01 NOTE — Interval H&P Note (Signed)
History and Physical Interval Note:  04/01/2022 9:12 AM  [name] [name]  has presented today for surgery, with the diagnosis of vt.  The various methods of treatment have been discussed with the patient and family. After consideration of risks, benefits and other options for treatment, the patient has consented to  Procedure(s): LEFT HEART CATH AND CORONARY ANGIOGRAPHY (N/A) as a surgical intervention.  The patient's history has been reviewed, patient examined, no change in status, stable for surgery.  I have reviewed the patient's chart and labs.  Questions were answered to the patient's satisfaction.     Sherren Mocha

## 2022-04-23 ENCOUNTER — Ambulatory Visit: Payer: No Typology Code available for payment source

## 2022-04-23 DIAGNOSIS — I5022 Chronic systolic (congestive) heart failure: Secondary | ICD-10-CM

## 2022-04-23 DIAGNOSIS — Z95810 Presence of automatic (implantable) cardiac defibrillator: Secondary | ICD-10-CM

## 2022-04-26 NOTE — Progress Notes (Signed)
EPIC Encounter for ICM Monitoring  Patient Name: [name] [name] is a 62 y.o. male Date: 04/26/2022 Primary Care Physican: [facility] Primary Cardiologist: Burt Knack    Electrophysiologist: Curt Bears 11/12/2021 Weight: 231 lbs    04/26/2022 Weight: 241-242 lbs                                     Spoke with patient and heart failure questions reviewed.  Transmission results reviewed.  Pt asymptomatic for fluid accumulation.  Reports feeling well at this time and voices no complaints.     CorVue thoracic impedance suggesting normal fluid levels.   Prescribed:  Spironolactone 25 mg take 1 tablet daily   Recommendations:     No changes and encouraged to call if experiencing any fluid symptoms.   Follow-up plan: ICM clinic phone appointment on 05/27/2022.   91 day device clinic remote transmission 07/30/2022.     EP/Cardiology Office Visits:  Recall 02/07/2023 with Dr Burt Knack.  Recall 08/22/2022 with Oda Kilts, PA.  Recall 08/22/2022 with Dr York Cerise APP.   Copy of ICM check sent to Dr. Curt Bears.   3 month ICM trend: 04/23/2022.    12-14 Month ICM trend:     Rosalene Billings, RN 04/26/2022 4:11 PM

## 2022-04-30 ENCOUNTER — Ambulatory Visit (INDEPENDENT_AMBULATORY_CARE_PROVIDER_SITE_OTHER): Payer: Self-pay

## 2022-04-30 DIAGNOSIS — I472 Ventricular tachycardia, unspecified: Secondary | ICD-10-CM

## 2022-05-02 LAB — CUP PACEART REMOTE DEVICE CHECK
Battery Remaining Longevity: 109 mo
Battery Remaining Percentage: 88 %
Battery Voltage: 3.01 V
Brady Statistic RV Percent Paced: 1 %
Date Time Interrogation Session: 20240229024146
HighPow Impedance: 65 Ohm
Implantable Pulse Generator Implant Date: 20230224
Lead Channel Impedance Value: 400 Ohm
Lead Channel Pacing Threshold Amplitude: 1 V
Lead Channel Pacing Threshold Pulse Width: 0.5 ms
Lead Channel Sensing Intrinsic Amplitude: 10.3 mV
Lead Channel Setting Pacing Amplitude: 2.5 V
Lead Channel Setting Pacing Pulse Width: 0.5 ms
Lead Channel Setting Sensing Sensitivity: 0.5 mV
Pulse Gen Serial Number: 111056600
Zone Setting Status: 755011

## 2022-05-27 ENCOUNTER — Ambulatory Visit: Payer: No Typology Code available for payment source | Attending: Cardiology

## 2022-05-27 DIAGNOSIS — Z95810 Presence of automatic (implantable) cardiac defibrillator: Secondary | ICD-10-CM

## 2022-05-27 DIAGNOSIS — I5022 Chronic systolic (congestive) heart failure: Secondary | ICD-10-CM

## 2022-05-31 NOTE — Progress Notes (Signed)
EPIC Encounter for ICM Monitoring  Patient Name: [name] [name] is a 62 y.o. male Date: 05/31/2022 Primary Care Physican: [facility] Primary Cardiologist: Burt Knack    Electrophysiologist: Curt Bears 11/12/2021 Weight: 231 lbs    04/26/2022 Weight: 241-242 lbs                                     Spoke with patient and heart failure questions reviewed.  Transmission results reviewed.  Pt asymptomatic for fluid accumulation.  Reports feeling well at this time and voices no complaints.     CorVue thoracic impedance suggesting normal fluid levels with exception of possible fluid accumulation from 3/18-3/23.   Prescribed:  Spironolactone 25 mg take 1 tablet daily   Recommendations:     No changes and encouraged to call if experiencing any fluid symptoms.   Follow-up plan: ICM clinic phone appointment on 07/01/2022.   91 day device clinic remote transmission 07/30/2022.     EP/Cardiology Office Visits:  Recall 02/07/2023 with Dr Burt Knack.  Recall 08/22/2022 with Oda Kilts, PA.  Recall 08/22/2022 with Dr York Cerise APP.   Copy of ICM check sent to Dr. Curt Bears.    3 month ICM trend: 05/27/2022.    12-14 Month ICM trend:     Rosalene Billings, RN 05/31/2022 2:46 PM

## 2022-06-05 NOTE — Progress Notes (Signed)
Remote ICD transmission.   

## 2022-07-01 ENCOUNTER — Ambulatory Visit: Payer: No Typology Code available for payment source | Attending: Cardiology

## 2022-07-01 DIAGNOSIS — Z95810 Presence of automatic (implantable) cardiac defibrillator: Secondary | ICD-10-CM

## 2022-07-01 DIAGNOSIS — I5022 Chronic systolic (congestive) heart failure: Secondary | ICD-10-CM

## 2022-07-02 ENCOUNTER — Other Ambulatory Visit: Payer: Self-pay | Admitting: Podiatry

## 2022-07-04 ENCOUNTER — Ambulatory Visit (INDEPENDENT_AMBULATORY_CARE_PROVIDER_SITE_OTHER): Payer: No Typology Code available for payment source | Admitting: Podiatry

## 2022-07-04 DIAGNOSIS — B351 Tinea unguium: Secondary | ICD-10-CM | POA: Diagnosis not present

## 2022-07-04 DIAGNOSIS — Z79899 Other long term (current) drug therapy: Secondary | ICD-10-CM

## 2022-07-04 NOTE — Progress Notes (Signed)
Subjective:  Patient ID: [name] [name], male    DOB: 02/24/1961,  MRN: [id number]  Chief Complaint  Patient presents with   Nail Problem    Nail fungus follow up    62 y.o. male presents with the above complaint.  Patient presents for follow-up of bilateral elongated thickened onychomycosis x 2.  Patient states that he is doing little bit better.  He is here to get it evaluated and start another medication if needed.   Review of Systems: Negative except as noted in the HPI. Denies N/V/F/Ch.  Past Medical History:  Diagnosis Date   CAD (coronary artery disease)    anterior MI 2008, treated with bare metal stent to LAD   Chronic systolic heart failure (HCC) 06/27/2010   Ischemic CM s/p ICD // Large anterior MI in 2008 tx with BMS to LAD // cMRI 6/10:  Ant, inf, septal, apical AK with full thickness scar, EF 23% // Echo 4/12: Inferoapical, apical, septal and dist ant AK, inferobasal HK, mild LVH, EF 25-30%, mild MR, mild LAE    GERD (gastroesophageal reflux disease)    HLD (hyperlipidemia)    HTN (hypertension)    Ischemic cardiomyopathy    NYHA Class II/III CHF   MI (myocardial infarction) (HCC)    Pericarditis; 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   Umbilical hernia    Ventricular tachycardia (HCC) 07/29/2018   received appropriate ATP therapy for VT at 218 bp with syncope    Current Outpatient Medications:    acetaminophen (TYLENOL) 500 MG tablet, Take 1,000 mg by mouth every 6 (six) hours as needed for moderate pain., Disp: , Rfl:    ARIPiprazole (ABILIFY) 10 MG tablet, Take 10 mg by mouth daily as needed (for ptsd)., Disp: , Rfl:    aspirin 81 MG tablet, Take 81 mg by mouth daily., Disp: , Rfl:    aspirin-sod bicarb-citric acid (ALKA-SELTZER) 325 MG TBEF tablet, Take 650 mg by mouth every 6 (six) hours as needed (indigestion)., Disp: , Rfl:    carvedilol (COREG) 25 MG tablet, Take 1 tablet (25 mg total) by mouth 2 (two) times daily with a meal., Disp: 60 tablet, Rfl: 0    cholecalciferol (VITAMIN D) 1000 UNITS tablet, Take 1,000 Units by mouth daily., Disp: , Rfl:    diclofenac Sodium (VOLTAREN) 1 % GEL, Apply 2 g topically 4 (four) times daily as needed (pain)., Disp: , Rfl:    fish oil-omega-3 fatty acids 1000 MG capsule, Take 1 g by mouth daily. , Disp: , Rfl:    fluticasone (FLONASE) 50 MCG/ACT nasal spray, Place 2 sprays into both nostrils as needed for allergies or rhinitis., Disp: , Rfl:    lidocaine (LIDODERM) 5 %, Place 1 patch onto the skin daily as needed (pain). Remove & Discard patch within 12 hours or as directed by MD, Disp: , Rfl:    loratadine (CLARITIN) 10 MG tablet, Take 10 mg by mouth daily as needed for allergies., Disp: , Rfl:    Multiple Vitamin (MULTIVITAMIN) tablet, Take 1 tablet by mouth daily.  , Disp: , Rfl:    nitroGLYCERIN (NITROSTAT) 0.4 MG SL tablet, Place 1 tablet (0.4 mg total) under the tongue every 5 (five) minutes x 3 doses as needed for chest pain., Disp: 25 tablet, Rfl: 1   omeprazole (PRILOSEC) 20 MG capsule, Take 20 mg by mouth as needed (gerd/reflux)., Disp: , Rfl:    PARoxetine (PAXIL) 40 MG tablet, Take 40 mg by mouth daily as needed (for PTSD)., Disp: ,  Rfl:    sacubitril-valsartan (ENTRESTO) 97-103 MG, Take 1 tablet by mouth 2 (two) times daily., Disp: 180 tablet, Rfl: 2   simvastatin (ZOCOR) 80 MG tablet, Take 40 mg by mouth at bedtime., Disp: , Rfl:    spironolactone (ALDACTONE) 25 MG tablet, Take 1 tablet (25 mg total) by mouth daily., Disp: 90 tablet, Rfl: 3   terbinafine (LAMISIL) 250 MG tablet, Take 1 tablet (250 mg total) by mouth daily., Disp: 90 tablet, Rfl: 0   Tetrahydrozoline HCl (VISINE OP), Place 1 drop into both eyes daily as needed (irritation)., Disp: , Rfl:    vitamin C (ASCORBIC ACID) 500 MG tablet, Take 500 mg by mouth daily., Disp: , Rfl:   Social History   Tobacco Use  Smoking Status Former   Types: Cigars   Quit date: 03/04/2006   Years since quitting: 16.3  Smokeless Tobacco Never    No  Known Allergies Objective:  There were no vitals filed for this visit. There is no height or weight on file to calculate BMI. Constitutional Well developed. Well nourished.  Vascular Dorsalis pedis pulses palpable bilaterally. Posterior tibial pulses palpable bilaterally. Capillary refill normal to all digits.  No cyanosis or clubbing noted. Pedal hair growth normal.  Neurologic Normal speech. Oriented to person, place, and time. Epicritic sensation to light touch grossly present bilaterally.  Dermatologic Nails thickened and again dystrophic mycotic toenails x 2 Skin within normal limits  Orthopedic: Normal joint ROM without pain or crepitus bilaterally. No visible deformities. No bony tenderness.   Radiographs: None Assessment:   1. Long-term use of high-risk medication   2. Nail fungus   3. Onychomycosis due to dermatophyte    Plan:  Patient was evaluated and treated and all questions answered.  Bilateral hallux onychomycosis~second round -Educated the patient on the etiology of onychomycosis and various treatment options associated with improving the fungal load.  I explained to the patient that there is 3 treatment options available to treat the onychomycosis including topical, p.o., laser treatment.  Patient elected to undergo p.o. options with second round of Lamisil/terbinafine therapy.  In order for me to start the medication therapy, I explained to the patient the importance of evaluating the liver and obtaining the liver function test.  Once the liver function test comes back normal I will start him on second round of 57-month course of Lamisil therapy.  Patient understood all risk and would like to proceed with Lamisil therapy.  I have asked the patient to immediately stop the Lamisil therapy if she has any reactions to it and call the office or go to the emergency room right away.  Patient states understanding   No follow-ups on file.

## 2022-07-05 NOTE — Progress Notes (Signed)
EPIC Encounter for ICM Monitoring  Patient Name: KAGAN TIBBITTS is a 62 y.o. male Date: 07/05/2022 Primary Care [facility]ican: [facility] Primary Cardiologist: Excell Seltzer    Electrophysiologist: Elberta Fortis 11/12/2021 Weight: 231 lbs    04/26/2022 Weight: 241-242 lbs    07/05/2022 Weight: 240 lbs                                  Spoke with patient and heart failure questions reviewed.  Transmission results reviewed.  Pt asymptomatic for fluid accumulation.  Reports feeling well at this time and voices no complaints.     CorVue thoracic impedance suggesting normal fluid levels with exception of possible fluid accumulation from 4/1-4/9.   Prescribed:  Spironolactone 25 mg take 1 tablet daily   Recommendations:     No changes and encouraged to call if experiencing any fluid symptoms.   Follow-up plan: ICM clinic phone appointment on 08/05/2022.   91 day device clinic remote transmission 07/30/2022.     EP/Cardiology Office Visits:  08/12/2022 with Gypsy Balsam, NP.  08/26/2022 with Otilio Saber, PA.  Recall 08/22/2022 with Dr Randolm Idol APP.   Copy of ICM check sent to Dr. Elberta Fortis.    3 month ICM trend: 07/01/2022.    12-14 Month ICM trend:     Karie Soda, RN 07/05/2022 4:13 PM

## 2022-07-12 ENCOUNTER — Ambulatory Visit: Payer: No Typology Code available for payment source | Admitting: Podiatry

## 2022-07-30 ENCOUNTER — Ambulatory Visit (INDEPENDENT_AMBULATORY_CARE_PROVIDER_SITE_OTHER): Payer: Self-pay

## 2022-07-30 DIAGNOSIS — I472 Ventricular tachycardia, unspecified: Secondary | ICD-10-CM

## 2022-08-01 LAB — CUP PACEART REMOTE DEVICE CHECK
Battery Remaining Longevity: 106 mo
Battery Remaining Percentage: 87 %
Battery Voltage: 3.01 V
Brady Statistic RV Percent Paced: 1 %
Date Time Interrogation Session: 20240528020036
HighPow Impedance: 65 Ohm
Implantable Pulse Generator Implant Date: 20230224
Lead Channel Impedance Value: 410 Ohm
Lead Channel Pacing Threshold Amplitude: 1 V
Lead Channel Pacing Threshold Pulse Width: 0.5 ms
Lead Channel Sensing Intrinsic Amplitude: 11.8 mV
Lead Channel Setting Pacing Amplitude: 2.5 V
Lead Channel Setting Pacing Pulse Width: 0.5 ms
Lead Channel Setting Sensing Sensitivity: 0.5 mV
Pulse Gen Serial Number: 111056600
Zone Setting Status: 755011

## 2022-08-05 ENCOUNTER — Ambulatory Visit: Payer: No Typology Code available for payment source | Attending: Cardiology

## 2022-08-05 DIAGNOSIS — Z95810 Presence of automatic (implantable) cardiac defibrillator: Secondary | ICD-10-CM

## 2022-08-05 DIAGNOSIS — I5022 Chronic systolic (congestive) heart failure: Secondary | ICD-10-CM

## 2022-08-08 NOTE — Progress Notes (Signed)
EPIC Encounter for ICM Monitoring  Patient Name: [name] [name] is a 62 y.o. male Date: 08/08/2022 Primary Care Physican: [facility] Primary Cardiologist: Excell Seltzer    Electrophysiologist: Elberta Fortis 11/12/2021 Weight: 231 lbs    04/26/2022 Weight: 241-242 lbs    07/05/2022 Weight: 240 lbs    08/08/2022 Weight: 240 lbs                               Spoke with patient and heart failure questions reviewed.  Transmission results reviewed.  Pt asymptomatic for fluid accumulation.  Reports feeling well at this time and voices no complaints.     CorVue thoracic impedance suggesting intermittent days with possible fluid accumulation within the last month.   Prescribed:  Spironolactone 25 mg take 1 tablet daily   Recommendations:     No changes and encouraged to call if experiencing any fluid symptoms.   Follow-up plan: ICM clinic phone appointment on 09/09/2022.   91 day device clinic remote transmission 10/29/2022.     EP/Cardiology Office Visits:  08/12/2022 with Gypsy Balsam, NP.  08/26/2022 with Otilio Saber, PA.  Recall 08/22/2022 with Dr Randolm Idol APP.   Copy of ICM check sent to Dr. Elberta Fortis.    3 month ICM trend: 08/05/2022.    12-14 Month ICM trend:     Karie Soda, RN 08/08/2022 1:03 PM

## 2022-08-11 NOTE — Progress Notes (Unsigned)
Office Visit    Patient Name: [name] [name] Date of Encounter: 08/11/2022  Primary Care Provider:  [facility] Primary Cardiologist:  Tonny Bollman, MD Primary Electrophysiologist: Hillis Range, MD (Inactive)   Past Medical History    Past Medical History:  Diagnosis Date   CAD (coronary artery disease)    anterior MI 2008, treated with bare metal stent to LAD   Chronic systolic heart failure (HCC) 06/27/2010   Ischemic CM s/p ICD // Large anterior MI in 2008 tx with BMS to LAD // cMRI 6/10:  Ant, inf, septal, apical AK with full thickness scar, EF 23% // Echo 4/12: Inferoapical, apical, septal and dist ant AK, inferobasal HK, mild LVH, EF 25-30%, mild MR, mild LAE    GERD (gastroesophageal reflux disease)    HLD (hyperlipidemia)    HTN (hypertension)    Ischemic cardiomyopathy    NYHA Class II/III CHF   MI (myocardial infarction) (HCC)    Pericarditis; 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   Umbilical hernia    Ventricular tachycardia (HCC) 07/29/2018   received appropriate ATP therapy for VT at 218 bp with syncope   Past Surgical History:  Procedure Laterality Date   CARDIAC DEFIBRILLATOR PLACEMENT  08/07/2010   SJM Fortify VR ST implanted by Dr Johney Frame, part of the Analyze ST ICD study   DEFIBRILLATOR FUNCTION TEST     FINGER SURGERY  04/05/2007   laceration repair   ICD GENERATOR CHANGEOUT N/A 04/27/2021   Procedure: ICD GENERATOR CHANGEOUT;  Surgeon: Hillis Range, MD;  Location: [facility] INVASIVE CV LAB;  Service: Cardiovascular;  Laterality: N/A;   LEFT HEART CATH AND CORONARY ANGIOGRAPHY N/A 04/01/2022   Procedure: LEFT HEART CATH AND CORONARY ANGIOGRAPHY;  Surgeon: Tonny Bollman, MD;  Location: [facility] INVASIVE CV LAB;  Service: Cardiovascular;  Laterality: N/A;    Allergies  No Known Allergies   History of Present Illness    [name] [name]  is a 62 year old male with a PMH of CAD s/p anterior STEMI 2008 with BMS to LAD, ICM s/p ICD implant 06/2010, HFrEF,  HLD, HTN, GERD who presents today for 16-month follow-up.  Mr. [name] has an extensive coronary history with anterior MI suffered in 2008 that was treated with BMS to LAD with resulting full-thickness scarring within the left ventricle resulting in cardiomyopathy.  TTE was completed showing EF less than 30% and patient was referred to EP for further evaluation.  He continued to develop progressive shortness of breath and had single-chamber ICD placed in 08/2010.  He experienced a single episode of VT in 10/2020 that was treated with ATP.  He completed a 2D echo that showed EF of 25-30% and SGLT2 inhibitor was added to his current medication regimen and 11/2020.  He underwent a generator change out in 04/2021 and was last seen by Dr. Excell Seltzer on 02/12/2022 and [place] Pepper was discontinued due to recurrent yeast infections and patient was doing well with no changes to his cardiac health.  He was seen by Dr. Elberta Fortis on 03/26/2022 after device interrogation showed that patient received a shock while cutting wood.  He was advised to not drive for 6 months and underwent LHC that showed stable single-vessel disease with left-to-right collaterals and widely patent coronaries with dense calcification of LV apex consistent with previous old MI.  Recommendation was made to to continue medical therapy.  Since last being seen in the office patient reports***.  Patient denies chest pain, palpitations, dyspnea, PND, orthopnea, nausea,  vomiting, dizziness, syncope, edema, weight gain, or early satiety.    ***Notes: -No driving since January due to ICD shock Home Medications    Current Outpatient Medications  Medication Sig Dispense Refill   acetaminophen (TYLENOL) 500 MG tablet Take 1,000 mg by mouth every 6 (six) hours as needed for moderate pain.     ARIPiprazole (ABILIFY) 10 MG tablet Take 10 mg by mouth daily as needed (for ptsd).     aspirin 81 MG tablet Take 81 mg by mouth daily.     aspirin-sod bicarb-citric acid  (ALKA-SELTZER) 325 MG TBEF tablet Take 650 mg by mouth every 6 (six) hours as needed (indigestion).     carvedilol (COREG) 25 MG tablet Take 1 tablet (25 mg total) by mouth 2 (two) times daily with a meal. 60 tablet 0   cholecalciferol (VITAMIN D) 1000 UNITS tablet Take 1,000 Units by mouth daily.     diclofenac Sodium (VOLTAREN) 1 % GEL Apply 2 g topically 4 (four) times daily as needed (pain).     fish oil-omega-3 fatty acids 1000 MG capsule Take 1 g by mouth daily.      fluticasone (FLONASE) 50 MCG/ACT nasal spray Place 2 sprays into both nostrils as needed for allergies or rhinitis.     lidocaine (LIDODERM) 5 % Place 1 patch onto the skin daily as needed (pain). Remove & Discard patch within 12 hours or as directed by MD     loratadine (CLARITIN) 10 MG tablet Take 10 mg by mouth daily as needed for allergies.     Multiple Vitamin (MULTIVITAMIN) tablet Take 1 tablet by mouth daily.       nitroGLYCERIN (NITROSTAT) 0.4 MG SL tablet Place 1 tablet (0.4 mg total) under the tongue every 5 (five) minutes x 3 doses as needed for chest pain. 25 tablet 1   omeprazole (PRILOSEC) 20 MG capsule Take 20 mg by mouth as needed (gerd/reflux).     PARoxetine (PAXIL) 40 MG tablet Take 40 mg by mouth daily as needed (for PTSD).     sacubitril-valsartan (ENTRESTO) 97-103 MG Take 1 tablet by mouth 2 (two) times daily. 180 tablet 2   simvastatin (ZOCOR) 80 MG tablet Take 40 mg by mouth at bedtime.     spironolactone (ALDACTONE) 25 MG tablet Take 1 tablet (25 mg total) by mouth daily. 90 tablet 3   terbinafine (LAMISIL) 250 MG tablet Take 1 tablet (250 mg total) by mouth daily. 90 tablet 0   Tetrahydrozoline HCl (VISINE OP) Place 1 drop into both eyes daily as needed (irritation).     vitamin C (ASCORBIC ACID) 500 MG tablet Take 500 mg by mouth daily.     No current facility-administered medications for this visit.     Review of Systems  Please see the history of present illness.    (+)*** (+)***  All other  systems reviewed and are otherwise negative except as noted above.  Physical Exam    Wt Readings from Last 3 Encounters:  04/01/22 240 lb (108.9 kg)  03/26/22 246 lb 3.2 oz (111.7 kg)  02/12/22 246 lb 3.2 oz (111.7 kg)   WU:JWJXB were no vitals filed for this visit.,There is no height or weight on file to calculate BMI.  Constitutional:      Appearance: Healthy appearance. Not in distress.  Neck:     Vascular: JVD normal.  Pulmonary:     Effort: Pulmonary effort is normal.     Breath sounds: No wheezing. No rales. Diminished in the  bases Cardiovascular:     Normal rate. Regular rhythm. Normal S1. Normal S2.      Murmurs: There is no murmur.  Edema:    Peripheral edema absent.  Abdominal:     Palpations: Abdomen is soft non tender. There is no hepatomegaly.  Skin:    General: Skin is warm and dry.  Neurological:     General: No focal deficit present.     Mental Status: Alert and oriented to person, place and time.     Cranial Nerves: Cranial nerves are intact.  EKG/LABS/ Recent Cardiac Studies    ECG personally reviewed by me today - ***  Cardiac Studies & Procedures   CARDIAC CATHETERIZATION  CARDIAC CATHETERIZATION 04/01/2022  Narrative 1.  Single-vessel coronary artery disease with total occlusion of the mid to distal LAD within the previously implanted stents, left to left collaterals present supplying the apical LAD 2.  Widely patent left main, left circumflex, ramus intermedius, and RCA, with minimal irregularity but no significant stenoses 3.  Normal LVEDP, dense calcification of the LV apex noted, consistent with old infarction  Plan: Continued medical therapy  Findings Coronary Findings Diagnostic  Dominance: Right  Left Main The left main is patent with minimal irregularity.  There is no significant stenosis present.  The left main trifurcates into the LAD, left circumflex, and a large intermediate branch.  Left Anterior Descending The proximal LAD is  patent with minimal irregularity.  The vessel is occluded throughout the stented segment in the mid to distal region.  The apical portion of the LAD is collateralized via septal perforators and the ramus intermedius branch (left to left collaterals). Collaterals Dist LAD filled by collaterals from Lat Ramus.  Mid LAD to Dist LAD lesion is 100% stenosed. The lesion was previously treated .  Ramus Intermedius Vessel is large. There is mild diffuse disease throughout the vessel. The intermediate branch is a large-caliber vessel.  It divides into 2 main branches in its midportion.  There is mild plaquing at the bifurcation without any significant stenosis.  Lateral Ramus Intermedius There is mild disease in the vessel.  Left Circumflex The vessel exhibits minimal luminal irregularities. The circumflex is patent with no significant stenosis.  There are minimal irregularities.  The vessel supplies a small posterolateral branch.  Right Coronary Artery The vessel exhibits minimal luminal irregularities. The RCA has a high origin.  There is no significant stenosis throughout the course of the RCA distribution.  The PDA and PLA branches are patent with no stenoses.  Intervention  No interventions have been documented.     ECHOCARDIOGRAM  ECHOCARDIOGRAM COMPLETE 10/31/2020  Narrative ECHOCARDIOGRAM REPORT    Patient Name:   HAMDI KLEY Date of Exam: 10/31/2020 Medical Rec #:  [id number]          Height:       70.0 in Accession #:    [id number]         Weight:       242.6 lb Date of Birth:  [birth year]          BSA:          2.266 m Patient Age:    59 years           BP:           132/76 mmHg Patient Gender: M                  HR:  70 bpm. Exam Location:  [address]  Procedure: 2D Echo, Cardiac Doppler, Color Doppler and Intracardiac Opacification Agent  Indications:    I50.9 CHF  History:        Patient has prior history of Echocardiogram examinations, most recent  06/25/2010. CHF and Cardiomyopathy, CAD and Previous Myocardial Infarction, Arrythmias:Tachycardia; Risk Factors:Family History of Coronary Artery Disease, Hypertension, Dyslipidemia and Former Smoker. Ventricular Tachycardia, Ischemic Cardiomyopathy (Prior EF 25-30%).  Sonographer:    Farrel Conners RDCS Referring Phys: [id number] JAMES ALLRED  IMPRESSIONS   1. Left ventricular ejection fraction, by estimation, is 25 to 30%. The left ventricle has severely decreased function. The left ventricle demonstrates regional wall motion abnormalities (see scoring diagram/findings for description). Left ventricular diastolic parameters are consistent with Grade I diastolic dysfunction (impaired relaxation). There is severe akinesis of the left ventricular, mid-apical anteroseptal wall, apical segment and anterolateral wall. 2. Right ventricular systolic function is normal. The right ventricular size is normal. There is normal pulmonary artery systolic pressure. 3. Right atrial size was mildly dilated. 4. The mitral valve is grossly normal. No evidence of mitral valve regurgitation. 5. The aortic valve is normal in structure. Aortic valve regurgitation is not visualized. No aortic stenosis is present.  FINDINGS Left Ventricle: Left ventricular ejection fraction, by estimation, is 25 to 30%. The left ventricle has severely decreased function. The left ventricle demonstrates regional wall motion abnormalities. Severe akinesis of the left ventricular, mid-apical anteroseptal wall, apical segment and anterolateral wall. Definity contrast agent was given IV to delineate the left ventricular endocardial borders. The left ventricular internal cavity size was normal in size. There is no left ventricular hypertrophy. Left ventricular diastolic parameters are consistent with Grade I diastolic dysfunction (impaired relaxation).  Right Ventricle: The right ventricular size is normal. Right vetricular wall thickness was  not well visualized. Right ventricular systolic function is normal. There is normal pulmonary artery systolic pressure. The tricuspid regurgitant velocity is 2.39 m/s, and with an assumed right atrial pressure of 3 mmHg, the estimated right ventricular systolic pressure is 25.8 mmHg.  Left Atrium: Left atrial size was normal in size.  Right Atrium: Right atrial size was mildly dilated.  Pericardium: There is no evidence of pericardial effusion.  Mitral Valve: The mitral valve is grossly normal. No evidence of mitral valve regurgitation.  Tricuspid Valve: The tricuspid valve is grossly normal. Tricuspid valve regurgitation is trivial.  Aortic Valve: The aortic valve is normal in structure. Aortic valve regurgitation is not visualized. No aortic stenosis is present.  Pulmonic Valve: The pulmonic valve was grossly normal. Pulmonic valve regurgitation is trivial.  Aorta: The aortic root and ascending aorta are structurally normal, with no evidence of dilitation.  IAS/Shunts: The atrial septum is grossly normal.  Additional Comments: A device lead is visualized.   LEFT VENTRICLE PLAX 2D LVIDd:         4.90 cm  Diastology LVIDs:         3.40 cm  LV e' medial:    5.55 cm/s LV PW:         0.95 cm  LV E/e' medial:  11.0 LV IVS:        0.95 cm  LV e' lateral:   4.35 cm/s LVOT diam:     2.40 cm  LV E/e' lateral: 14.0 LV SV:         108 LV SV Index:   48 LVOT Area:     4.52 cm   RIGHT VENTRICLE RV S prime:  11.30 cm/s TAPSE (M-mode): 2.0 cm  LEFT ATRIUM             Index       RIGHT ATRIUM           Index LA diam:        4.00 cm 1.77 cm/m  RA Area:     19.00 cm LA Vol (A2C):   56.3 ml 24.85 ml/m RA Volume:   60.50 ml  26.70 ml/m LA Vol (A4C):   60.0 ml 26.48 ml/m LA Biplane Vol: 61.1 ml 26.97 ml/m AORTIC VALVE LVOT Vmax:   123.00 cm/s LVOT Vmean:  79.200 cm/s LVOT VTI:    0.238 m  AORTA Ao Root diam: 3.10 cm Ao Asc diam:  3.20 cm  MITRAL VALVE                TRICUSPID VALVE MV Area (PHT): cm         TR Peak grad:   22.8 mmHg MV Decel Time: 264 msec    TR Vmax:        239.00 cm/s MV E velocity: 60.80 cm/s MV A velocity: 53.10 cm/s  SHUNTS MV E/A ratio:  1.15        Systemic VTI:  0.24 m Systemic Diam: 2.40 cm  Kristeen Miss MD Electronically signed by Kristeen Miss MD Signature Date/Time: 10/31/2020/11:01:53 AM    Final      CARDIAC MRI  MR CARDIAC MORPHOLOGY W WO CONTRAST 03/26/2010  Narrative Cardiac MRI:  Indication:  Quantitate EF  Protocol:  The patient was scanned on a 1.5 Tesla GE magnet.  A dedicated cardiac coil was used.  Functional imaging was done using Fiesta sequences.  2,3, and 4 chamber views were done to assess RWMA's.  Quantification of EF was done using mass analysis software.  The patient received 42cc of Magnevist.  After 10 minutes inversion recovery sequences were done to assess hyperenhancement.  Findings:  There was moderate to severe LV cavity enlargement.  The mid, and distal anterior and inferior walls as well as the septum and entire apex were thin and akinetic.  Basal function was preserved.  The quantitative EF was 23% ( EDV 215cc ESV 166cc SV 49cc)  There was full thickness scar involve the mid and distal anterior and inferior walls, septum and apex.  There was no mural apical thrombus.  There was mild LAE.  Right sided cardiac chambers were normal .  There was no pericardial effusion.  Impression:  1)    Moderate to severe LV enlargement with mid and distal anterior and inferior, septal and apical akinesis.  Findings would be consistant with wrap around LAD infarct  2)    Full thickness scar involving large area of myocardium described by [facility] above 3)    Quantitative EF 23% 4)    Normal right sided cardiac chambers 5)    No pericardial effusion.  Provider: Council Mechanic         Risk Assessment/Calculations:   {Does this patient have ATRIAL FIBRILLATION?[phone]}        Lab  Results  Component Value Date   WBC 9.1 03/26/2022   HGB 14.1 03/26/2022   HCT 41.3 03/26/2022   MCV 86 03/26/2022   PLT 179 03/26/2022   Lab Results  Component Value Date   CREATININE 1.08 03/26/2022   BUN 14 03/26/2022   NA 141 03/26/2022   K 4.3 03/26/2022   CL 103 03/26/2022   CO2 25 03/26/2022   Lab  Results  Component Value Date   ALT 23 03/13/2022   AST 21 03/13/2022   ALKPHOS 59 03/13/2022   BILITOT 0.2 03/13/2022   Lab Results  Component Value Date   CHOL 121 05/21/2010   HDL 34.60 (L) 05/21/2010   LDLCALC 67 05/21/2010   TRIG 95.0 05/21/2010   CHOLHDL 3 05/21/2010    No results found for: "HGBA1C"   Assessment & Plan    1.  Coronary artery disease: -s/p anterior STEMI in 2008 with BMS to LAD and most recent LHC performed 03/2022 showing stable coronary anatomy. -Today patient reports*** -Continue***  2.  HFrEF/ICM: -Most recent 2D echo completed 10/2020 showing chronically depressed LV function of 25-30% with grade 1 DD, severe akinesis with no valvular abnormalities -Patient suffered ICD shock 03/2022 and underwent repeat LHC that showed no evidence of new ischemia. -Patient was advised to abstain from driving for 6 months -Today patient is***  3.  Essential hypertension: -Patient's blood pressure today was*** -Continue***  4.  Hyperlipidemia: -Patient's LDL cholesterol was*** -Continue***      Disposition: Follow-up with Tonny Bollman, MD or APP in *** months {Are you ordering a CV Procedure (e.g. stress test, cath, DCCV, TEE, etc)?   Press F2        :[id number]}   Medication Adjustments/Labs and Tests Ordered: Current medicines are reviewed at length with the patient today.  Concerns regarding medicines are outlined above.   Signed, Napoleon Form, Leodis Rains, NP 08/11/2022, 3:30 PM [facility]

## 2022-08-12 ENCOUNTER — Encounter: Payer: Self-pay | Admitting: Nurse Practitioner

## 2022-08-12 ENCOUNTER — Ambulatory Visit: Payer: Self-pay | Attending: Nurse Practitioner | Admitting: Nurse Practitioner

## 2022-08-12 VITALS — BP 122/74 | HR 71 | Ht 69.0 in | Wt 248.4 lb

## 2022-08-12 DIAGNOSIS — I10 Essential (primary) hypertension: Secondary | ICD-10-CM

## 2022-08-12 DIAGNOSIS — I255 Ischemic cardiomyopathy: Secondary | ICD-10-CM

## 2022-08-12 DIAGNOSIS — I2510 Atherosclerotic heart disease of native coronary artery without angina pectoris: Secondary | ICD-10-CM

## 2022-08-12 DIAGNOSIS — I5020 Unspecified systolic (congestive) heart failure: Secondary | ICD-10-CM

## 2022-08-12 DIAGNOSIS — E785 Hyperlipidemia, unspecified: Secondary | ICD-10-CM

## 2022-08-12 NOTE — Patient Instructions (Signed)
Medication Instructions:  Your physician recommends that you continue on your current medications as directed. Please refer to the Current Medication list given to you today. *If you need a refill on your cardiac medications before your next appointment, please call your pharmacy*   Lab Work: None ordered  Testing/Procedures: None ordered   Follow-Up: At [facility], you and your health needs are our priority.  As part of our continuing mission to provide you with exceptional heart care, we have created designated Provider Care Teams.  These Care Teams include your primary Cardiologist (physician) and Advanced Practice Providers (APPs -  Physician Assistants and Nurse Practitioners) who all work together to provide you with the care you need, when you need it.  We recommend signing up for the patient portal called "MyChart".  Sign up information is provided on this After Visit Summary.  MyChart is used to connect with patients for Virtual Visits (Telemedicine).  Patients are able to view lab/test results, encounter notes, upcoming appointments, etc.  Non-urgent messages can be sent to your provider as well.   To learn more about what you can do with MyChart, go to [email or web address].    Your next appointment:   7 month(s)  Provider:   Tonny Bollman, MD     Other Instructions Please check your weight daily. Please contact the office if you gain more than 3lbs in a day or 5lbs in a week. Limit your salt intake to 1500-2000mg  per day or 500mg  of Sodium per meal.

## 2022-08-19 NOTE — Progress Notes (Deleted)
  Electrophysiology Office Note:   ID:  [name] [name], [name] [name] 10-Jan-1961, MRN [id number]  Primary Cardiologist: Tonny Bollman, MD Electrophysiologist: Hillis Range, MD (Inactive) *** {Click to update primary MD,subspecialty MD or APP then REFRESH:1}    Subjective   History of Present Illness:   [name] [name] is a 62 y.o. male with h/o *** seen today for {VISITTYPE:28148}  Review of systems complete and found to be negative unless listed in HPI.     Objective  Device History: {INDUSTRY:28136} {Blank single:19197::"Dual Chamber","Single Chamber","BiV","S-ICD"} ICD implanted *** for *** History of appropriate therapy: {yes/no:20286} History of AAD therapy: {Blank single:19197::"No","Yes; currently on ***","Yes; previously tolerated ***"}    Studies Reviewed:    ICD Interrogation-  reviewed in detail today,  See PACEART report.  {EKGtoday:28818::"EKG is not ordered today"}   Physical Exam:   VS:  There were no vitals taken for this visit.   Wt Readings from Last 3 Encounters:  08/12/22 248 lb 6.4 oz (112.7 kg)  04/01/22 240 lb (108.9 kg)  03/26/22 246 lb 3.2 oz (111.7 kg)     GEN: Well nourished, well developed in no acute distress NECK: No JVD; No carotid bruits CARDIAC: {EPRHYTHM:28826}, no murmurs, rubs, gallops RESPIRATORY:  Clear to auscultation without rales, wheezing or rhonchi  ABDOMEN: Soft, non-tender, non-distended EXTREMITIES:  No edema; No deformity     ASSESSMENT AND PLAN:    Chronic systolic dysfunction s/p {INDUSTRY:28136} {Blank single:19197::"***","single chamber ICD","dual chamber ICD","CRT-D","S-ICD"}  euvolemic today Stable on an appropriate medical regimen Normal ICD function See Pace Art report No changes today  Disposition:   Follow up with {EPPROVIDERS:28135} {EPFOLLOW UP:28173}   Signed, Graciella Freer, PA-C

## 2022-08-21 NOTE — Progress Notes (Deleted)
Electrophysiology Office Note   Date:  08/21/2022   ID:  [name] [name], DOB Apr 18, 1960, MRN [id number]  PCP:  [facility]  Cardiologist:  Excell Seltzer Primary Electrophysiologist:  Graciella Freer, PA-C    Chief Complaint: ICD  Subjective     History of Present Illness: [name] [name] is a 62 y.o. male who is being seen today for the evaluation of ICD at the request of [facility]. Presenting today for electrophysiology evaluation.  He has a history significant for coronary artery disease status post anterior MI in 2008, chronic systolic heart failure, hypertension, hyperlipidemia, ventricular tachycardia.  This past weekend, he was cutting wood.  He was working in the cold.  He was taking the wood back into his house and had a pop sensation.  Device interrogation shows that he received an ICD shock.  He did get ATP prior to the shock did not convert him from ventricular tachycardia.  He has not had chest pain or shortness of breath.  This is his first shock in the last few years.  Today, he denies symptoms of palpitations, chest pain, shortness of breath, orthopnea, PND, lower extremity edema, claudication, dizziness, presyncope, syncope, bleeding, or neurologic sequela. The patient is tolerating medications without difficulties.      Objective    Past Medical History:  Diagnosis Date   CAD (coronary artery disease)    anterior MI 2008, treated with bare metal stent to LAD   Chronic systolic heart failure (HCC) 06/27/2010   Ischemic CM s/p ICD // Large anterior MI in 2008 tx with BMS to LAD // cMRI 6/10:  Ant, inf, septal, apical AK with full thickness scar, EF 23% // Echo 4/12: Inferoapical, apical, septal and dist ant AK, inferobasal HK, mild LVH, EF 25-30%, mild MR, mild LAE    GERD (gastroesophageal reflux disease)    HLD (hyperlipidemia)    HTN (hypertension)    Ischemic cardiomyopathy    NYHA Class II/III CHF   MI (myocardial  infarction) (HCC)    Pericarditis; 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   Umbilical hernia    Ventricular tachycardia (HCC) 07/29/2018   received appropriate ATP therapy for VT at 218 bp with syncope   Past Surgical History:  Procedure Laterality Date   CARDIAC DEFIBRILLATOR PLACEMENT  08/07/2010   SJM Fortify VR ST implanted by Dr Johney Frame, part of the Analyze ST ICD study   DEFIBRILLATOR FUNCTION TEST     FINGER SURGERY  04/05/2007   laceration repair   ICD GENERATOR CHANGEOUT N/A 04/27/2021   Procedure: ICD GENERATOR CHANGEOUT;  Surgeon: Hillis Range, MD;  Location: [facility] INVASIVE CV LAB;  Service: Cardiovascular;  Laterality: N/A;   LEFT HEART CATH AND CORONARY ANGIOGRAPHY N/A 04/01/2022   Procedure: LEFT HEART CATH AND CORONARY ANGIOGRAPHY;  Surgeon: Tonny Bollman, MD;  Location: [facility] INVASIVE CV LAB;  Service: Cardiovascular;  Laterality: N/A;     Current Outpatient Medications  Medication Sig Dispense Refill   acetaminophen (TYLENOL) 500 MG tablet Take 1,000 mg by mouth every 6 (six) hours as needed for moderate pain.     ARIPiprazole (ABILIFY) 10 MG tablet Take 10 mg by mouth daily as needed (for ptsd).     aspirin EC 325 MG tablet Take 1 tablet by mouth daily.     aspirin-sod bicarb-citric acid (ALKA-SELTZER) 325 MG TBEF tablet Take 650 mg by mouth every 6 (six) hours as needed (indigestion).     carvedilol (COREG) 25 MG tablet Take  1 tablet (25 mg total) by mouth 2 (two) times daily with a meal. 60 tablet 0   cholecalciferol (VITAMIN D) 1000 UNITS tablet Take 1,000 Units by mouth daily.     diclofenac Sodium (VOLTAREN) 1 % GEL Apply 2 g topically 4 (four) times daily as needed (pain).     fish oil-omega-3 fatty acids 1000 MG capsule Take 1 g by mouth daily.      fluticasone (FLONASE) 50 MCG/ACT nasal spray Place 2 sprays into both nostrils as needed for allergies or rhinitis.     lidocaine (LIDODERM) 5 % Place 1 patch onto the skin daily as needed (pain). Remove & Discard patch within 12  hours or as directed by MD     loratadine (CLARITIN) 10 MG tablet Take 10 mg by mouth daily as needed for allergies.     Multiple Vitamin (MULTIVITAMIN) tablet Take 1 tablet by mouth daily.       nitroGLYCERIN (NITROSTAT) 0.4 MG SL tablet Place 1 tablet (0.4 mg total) under the tongue every 5 (five) minutes x 3 doses as needed for chest pain. 25 tablet 1   omeprazole (PRILOSEC) 20 MG capsule Take 20 mg by mouth as needed (gerd/reflux).     PARoxetine (PAXIL) 40 MG tablet Take 40 mg by mouth daily as needed (for PTSD).     sacubitril-valsartan (ENTRESTO) 97-103 MG Take 1 tablet by mouth 2 (two) times daily. 180 tablet 2   simvastatin (ZOCOR) 80 MG tablet Take 40 mg by mouth at bedtime.     spironolactone (ALDACTONE) 25 MG tablet Take 1 tablet (25 mg total) by mouth daily. 90 tablet 3   terbinafine (LAMISIL) 250 MG tablet Take 1 tablet (250 mg total) by mouth daily. 90 tablet 0   Tetrahydrozoline HCl (VISINE OP) Place 1 drop into both eyes daily as needed (irritation).     vitamin C (ASCORBIC ACID) 500 MG tablet Take 500 mg by mouth daily.     No current facility-administered medications for this visit.    Allergies:   Patient has no known allergies.   Social History:  The patient  reports that he quit smoking about 16 years ago. His smoking use included cigars. He has never used smokeless tobacco. He reports current alcohol use. He reports that he does not use drugs.   Family History:  The patient's family history includes Diabetes in his mother and unknown relative; Heart Problems in his father; Heart failure in his unknown relative; Hypertension in his mother.    ROS:  Please see the history of present illness.   Otherwise, review of systems is positive for none.   All other systems are reviewed and negative.    PHYSICAL EXAM: VS:  There were no vitals taken for this visit. , BMI There is no height or weight on file to calculate BMI. GEN: Well nourished, well developed, in no acute  distress  HEENT: normal  Neck: no JVD, carotid bruits, or masses Cardiac: RRR; no murmurs, rubs, or gallops,no edema  Respiratory:  clear to auscultation bilaterally, normal work of breathing GI: soft, nontender, nondistended, + BS MS: no deformity or atrophy  Skin: warm and dry, device pocket is well healed Neuro:  Strength and sensation are intact Psych: euthymic mood, full affect  EKG:  EKG is ordered today. Personal review of the ekg ordered shows Ennis rhythm, rate 70, PVC, inferior Q waves  Device interrogation is reviewed today in detail.  See PaceArt for details.   Recent Labs: 03/13/2022:  ALT 23 03/26/2022: BUN 14; Creatinine, Ser 1.08; Hemoglobin 14.1; Platelets 179; Potassium 4.3; Sodium 141    Lipid Panel     Component Value Date/Time   CHOL 121 05/21/2010 1139   TRIG 95.0 05/21/2010 1139   HDL 34.60 (L) 05/21/2010 1139   CHOLHDL 3 05/21/2010 1139   VLDL 19.0 05/21/2010 1139   LDLCALC 67 05/21/2010 1139     Wt Readings from Last 3 Encounters:  08/12/22 248 lb 6.4 oz (112.7 kg)  04/01/22 240 lb (108.9 kg)  03/26/22 246 lb 3.2 oz (111.7 kg)      Other studies Reviewed: Additional studies/ records that were reviewed today include: TTE 10/31/20  Review of the above records today demonstrates:   1. Left ventricular ejection fraction, by estimation, is 25 to 30%. The  left ventricle has severely decreased function. The left ventricle  demonstrates regional wall motion abnormalities (see scoring  diagram/findings for description). Left ventricular  diastolic parameters are consistent with Grade I diastolic dysfunction  (impaired relaxation). There is severe akinesis of the left ventricular,  mid-apical anteroseptal wall, apical segment and anterolateral wall.   2. Right ventricular systolic function is normal. The right ventricular  size is normal. There is normal pulmonary artery systolic pressure.   3. Right atrial size was mildly dilated.   4. The mitral  valve is grossly normal. No evidence of mitral valve  regurgitation.   5. The aortic valve is normal in structure. Aortic valve regurgitation is  not visualized. No aortic stenosis is present.       ASSESSMENT AND PLAN:  1.  Chronic systolic heart failure: Currently on optimal medical therapy per primary cardiology.  Status post ICD.  Device function appropriate.  No changes.  2.  Coronary artery disease: No current chest pain.  Plan per primary cardiology.  3.  Ventricular tachycardia: Had an episode of VT requiring ATP and ICD shock therapy over the weekend.  He has been advised of no driving for the next 6 months.  He had the episode when he was exerting himself by cutting firewood he did not have much chest pain, though I do think he would benefit from left heart catheterization.  This was discussed with his primary cardiologist.  Risk and benefits have been discussed.  He understands the risks and is agreed to the procedure.  The patient understands that risks include but are not limited to stroke (1 in 1000), death (1 in 1000), kidney failure [usually temporary] (1 in 500), bleeding (1 in 200), allergic reaction [possibly serious] (1 in 200), and agrees to proceed.  4.  Hypertension:well controlled  Current medicines are reviewed at length with the patient today.   The patient does not have concerns regarding his medicines.  The following changes were made today:  none  Labs/ tests ordered today include:  No orders of the defined types were placed in this encounter.    Disposition:   FU with Will [name] 6 months  Signed, Graciella Freer, PA-C  08/21/2022 4:14 PM     [facility] [address] [place] [address] [phone] (office) [phone] (fax)

## 2022-08-23 NOTE — Progress Notes (Signed)
Remote ICD transmission.   

## 2022-08-24 NOTE — Progress Notes (Unsigned)
  Electrophysiology Office Note:   ID:  Keylon, Labelle August 10, 1960, MRN [id number]  Primary Cardiologist: Tonny Bollman, MD Electrophysiologist: Regan Lemming, MD  {Click to update primary MD,subspecialty MD or APP then REFRESH:1}    History of Present Illness:   TREVOR WILKIE is a 62 y.o. male with h/o CAD, chronic systolic CHF, h/o VT, HTN seen today for routine electrophysiology followup.   Seen by Dr. Elberta Fortis 03/2022 and had received an ICD shock while chopping wood. Underwent cath as below with occluded mid/distal LAD within prior stents. Medical therapy.  Since last being seen in our clinic the patient reports doing ***.  he denies chest pain, palpitations, dyspnea, PND, orthopnea, nausea, vomiting, dizziness, syncope, edema, weight gain, or early satiety.   Review of systems complete and found to be negative unless listed in HPI.   Device History: St. Jude Single Chamber ICD implanted 08/07/2010 for Chronic systolic CHF History of appropriate therapy: Yes History of AAD therapy: No      Studies Reviewed:    ICD Interrogation-  reviewed in detail today,  See PACEART report.  {EKGtoday:28818}   LHC 04/01/2022 1.  Single-vessel coronary artery disease with total occlusion of the mid to distal LAD within the previously implanted stents, left to left collaterals present supplying the apical LAD 2.  Widely patent left main, left circumflex, ramus intermedius, and RCA, with minimal irregularity but no significant stenoses 3.  Normal LVEDP, dense calcification of the LV apex noted, consistent with old infarction  Risk Assessment/Calculations:   {Does this patient have ATRIAL FIBRILLATION?:[phone]} No BP recorded.  {Refresh Note OR Click here to enter BP  :1}***        Physical Exam:   VS:  There were no vitals taken for this visit.   Wt Readings from Last 3 Encounters:  08/12/22 248 lb 6.4 oz (112.7 kg)  04/01/22 240 lb (108.9 kg)  03/26/22 246 lb 3.2 oz  (111.7 kg)     GEN: Well nourished, well developed in no acute distress NECK: No JVD; No carotid bruits CARDIAC: {EPRHYTHM:28826}, no murmurs, rubs, gallops RESPIRATORY:  Clear to auscultation without rales, wheezing or rhonchi  ABDOMEN: Soft, non-tender, non-distended EXTREMITIES:  No edema; No deformity   ASSESSMENT AND PLAN:    Chronic systolic dysfunction s/p Abbott single chamber ICD  euvolemic today Stable on an appropriate medical regimen Normal ICD function See Pace Art report No changes today Continue coreg 25 mg BID, continue Entresto 97/103, Continue spiro 25  CAD Denies s/s ischemia  H/o VT Quiescent *** Driving restriction will end soon with no further therapies   HTN Stable on current regimen   Disposition:   Follow up with {EPPROVIDERS:28135} {EPFOLLOW UP:28173}   Signed, Graciella Freer, PA-C

## 2022-08-26 ENCOUNTER — Encounter: Payer: Self-pay | Admitting: Student

## 2022-08-26 ENCOUNTER — Ambulatory Visit: Payer: Self-pay | Attending: Student | Admitting: Student

## 2022-08-26 VITALS — BP 112/70 | HR 68 | Ht 69.0 in | Wt 246.6 lb

## 2022-08-26 DIAGNOSIS — I255 Ischemic cardiomyopathy: Secondary | ICD-10-CM

## 2022-08-26 DIAGNOSIS — I5020 Unspecified systolic (congestive) heart failure: Secondary | ICD-10-CM

## 2022-08-26 DIAGNOSIS — I2510 Atherosclerotic heart disease of native coronary artery without angina pectoris: Secondary | ICD-10-CM

## 2022-08-26 DIAGNOSIS — I472 Ventricular tachycardia, unspecified: Secondary | ICD-10-CM

## 2022-08-26 LAB — CUP PACEART INCLINIC DEVICE CHECK
Date Time Interrogation Session: 20240624081938
Implantable Pulse Generator Implant Date: 20230224
Pulse Gen Serial Number: 111056600

## 2022-08-26 NOTE — Patient Instructions (Signed)
Medication Instructions:  Your physician recommends that you continue on your current medications as directed. Please refer to the Current Medication list given to you today.  *If you need a refill on your cardiac medications before your next appointment, please call your pharmacy*  Lab Work: BMET-TODAY If you have labs (blood work) drawn today and your tests are completely normal, you will receive your results only by: MyChart Message (if you have MyChart) OR A paper copy in the mail If you have any lab test that is abnormal or we need to change your treatment, we will call you to review the results.  Follow-Up: At [facility], you and your health needs are our priority.  As part of our continuing mission to provide you with exceptional heart care, we have created designated Provider Care Teams.  These Care Teams include your primary Cardiologist (physician) and Advanced Practice Providers (APPs -  Physician Assistants and Nurse Practitioners) who all work together to provide you with the care you need, when you need it.  Your next appointment:   6 month(s)  Provider:   Loman Brooklyn, MD

## 2022-08-27 LAB — BASIC METABOLIC PANEL
BUN/Creatinine Ratio: 10 (ref 10–24)
BUN: 10 mg/dL (ref 8–27)
CO2: 24 mmol/L (ref 20–29)
Calcium: 9.4 mg/dL (ref 8.6–10.2)
Chloride: 102 mmol/L (ref 96–106)
Creatinine, Ser: 0.99 mg/dL (ref 0.76–1.27)
Glucose: 100 mg/dL — ABNORMAL HIGH (ref 70–99)
Potassium: 4.5 mmol/L (ref 3.5–5.2)
Sodium: 140 mmol/L (ref 134–144)
eGFR: 86 mL/min/{1.73_m2} (ref >59)

## 2022-09-09 ENCOUNTER — Ambulatory Visit: Payer: Self-pay | Attending: Cardiology

## 2022-09-09 DIAGNOSIS — Z95810 Presence of automatic (implantable) cardiac defibrillator: Secondary | ICD-10-CM

## 2022-09-09 DIAGNOSIS — I5022 Chronic systolic (congestive) heart failure: Secondary | ICD-10-CM

## 2022-09-11 ENCOUNTER — Telehealth: Payer: Self-pay

## 2022-09-11 NOTE — Telephone Encounter (Signed)
Remote ICM transmission received.  Attempted call to patient regarding ICM remote transmission and left detailed message per DPR.  Left ICM phone number and advised to return call for any fluid symptoms or questions. Next ICM remote transmission scheduled 10/14/2022.    

## 2022-09-11 NOTE — Progress Notes (Signed)
EPIC Encounter for ICM Monitoring  Patient Name: [name] [name] is a 62 y.o. male Date: 09/11/2022 Primary Care Physican: [facility] Primary Cardiologist: Excell Seltzer    Electrophysiologist: Elberta Fortis 11/12/2021 Weight: 231 lbs    04/26/2022 Weight: 241-242 lbs    07/05/2022 Weight: 240 lbs    08/08/2022 Weight: 240 lbs                               Attempted call to patient and unable to reach.  Left detailed message per DPR regarding transmission. Transmission reviewed.      CorVue thoracic impedance suggesting normal fluid levels with the exception of possible fluid accumulation 6/25-7/6.   Prescribed:  Spironolactone 25 mg take 1 tablet daily   Labs: 08/26/2022 Creatinine 0.99, BUN 10, Potassium 4.5, Sodium 140, GFR 86  A complete set of results can be found in Results Review.  Recommendations:   Left voice mail with ICM number and encouraged to call if experiencing any fluid symptoms.   Follow-up plan: ICM clinic phone appointment on 10/14/2022.   91 day device clinic remote transmission 10/29/2022.     EP/Cardiology Office Visits:  02/28/2023 with Dr Elberta Fortis.  Recall 03/19/2023 with Dr Randolm Idol APP.   Copy of ICM check sent to Dr. Elberta Fortis.    3 month ICM trend: 09/09/2022.    12-14 Month ICM trend:     Karie Soda, RN 09/11/2022 8:45 AM

## 2022-10-04 ENCOUNTER — Ambulatory Visit: Payer: No Typology Code available for payment source | Admitting: Podiatry

## 2022-10-14 ENCOUNTER — Ambulatory Visit: Payer: Self-pay | Attending: Cardiology

## 2022-10-14 DIAGNOSIS — Z95810 Presence of automatic (implantable) cardiac defibrillator: Secondary | ICD-10-CM

## 2022-10-14 DIAGNOSIS — I5022 Chronic systolic (congestive) heart failure: Secondary | ICD-10-CM

## 2022-10-16 NOTE — Progress Notes (Signed)
EPIC Encounter for ICM Monitoring  Patient Name: [name] [name] is a 62 y.o. male Date: 10/16/2022 Primary Care Physican: [facility] Primary Cardiologist: Excell Seltzer    Electrophysiologist: Elberta Fortis 11/12/2021 Weight: 231 lbs    04/26/2022 Weight: 241-242 lbs    07/05/2022 Weight: 240 lbs    08/08/2022 Weight: 240 lbs                               Spoke with patient and heart failure questions reviewed.  Transmission results reviewed.  Pt asymptomatic for fluid accumulation.  Reports feeling well at this time and voices no complaints.  He reports decreasing fluid intake.   CorVue thoracic impedance suggesting possible dryness starting 8/7.    Prescribed:  Spironolactone 25 mg take 1 tablet daily   Labs: 08/26/2022 Creatinine 0.99, BUN 10, Potassium 4.5, Sodium 140, GFR 86  A complete set of results can be found in Results Review.   Recommendations:   Advised to drink approximately 64 oz fluid daily to help with hydration.  No changes and encouraged to call if experiencing any fluid symptoms.   Follow-up plan: ICM clinic phone appointment on 11/18/2022.   91 day device clinic remote transmission 10/29/2022.     EP/Cardiology Office Visits:  02/28/2023 with Dr Elberta Fortis.  Recall 03/19/2023 with Dr Randolm Idol APP.   Copy of ICM check sent to Dr. Elberta Fortis.    3 month ICM trend: 10/14/2022.    12-14 Month ICM trend:     Karie Soda, RN 10/16/2022 2:14 PM

## 2022-10-29 ENCOUNTER — Ambulatory Visit (INDEPENDENT_AMBULATORY_CARE_PROVIDER_SITE_OTHER): Payer: Self-pay

## 2022-10-29 DIAGNOSIS — I255 Ischemic cardiomyopathy: Secondary | ICD-10-CM

## 2022-10-30 LAB — CUP PACEART REMOTE DEVICE CHECK
Battery Remaining Longevity: 104 mo
Battery Remaining Percentage: 84 %
Battery Voltage: 3.01 V
Brady Statistic RV Percent Paced: 1 %
Date Time Interrogation Session: 20240827020017
HighPow Impedance: 65 Ohm
Implantable Pulse Generator Implant Date: 20230224
Lead Channel Impedance Value: 440 Ohm
Lead Channel Pacing Threshold Amplitude: 1.25 V
Lead Channel Pacing Threshold Pulse Width: 0.5 ms
Lead Channel Sensing Intrinsic Amplitude: 11.6 mV
Lead Channel Setting Pacing Amplitude: 2.5 V
Lead Channel Setting Pacing Pulse Width: 0.5 ms
Lead Channel Setting Sensing Sensitivity: 0.5 mV
Pulse Gen Serial Number: 111056600
Zone Setting Status: 755011

## 2022-11-08 NOTE — Progress Notes (Signed)
Remote ICD transmission.   

## 2022-11-18 ENCOUNTER — Ambulatory Visit: Payer: Self-pay | Attending: Cardiology

## 2022-11-18 DIAGNOSIS — Z95810 Presence of automatic (implantable) cardiac defibrillator: Secondary | ICD-10-CM

## 2022-11-18 DIAGNOSIS — I5022 Chronic systolic (congestive) heart failure: Secondary | ICD-10-CM

## 2022-11-21 ENCOUNTER — Telehealth: Payer: Self-pay

## 2022-11-21 NOTE — Progress Notes (Signed)
EPIC Encounter for ICM Monitoring  Patient Name: [name] [name] is a 62 y.o. male Date: 11/21/2022 Primary Care Physican: [facility] Primary Cardiologist: Excell Seltzer    Electrophysiologist: Elberta Fortis 11/12/2021 Weight: 231 lbs    04/26/2022 Weight: 241-242 lbs    07/05/2022 Weight: 240 lbs    08/08/2022 Weight: 240 lbs                               Attempted call to patient and unable to reach.  Left detailed message per DPR regarding transmission. Transmission reviewed. .   CorVue thoracic impedance suggesting normal fluid levels with the exception of possible fluid accumulation from 8/20-8/27.    Prescribed:  Spironolactone 25 mg take 1 tablet daily   Labs: 08/26/2022 Creatinine 0.99, BUN 10, Potassium 4.5, Sodium 140, GFR 86  A complete set of results can be found in Results Review.   Recommendations:   Left voice mail with ICM number and encouraged to call if experiencing any fluid symptoms..   Follow-up plan: ICM clinic phone appointment on 12/23/2022.   91 day device clinic remote transmission 01/28/2023.     EP/Cardiology Office Visits:  02/28/2023 with Dr Elberta Fortis.  Recall 03/19/2023 with Dr Earmon Phoenix APP.   Copy of ICM check sent to Dr. Elberta Fortis.     3 month ICM trend: 11/18/2022.    12-14 Month ICM trend:     Karie Soda, RN 11/21/2022 1:21 PM

## 2022-11-21 NOTE — Telephone Encounter (Signed)
Remote ICM transmission received.  Attempted call to patient regarding ICM remote transmission and left detailed message per DPR.  Left ICM phone number and advised to return call for any fluid symptoms or questions. Next ICM remote transmission scheduled 12/23/2022.

## 2022-12-01 ENCOUNTER — Emergency Department: Payer: Non-veteran care

## 2022-12-01 ENCOUNTER — Other Ambulatory Visit: Payer: Self-pay

## 2022-12-01 ENCOUNTER — Emergency Department
Admission: EM | Admit: 2022-12-01 | Discharge: 2022-12-02 | Disposition: A | Discharge status: Home / Self Care | Payer: No Typology Code available for payment source | Attending: Emergency Medicine | Admitting: Emergency Medicine

## 2022-12-01 DIAGNOSIS — I110 Hypertensive heart disease with heart failure: Secondary | ICD-10-CM | POA: Insufficient documentation

## 2022-12-01 DIAGNOSIS — I509 Heart failure, unspecified: Secondary | ICD-10-CM | POA: Diagnosis not present

## 2022-12-01 DIAGNOSIS — I472 Ventricular tachycardia, unspecified: Secondary | ICD-10-CM | POA: Insufficient documentation

## 2022-12-01 DIAGNOSIS — I2510 Atherosclerotic heart disease of native coronary artery without angina pectoris: Secondary | ICD-10-CM | POA: Diagnosis not present

## 2022-12-01 DIAGNOSIS — R079 Chest pain, unspecified: Secondary | ICD-10-CM | POA: Diagnosis present

## 2022-12-01 LAB — CBC
HCT: 45.9 % (ref 39.0–52.0)
Hemoglobin: 14.7 g/dL (ref 13.0–17.0)
MCH: 28.9 pg (ref 26.0–34.0)
MCHC: 32 g/dL (ref 30.0–36.0)
MCV: 90.2 fL (ref 80.0–100.0)
Platelets: 163 10*3/uL (ref 150–400)
RBC: 5.09 MIL/uL (ref 4.22–5.81)
RDW: 12.7 % (ref 11.5–15.5)
WBC: 12.7 10*3/uL — ABNORMAL HIGH (ref 4.0–10.5)
nRBC: 0 % (ref 0.0–0.2)

## 2022-12-01 LAB — TROPONIN I (HIGH SENSITIVITY): Troponin I (High Sensitivity): 14 ng/L (ref <18)

## 2022-12-01 MED ORDER — AMIODARONE HCL IN DEXTROSE 360-4.14 MG/200ML-% IV SOLN: 30.0000 mg/h | INTRAVENOUS | Status: DC

## 2022-12-01 MED ORDER — ETOMIDATE 2 MG/ML IV SOLN
30.0000 mg | Freq: Once | INTRAVENOUS | Status: AC
  Administered 2022-12-01: 30 mg via INTRAVENOUS
  Filled 2022-12-01: qty 20

## 2022-12-01 MED ORDER — AMIODARONE IV BOLUS ONLY 150 MG/100ML
150.0000 mg | Freq: Once | INTRAVENOUS | Status: AC
  Administered 2022-12-01: 150 mg via INTRAVENOUS
  Filled 2022-12-01: qty 100

## 2022-12-01 MED ORDER — AMIODARONE LOAD VIA INFUSION
150.0000 mg | Freq: Once | INTRAVENOUS | Status: DC
Start: 2022-12-01 — End: 2022-12-01

## 2022-12-01 MED ORDER — AMIODARONE HCL IN DEXTROSE 360-4.14 MG/200ML-% IV SOLN
60.0000 mg/h | INTRAVENOUS | Status: DC
  Administered 2022-12-01: 60 mg/h via INTRAVENOUS
  Filled 2022-12-01: qty 200

## 2022-12-01 MED ORDER — AMIODARONE HCL IN DEXTROSE 360-4.14 MG/200ML-% IV SOLN
30.0000 mg/h | INTRAVENOUS | Status: DC
Start: 2022-12-02 — End: 2022-12-01

## 2022-12-01 MED ORDER — AMIODARONE LOAD VIA INFUSION
1.0000 mg | Freq: Once | INTRAVENOUS | Status: AC
  Administered 2022-12-01: 1 mg via INTRAVENOUS
  Filled 2022-12-01: qty 0.56

## 2022-12-01 MED ORDER — AMIODARONE HCL IN DEXTROSE 360-4.14 MG/200ML-% IV SOLN
60.0000 mg/h | INTRAVENOUS | Status: DC
Start: 2022-12-01 — End: 2022-12-01

## 2022-12-01 MED ORDER — FENTANYL CITRATE PF 50 MCG/ML IJ SOSY
50.0000 ug | PREFILLED_SYRINGE | Freq: Once | INTRAMUSCULAR | Status: AC
  Administered 2022-12-01: 50 ug via INTRAVENOUS
  Filled 2022-12-01: qty 1

## 2022-12-01 NOTE — ED Triage Notes (Signed)
Pt to ed from home via POV for chest pain. Pt is caox4, in no acute distress and ambulatory. Pt HR is 198 currently. Moved straight to room. Pt advised this has never happened before. Pt does have a defib and it has fired before but has not fired [employer].

## 2022-12-01 NOTE — Sedation Documentation (Signed)
Shock sync 200 joules. NSR post shock.

## 2022-12-01 NOTE — ED Provider Notes (Signed)
[facility] Provider Note    Event Date/Time   First MD Initiated Contact with Patient 12/01/22 2130     (approximate)   History   Chief Complaint: Chest Pain   HPI  [name] [name] is a 62 y.o. male with a past history of hypertension, CAD, CHF status post ICD who comes ED complaining of of heart racing that started at 8:00 PM tonight while at rest.  Denies any recent exertional symptoms.  No chest pain or shortness of breath.  No fever.  No recent illness.  Normal oral intake.     Physical Exam   Triage Vital Signs: ED Triage Vitals [12/01/22 2129]  Encounter Vitals Group     BP      Systolic BP Percentile      Diastolic BP Percentile      Pulse Rate (!) 190     Resp 16     Temp      Temp src      SpO2 98 %     Weight      Height      Head Circumference      Peak Flow      Pain Score 10     Pain Loc      Pain Education      Exclude from Growth Chart     Most recent vital signs: Vitals:   12/01/22 2315 12/02/22 0014  BP: 108/71 104/69  Pulse: 81 74  Resp: 17 18  Temp:  (!) 97.1 F (36.2 C)  SpO2: 96% 99%    General: Awake, no distress.  CV:  Good peripheral perfusion.  Tachycardia, heart rate 200 Resp:  Normal effort.  Clear to auscultation bilaterally Abd:  No distention.  Soft nontender Other:  Moist oral mucosa   ED Results / Procedures / Treatments   Labs (all labs ordered are listed, but only abnormal results are displayed) Labs Reviewed  CBC - Abnormal; Notable for the following components:      Result Value   WBC 12.7 (*)    All other components within normal limits  PROTIME-INR  COMPREHENSIVE METABOLIC PANEL  MAGNESIUM  PHOSPHORUS  TSH  TROPONIN I (HIGH SENSITIVITY)  TROPONIN I (HIGH SENSITIVITY)     EKG Interpreted by me Monomorphic ventricular tachycardia, right axis, heart rate of 202.  No identifiable ischemic changes.   RADIOLOGY Chest x-ray interpreted by me, negative for pleural effusion,  edema, or consolidation.  Radiology report reviewed noting 3 cm masslike density in the right upper lung.   PROCEDURES:  .Critical Care  Performed by: Sharman Cheek, MD Authorized by: Sharman Cheek, MD   Critical care provider statement:    Critical care time (minutes):  35   Critical care time was exclusive of:  Separately billable procedures and treating other patients   Critical care was necessary to treat or prevent imminent or life-threatening deterioration of the following conditions:  Cardiac failure   Critical care was time spent personally by me on the following activities:  Development of treatment plan with patient or surrogate, discussions with consultants, evaluation of patient's response to treatment, examination of patient, obtaining history from patient or surrogate, ordering and performing treatments and interventions, ordering and review of laboratory studies, ordering and review of radiographic studies, pulse oximetry, re-evaluation of patient's condition and review of old charts   Care discussed with: admitting provider   Comments:        .Cardioversion  Date/Time: 12/02/2022 12:13 AM  Performed by: Sharman Cheek, MD Authorized by: Sharman Cheek, MD   Consent:    Consent obtained:  Verbal, written and emergent situation   Consent given by:  Patient   Risks discussed:  Cutaneous burn, death, induced arrhythmia and pain   Alternatives discussed:  Rate-control medication Pre-procedure details:    Cardioversion basis:  Emergent   Rhythm:  Ventricular tachycardia   Electrode placement:  Anterior-posterior Patient sedated: Yes. Refer to sedation procedure documentation for details of sedation.  Attempt one:    Cardioversion mode:  Synchronous   Waveform:  Biphasic   Shock (Joules):  200   Shock outcome:  Conversion to normal sinus rhythm Post-procedure details:    Patient status:  Awake   Patient tolerance of procedure:  Tolerated well, no  immediate complications Comments:         .Sedation  Date/Time: 12/02/2022 12:14 AM  Performed by: Sharman Cheek, MD Authorized by: Sharman Cheek, MD   Consent:    Consent obtained:  Verbal and written   Consent given by:  Patient   Risks discussed:  Allergic reaction, inadequate sedation, nausea, vomiting and respiratory compromise necessitating ventilatory assistance and intubation   Alternatives discussed:  Analgesia without sedation Universal protocol:    Immediately prior to procedure, a time out was called: yes     Patient identity confirmed:  Arm band Indications:    Procedure performed:  Cardioversion Pre-sedation assessment:    Time since last food or drink:  3hours   NPO status caution: urgency dictates proceeding with non-ideal NPO status     ASA classification: class 3 - patient with severe systemic disease     Mouth opening:  3 or more finger widths   Thyromental distance:  4 finger widths   Mallampati score:  II - soft palate, uvula, fauces visible   Neck mobility: normal     Pre-sedation assessments completed and reviewed: airway patency, cardiovascular function, hydration status, mental status, nausea/vomiting, pain level and respiratory function     Pre-sedation assessment completed:  12/01/2022 10:00 PM Immediate pre-procedure details:    Reassessment: Patient reassessed immediately prior to procedure     Reviewed: vital signs and relevant labs/tests     Verified: bag valve mask available, emergency equipment available, intubation equipment available, IV patency confirmed, oxygen available and suction available   Procedure details (see MAR for exact dosages):    Preoxygenation:  Nasal cannula   Sedation:  Etomidate   Intended level of sedation: deep   Analgesia:  Fentanyl   Intra-procedure monitoring:  Blood pressure monitoring, cardiac monitor, continuous pulse oximetry, continuous capnometry, frequent LOC assessments and frequent vital sign checks    Intra-procedure events: hypoxia     Intra-procedure management:  Airway repositioning (jaw thrust - sleep apnea)   Total Provider sedation time (minutes):  12 Post-procedure details:    Post-sedation assessment completed:  12/02/2022 12:16 AM   Attendance: Constant attendance by certified staff until patient recovered     Recovery: Patient returned to pre-procedure baseline     Post-sedation assessments completed and reviewed: airway patency, cardiovascular function, hydration status, mental status, nausea/vomiting, pain level and respiratory function     Patient is stable for discharge or admission: yes     Procedure completion:  Tolerated with difficulty    MEDICATIONS ORDERED IN ED: Medications  amiodarone (NEXTERONE) 1.8 mg/mL load via infusion 1 mg (1 mg Intravenous Bolus from Bag 12/01/22 2212)    Followed by  amiodarone (NEXTERONE PREMIX) 360-4.14 MG/200ML-% (1.8 mg/mL) IV  infusion (60 mg/hr Intravenous New Bag/Given 12/01/22 2213)    Followed by  amiodarone (NEXTERONE PREMIX) 360-4.14 MG/200ML-% (1.8 mg/mL) IV infusion (has no administration in time range)  amiodarone (NEXTERONE) 1.5 mg/mL IV bolus only 150 mg (0 mg Intravenous Stopped 12/01/22 2203)  etomidate (AMIDATE) injection 30 mg (30 mg Intravenous Given 12/01/22 2251)  fentaNYL (SUBLIMAZE) injection 50 mcg (50 mcg Intravenous Given 12/01/22 2252)     IMPRESSION / MDM / ASSESSMENT AND PLAN / ED COURSE  I reviewed the triage vital signs and the nursing notes.  DDx: Electrolyte abnormality, non-STEMI, anemia, hyperthyroidism, idiopathic ventricular tachycardia  Patient's presentation is most consistent with acute presentation with potential threat to life or bodily function.  Patient presents with ventricular tachycardia.  Blood pressure stable, not in distress, no severe symptoms.  Will start amnio bolus and drip and plan for admission.   Clinical Course as of 12/02/22 0016  Wynelle Link Dec 01, 2022  2236 Pt now hypotensive.  Will need ECV. D/w cardiology Dr. Carolanne Grumbling who agrees with ECV, rec continue amio, and transfer ED to ED to [facility] for urgent card eval and possible EP study [PS]  2304 Synchronized cardioversion completed, good result with conversion to sinus rhythm, improvement in blood pressure.  Monitored continuously until arousable. [PS]    Clinical Course User Index [PS] Sharman Cheek, MD    ----------------------------------------- 12:13 AM on 12/02/2022 ----------------------------------------- Patient accepted to [facility], ED by Dr. Eudelia Bunch.  [facility] en route.   FINAL CLINICAL IMPRESSION(S) / ED DIAGNOSES   Final diagnoses:  Ventricular tachycardia (HCC)     Rx / DC Orders   ED Discharge Orders     None        Note:  This document was prepared using Dragon voice recognition software and may include unintentional dictation errors.   Sharman Cheek, MD 12/02/22 [phone]

## 2022-12-02 ENCOUNTER — Encounter (HOSPITAL_COMMUNITY): Payer: Self-pay | Admitting: Emergency Medicine

## 2022-12-02 ENCOUNTER — Inpatient Hospital Stay (HOSPITAL_COMMUNITY)
Admission: EM | Admit: 2022-12-02 | Discharge: 2022-12-04 | DRG: 287 | Disposition: A | Discharge status: Home / Self Care | Payer: No Typology Code available for payment source | Source: Other Acute Inpatient Hospital | Attending: Cardiology | Admitting: Cardiology

## 2022-12-02 ENCOUNTER — Inpatient Hospital Stay (HOSPITAL_COMMUNITY): Payer: No Typology Code available for payment source

## 2022-12-02 ENCOUNTER — Encounter (HOSPITAL_COMMUNITY)
Admission: EM | Disposition: A | Discharge status: Home / Self Care | Payer: Self-pay | Source: Other Acute Inpatient Hospital | Attending: Student

## 2022-12-02 DIAGNOSIS — E785 Hyperlipidemia, unspecified: Secondary | ICD-10-CM | POA: Diagnosis present

## 2022-12-02 DIAGNOSIS — Z7982 Long term (current) use of aspirin: Secondary | ICD-10-CM

## 2022-12-02 DIAGNOSIS — K219 Gastro-esophageal reflux disease without esophagitis: Secondary | ICD-10-CM | POA: Diagnosis present

## 2022-12-02 DIAGNOSIS — F4310 Post-traumatic stress disorder, unspecified: Secondary | ICD-10-CM | POA: Diagnosis present

## 2022-12-02 DIAGNOSIS — I493 Ventricular premature depolarization: Secondary | ICD-10-CM | POA: Diagnosis present

## 2022-12-02 DIAGNOSIS — R000 Tachycardia, unspecified: Secondary | ICD-10-CM | POA: Diagnosis not present

## 2022-12-02 DIAGNOSIS — I5022 Chronic systolic (congestive) heart failure: Secondary | ICD-10-CM | POA: Diagnosis present

## 2022-12-02 DIAGNOSIS — Z6836 Body mass index (BMI) 36.0-36.9, adult: Secondary | ICD-10-CM

## 2022-12-02 DIAGNOSIS — Z87891 Personal history of nicotine dependence: Secondary | ICD-10-CM | POA: Diagnosis not present

## 2022-12-02 DIAGNOSIS — I2582 Chronic total occlusion of coronary artery: Secondary | ICD-10-CM | POA: Diagnosis present

## 2022-12-02 DIAGNOSIS — I252 Old myocardial infarction: Secondary | ICD-10-CM

## 2022-12-02 DIAGNOSIS — Z95810 Presence of automatic (implantable) cardiac defibrillator: Secondary | ICD-10-CM

## 2022-12-02 DIAGNOSIS — I255 Ischemic cardiomyopathy: Secondary | ICD-10-CM | POA: Diagnosis present

## 2022-12-02 DIAGNOSIS — Z833 Family history of diabetes mellitus: Secondary | ICD-10-CM

## 2022-12-02 DIAGNOSIS — Z955 Presence of coronary angioplasty implant and graft: Secondary | ICD-10-CM | POA: Diagnosis not present

## 2022-12-02 DIAGNOSIS — Z8249 Family history of ischemic heart disease and other diseases of the circulatory system: Secondary | ICD-10-CM

## 2022-12-02 DIAGNOSIS — I110 Hypertensive heart disease with heart failure: Secondary | ICD-10-CM | POA: Diagnosis present

## 2022-12-02 DIAGNOSIS — I5020 Unspecified systolic (congestive) heart failure: Secondary | ICD-10-CM | POA: Diagnosis not present

## 2022-12-02 DIAGNOSIS — I2510 Atherosclerotic heart disease of native coronary artery without angina pectoris: Secondary | ICD-10-CM | POA: Diagnosis present

## 2022-12-02 DIAGNOSIS — E669 Obesity, unspecified: Secondary | ICD-10-CM | POA: Diagnosis present

## 2022-12-02 DIAGNOSIS — I428 Other cardiomyopathies: Secondary | ICD-10-CM | POA: Diagnosis not present

## 2022-12-02 DIAGNOSIS — I472 Ventricular tachycardia, unspecified: Secondary | ICD-10-CM | POA: Diagnosis present

## 2022-12-02 DIAGNOSIS — Z79899 Other long term (current) drug therapy: Secondary | ICD-10-CM

## 2022-12-02 HISTORY — PX: LEFT HEART CATH AND CORONARY ANGIOGRAPHY: CATH118249

## 2022-12-02 LAB — BRAIN NATRIURETIC PEPTIDE: B Natriuretic Peptide: 219 pg/mL — ABNORMAL HIGH (ref 0.0–100.0)

## 2022-12-02 LAB — COMPREHENSIVE METABOLIC PANEL
ALT: 27 U/L (ref 0–44)
AST: 36 U/L (ref 15–41)
Albumin: 3.2 g/dL — ABNORMAL LOW (ref 3.5–5.0)
Alkaline Phosphatase: 41 U/L (ref 38–126)
Anion gap: 16 — ABNORMAL HIGH (ref 5–15)
BUN: 20 mg/dL (ref 8–23)
CO2: 23 mmol/L (ref 22–32)
Calcium: 9 mg/dL (ref 8.9–10.3)
Chloride: 97 mmol/L — ABNORMAL LOW (ref 98–111)
Creatinine, Ser: 1.3 mg/dL — ABNORMAL HIGH (ref 0.61–1.24)
GFR, Estimated: >60 mL/min (ref >60)
Glucose, Bld: 116 mg/dL — ABNORMAL HIGH (ref 70–99)
Potassium: 3.9 mmol/L (ref 3.5–5.1)
Sodium: 136 mmol/L (ref 135–145)
Total Bilirubin: 0.4 mg/dL (ref 0.3–1.2)
Total Protein: 6.8 g/dL (ref 6.5–8.1)

## 2022-12-02 LAB — BASIC METABOLIC PANEL
Anion gap: 9 (ref 5–15)
BUN: 21 mg/dL (ref 8–23)
CO2: 23 mmol/L (ref 22–32)
Calcium: 8.6 mg/dL — ABNORMAL LOW (ref 8.9–10.3)
Chloride: 101 mmol/L (ref 98–111)
Creatinine, Ser: 1.21 mg/dL (ref 0.61–1.24)
GFR, Estimated: >60 mL/min (ref >60)
Glucose, Bld: 112 mg/dL — ABNORMAL HIGH (ref 70–99)
Potassium: 3.9 mmol/L (ref 3.5–5.1)
Sodium: 133 mmol/L — ABNORMAL LOW (ref 135–145)

## 2022-12-02 LAB — RESPIRATORY PANEL BY PCR

## 2022-12-02 LAB — CBC
HCT: 39 % (ref 39.0–52.0)
Hemoglobin: 12.9 g/dL — ABNORMAL LOW (ref 13.0–17.0)
MCH: 29.5 pg (ref 26.0–34.0)
MCHC: 33.1 g/dL (ref 30.0–36.0)
MCV: 89 fL (ref 80.0–100.0)
Platelets: 204 10*3/uL (ref 150–400)
RBC: 4.38 MIL/uL (ref 4.22–5.81)
RDW: 12.5 % (ref 11.5–15.5)
WBC: 10.2 10*3/uL (ref 4.0–10.5)
nRBC: 0 % (ref 0.0–0.2)

## 2022-12-02 LAB — MAGNESIUM
Magnesium: 1.8 mg/dL (ref 1.7–2.4)
Magnesium: 2 mg/dL (ref 1.7–2.4)

## 2022-12-02 LAB — TSH: TSH: 2.333 u[IU]/mL (ref 0.350–4.500)

## 2022-12-02 LAB — TROPONIN I (HIGH SENSITIVITY)
Troponin I (High Sensitivity): 3286 ng/L (ref <18)
Troponin I (High Sensitivity): 3008 ng/L (ref <18)

## 2022-12-02 LAB — ECHOCARDIOGRAM COMPLETE
Area-P 1/2: 5.02 cm2
S' Lateral: 5.1 cm
Weight: 3952 [oz_av]

## 2022-12-02 LAB — T4, FREE: Free T4: 0.99 ng/dL (ref 0.61–1.12)

## 2022-12-02 SURGERY — LEFT HEART CATH AND CORONARY ANGIOGRAPHY
Anesthesia: LOCAL

## 2022-12-02 MED ORDER — FENTANYL CITRATE (PF) 100 MCG/2ML IJ SOLN
INTRAMUSCULAR | Status: AC
  Filled 2022-12-02: qty 2

## 2022-12-02 MED ORDER — AMIODARONE HCL IN DEXTROSE 360-4.14 MG/200ML-% IV SOLN
30.0000 mg/h | INTRAVENOUS | Status: AC
  Administered 2022-12-02 – 2022-12-03 (×3): 30 mg/h via INTRAVENOUS
  Filled 2022-12-02 (×4): qty 200

## 2022-12-02 MED ORDER — ONDANSETRON HCL 4 MG/2ML IJ SOLN: 4.0000 mg | Freq: Four times a day (QID) | INTRAMUSCULAR | Status: DC | PRN

## 2022-12-02 MED ORDER — PAROXETINE HCL 20 MG PO TABS: 40.0000 mg | ORAL_TABLET | Freq: Every day | ORAL | Status: DC | PRN

## 2022-12-02 MED ORDER — VERAPAMIL HCL 2.5 MG/ML IV SOLN
INTRAVENOUS | Status: AC
  Filled 2022-12-02: qty 2

## 2022-12-02 MED ORDER — HEPARIN SODIUM (PORCINE) 1000 UNIT/ML IJ SOLN
INTRAMUSCULAR | Status: DC | PRN
  Administered 2022-12-02: 5000 [IU] via INTRAVENOUS

## 2022-12-02 MED ORDER — MIDAZOLAM HCL 2 MG/2ML IJ SOLN
INTRAMUSCULAR | Status: DC | PRN
  Administered 2022-12-02: 2 mg via INTRAVENOUS

## 2022-12-02 MED ORDER — MIDAZOLAM HCL 2 MG/2ML IJ SOLN
INTRAMUSCULAR | Status: AC
  Filled 2022-12-02: qty 2

## 2022-12-02 MED ORDER — ARIPIPRAZOLE 5 MG PO TABS: 10.0000 mg | ORAL_TABLET | Freq: Every day | ORAL | Status: DC | PRN

## 2022-12-02 MED ORDER — PERFLUTREN LIPID MICROSPHERE
1.0000 mL | INTRAVENOUS | Status: AC | PRN
  Administered 2022-12-02: 2 mL via INTRAVENOUS

## 2022-12-02 MED ORDER — SPIRONOLACTONE 12.5 MG HALF TABLET: 25.0000 mg | ORAL_TABLET | Freq: Every day | ORAL | Status: DC

## 2022-12-02 MED ORDER — VERAPAMIL HCL 2.5 MG/ML IV SOLN
INTRAVENOUS | Status: DC | PRN
  Administered 2022-12-02: 10 mL via INTRA_ARTERIAL

## 2022-12-02 MED ORDER — ACETAMINOPHEN 325 MG PO TABS
650.0000 mg | ORAL_TABLET | ORAL | Status: DC | PRN
  Administered 2022-12-02 – 2022-12-03 (×2): 650 mg via ORAL
  Filled 2022-12-02: qty 2

## 2022-12-02 MED ORDER — LORATADINE 10 MG PO TABS: 10.0000 mg | ORAL_TABLET | Freq: Every day | ORAL | Status: DC | PRN

## 2022-12-02 MED ORDER — NAPHAZOLINE-PHENIRAMINE 0.025-0.3 % OP SOLN: 1.0000 [drp] | Freq: Every day | OPHTHALMIC | Status: DC | PRN

## 2022-12-02 MED ORDER — CARVEDILOL 25 MG PO TABS
25.0000 mg | ORAL_TABLET | Freq: Two times a day (BID) | ORAL | Status: DC
  Administered 2022-12-02 – 2022-12-04 (×4): 25 mg via ORAL
  Filled 2022-12-02: qty 1
  Filled 2022-12-02: qty 2
  Filled 2022-12-02 (×3): qty 1

## 2022-12-02 MED ORDER — HEPARIN SODIUM (PORCINE) 1000 UNIT/ML IJ SOLN
INTRAMUSCULAR | Status: AC
  Filled 2022-12-02: qty 10

## 2022-12-02 MED ORDER — SODIUM CHLORIDE 0.9% FLUSH
3.0000 mL | Freq: Two times a day (BID) | INTRAVENOUS | Status: DC
  Administered 2022-12-02 – 2022-12-04 (×4): 3 mL via INTRAVENOUS

## 2022-12-02 MED ORDER — AMIODARONE HCL IN DEXTROSE 360-4.14 MG/200ML-% IV SOLN
60.0000 mg/h | INTRAVENOUS | Status: AC
  Administered 2022-12-02: 60 mg/h via INTRAVENOUS

## 2022-12-02 MED ORDER — ACETAMINOPHEN 325 MG PO TABS
650.0000 mg | ORAL_TABLET | ORAL | Status: DC | PRN
  Administered 2022-12-02: 650 mg via ORAL
  Filled 2022-12-02: qty 2

## 2022-12-02 MED ORDER — HYDRALAZINE HCL 20 MG/ML IJ SOLN: 10.0000 mg | INTRAMUSCULAR | Status: AC | PRN

## 2022-12-02 MED ORDER — ASPIRIN 325 MG PO TBEC
325.0000 mg | DELAYED_RELEASE_TABLET | Freq: Every day | ORAL | Status: DC
  Administered 2022-12-02 – 2022-12-04 (×3): 325 mg via ORAL
  Filled 2022-12-02 (×3): qty 1

## 2022-12-02 MED ORDER — PANTOPRAZOLE SODIUM 40 MG PO TBEC
40.0000 mg | DELAYED_RELEASE_TABLET | Freq: Every day | ORAL | Status: DC
  Administered 2022-12-02 – 2022-12-04 (×3): 40 mg via ORAL
  Filled 2022-12-02 (×3): qty 1

## 2022-12-02 MED ORDER — NITROGLYCERIN 0.4 MG SL SUBL: 0.4000 mg | SUBLINGUAL_TABLET | SUBLINGUAL | Status: DC | PRN

## 2022-12-02 MED ORDER — IOHEXOL 350 MG/ML SOLN
INTRAVENOUS | Status: DC | PRN
  Administered 2022-12-02: 24 mL

## 2022-12-02 MED ORDER — AMIODARONE HCL IN DEXTROSE 360-4.14 MG/200ML-% IV SOLN: 30.0000 mg/h | INTRAVENOUS | Status: DC

## 2022-12-02 MED ORDER — SODIUM CHLORIDE 0.9 % IV SOLN: INTRAVENOUS | Status: DC

## 2022-12-02 MED ORDER — SODIUM CHLORIDE 0.9 % IV SOLN: 250.0000 mL | INTRAVENOUS | Status: DC | PRN

## 2022-12-02 MED ORDER — LIDOCAINE HCL (PF) 1 % IJ SOLN
INTRAMUSCULAR | Status: DC | PRN
  Administered 2022-12-02: 2 mL

## 2022-12-02 MED ORDER — FENTANYL CITRATE (PF) 100 MCG/2ML IJ SOLN
INTRAMUSCULAR | Status: DC | PRN
  Administered 2022-12-02: 25 ug via INTRAVENOUS

## 2022-12-02 MED ORDER — HEPARIN (PORCINE) IN NACL 1000-0.9 UT/500ML-% IV SOLN
INTRAVENOUS | Status: DC | PRN
  Administered 2022-12-02 (×2): 500 mL

## 2022-12-02 MED ORDER — ACETAMINOPHEN 325 MG PO TABS
ORAL_TABLET | ORAL | Status: AC
  Filled 2022-12-02: qty 2

## 2022-12-02 MED ORDER — LABETALOL HCL 5 MG/ML IV SOLN: 10.0000 mg | INTRAVENOUS | Status: AC | PRN

## 2022-12-02 MED ORDER — SACUBITRIL-VALSARTAN 97-103 MG PO TABS: 1.0000 | ORAL_TABLET | Freq: Two times a day (BID) | ORAL | Status: DC

## 2022-12-02 MED ORDER — ATORVASTATIN CALCIUM 40 MG PO TABS
40.0000 mg | ORAL_TABLET | Freq: Every day | ORAL | Status: DC
  Administered 2022-12-02 – 2022-12-04 (×3): 40 mg via ORAL
  Filled 2022-12-02 (×3): qty 1

## 2022-12-02 MED ORDER — ENOXAPARIN SODIUM 40 MG/0.4ML IJ SOSY
40.0000 mg | PREFILLED_SYRINGE | INTRAMUSCULAR | Status: DC
  Administered 2022-12-02 – 2022-12-04 (×3): 40 mg via SUBCUTANEOUS
  Filled 2022-12-02 (×3): qty 0.4

## 2022-12-02 MED ORDER — SODIUM CHLORIDE 0.9% FLUSH: 3.0000 mL | INTRAVENOUS | Status: DC | PRN

## 2022-12-02 MED ORDER — FLUTICASONE PROPIONATE 50 MCG/ACT NA SUSP: 2.0000 | Freq: Every day | NASAL | Status: DC | PRN

## 2022-12-02 MED ORDER — LIDOCAINE HCL (PF) 1 % IJ SOLN
INTRAMUSCULAR | Status: AC
  Filled 2022-12-02: qty 30

## 2022-12-02 SURGICAL SUPPLY — 8 items
CATH 5FR JL3.5 JR4 ANG PIG MP (CATHETERS) ×1 IMPLANT
DEVICE RAD COMP TR BAND LRG (VASCULAR PRODUCTS) ×1 IMPLANT
GLIDESHEATH SLEND SS 6F .021 (SHEATH) ×1 IMPLANT
GUIDEWIRE INQWIRE 1.5J.035X260 (WIRE) IMPLANT
INQWIRE 1.5J .035X260CM (WIRE) ×1
PACK CARDIAC CATHETERIZATION (CUSTOM PROCEDURE TRAY) ×2 IMPLANT
SET ATX-X65L (MISCELLANEOUS) ×1 IMPLANT
SHEATH PROBE COVER 6X72 (BAG) ×1 IMPLANT

## 2022-12-02 NOTE — Interval H&P Note (Signed)
History and Physical Interval Note:  12/02/2022 4:07 PM  [name] [name]  has presented today for surgery, with the diagnosis of vt.  The various methods of treatment have been discussed with the patient and family. After consideration of risks, benefits and other options for treatment, the patient has consented to  Procedure(s): LEFT HEART CATH AND CORONARY ANGIOGRAPHY (N/A) as a surgical intervention.  The patient's history has been reviewed, patient examined, no change in status, stable for surgery.  I have reviewed the patient's chart and labs.  Questions were answered to the patient's satisfaction.     [name]

## 2022-12-02 NOTE — Progress Notes (Signed)
   12/02/22 2334  BiPAP/CPAP/SIPAP  $ Non-Invasive Ventilator  Non-Invasive Vent Set Up  $ Non-Invasive Home Ventilator  Initial  $ Face Mask Large  Yes  BiPAP/CPAP/SIPAP Pt Type Adult  BiPAP/CPAP/SIPAP Resmed  Mask Type Full face mask  Mask Size Large  IPAP 9.6 cmH20  EPAP 5.6 cmH2O  Pressure Support 4 cmH20  PEEP 5.6 cmH20  Flow Rate 1 lpm  Patient Home Equipment No  Auto Titrate Yes  Nasal massage performed Yes  CPAP/SIPAP surface wiped down Yes  Safety Check Completed by RT for Home Unit Yes, no issues noted

## 2022-12-02 NOTE — ED Notes (Signed)
Per patient ICD name Carolyn Stare VR Model [id number]

## 2022-12-02 NOTE — H&P (Addendum)
Cardiology Admission History and Physical   Patient ID: DARTANION TEO MRN: [id number]; DOB: 1960-08-27   Admission date: 12/02/2022  PCP:  [facility] Providers Cardiologist:  Tonny Bollman, MD  Cardiology APP:  Beatrice Lecher, PA-C  Electrophysiologist:  Will Jorja Loa, MD       Chief Complaint:  wide complex tachycardia  Patient Profile:   [name] [name] is a 62 y.o. male with h/o CAD, chronic systolic CHF, h/o VT, HTN who is being evaluated for wide complex tachycardia s/p cardioversion to NSR.  History of Present Illness:   [name] [name] is a 62 y.o. male with h/o CAD, chronic systolic CHF, h/o VT, HTN who is being evaluated for wide complex tachycardia s/p cardioversion.  Pt presented to [facility] for complaints of "heart racing" that started at [facility] on 9/29 while at rest. He denied exertional symptoms, chest pain, SOB, fever, recent illness or appetite changes. He started to become hypotensive with SBP 70-80s in the ER, so Dr. Carolanne Grumbling was contacted who recommended synchronized cardioversion, amiodarone drip and transfer to [facility]. Per Dr. Mayford Knife, rhythm was wide complex tachycardia with similar axis to baseline rhythm, suggestive of 1:1 atrial flutter with aberrancy versus ventricular tachycardia. Patient did not receive shock from ICD. Patient underwent synchronized cardioversion back to NSR. He was placed on an IV amiodarone drip and transferred to [facility] [place].   In the [facility] ER, he remains in NSR with PVCs. He is hemodynamically stable (BP 108/71, HR 84) and denies chest pain, orthopnea, LE edema, PND, SOB. On my examination, patient remains chest pain free and asymptomatic.  With regards to cardiac history, he was seen by Dr. Elberta Fortis 03/2022 after ICD shock while chopping wood. LHC 04/01/22 showed single vessel CAD with total occlusion of mid to distal LAD within previously implanted  stents and Left to left collaterals supplying apical LAD. He was treated medically. His last ICD interrogation was 10/29/22 which showed 1 NSVT episode, no therapy.  Device History: St. Jude Single Chamber ICD implanted 08/07/2010 for Chronic systolic CHF History of appropriate therapy: Yes  Past Medical History:  Diagnosis Date   CAD (coronary artery disease)    anterior MI 2008, treated with bare metal stent to LAD   Chronic systolic heart failure (HCC) 06/27/2010   Ischemic CM s/p ICD // Large anterior MI in 2008 tx with BMS to LAD // cMRI 6/10:  Ant, inf, septal, apical AK with full thickness scar, EF 23% // Echo 4/12: Inferoapical, apical, septal and dist ant AK, inferobasal HK, mild LVH, EF 25-30%, mild MR, mild LAE    GERD (gastroesophageal reflux disease)    HLD (hyperlipidemia)    HTN (hypertension)    Ischemic cardiomyopathy    NYHA Class II/III CHF   MI (myocardial infarction) (HCC)    Pericarditis; 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   Umbilical hernia    Ventricular tachycardia (HCC) 07/29/2018   received appropriate ATP therapy for VT at 218 bp with syncope    Past Surgical History:  Procedure Laterality Date   CARDIAC DEFIBRILLATOR PLACEMENT  08/07/2010   SJM Fortify VR ST implanted by Dr Johney Frame, part of the Analyze ST ICD study   DEFIBRILLATOR FUNCTION TEST     FINGER SURGERY  04/05/2007   laceration repair   ICD GENERATOR CHANGEOUT N/A 04/27/2021   Procedure: ICD GENERATOR CHANGEOUT;  Surgeon: Hillis Range, MD;  Location: [facility] INVASIVE  CV LAB;  Service: Cardiovascular;  Laterality: N/A;   LEFT HEART CATH AND CORONARY ANGIOGRAPHY N/A 04/01/2022   Procedure: LEFT HEART CATH AND CORONARY ANGIOGRAPHY;  Surgeon: Tonny Bollman, MD;  Location: [facility] INVASIVE CV LAB;  Service: Cardiovascular;  Laterality: N/A;     Medications Prior to Admission: Prior to Admission medications   Medication Sig Start Date End Date Taking? Authorizing Provider  acetaminophen (TYLENOL) 500 MG tablet Take  1,000 mg by mouth every 6 (six) hours as needed for moderate pain.    [provider]  ARIPiprazole (ABILIFY) 10 MG tablet Take 10 mg by mouth daily as needed (for ptsd).    [provider]  aspirin EC 325 MG tablet Take 1 tablet by mouth daily. 08/09/22   [provider]  aspirin-sod bicarb-citric acid (ALKA-SELTZER) 325 MG TBEF tablet Take 650 mg by mouth every 6 (six) hours as needed (indigestion).    [provider]  carvedilol (COREG) 25 MG tablet Take 1 tablet (25 mg total) by mouth 2 (two) times daily with a meal. 07/26/14   Tonny Bollman, MD  cholecalciferol (VITAMIN D) 1000 UNITS tablet Take 1,000 Units by mouth daily.    [provider]  diclofenac Sodium (VOLTAREN) 1 % GEL Apply 2 g topically 4 (four) times daily as needed (pain). 02/13/22   [provider]  fish oil-omega-3 fatty acids 1000 MG capsule Take 1 g by mouth daily.     [provider]  fluticasone (FLONASE) 50 MCG/ACT nasal spray Place 2 sprays into both nostrils as needed for allergies or rhinitis.    [provider]  lidocaine (LIDODERM) 5 % Place 1 patch onto the skin daily as needed (pain). Remove & Discard patch within 12 hours or as directed by MD    [provider]  loratadine (CLARITIN) 10 MG tablet Take 10 mg by mouth daily as needed for allergies.    [provider]  Multiple Vitamin (MULTIVITAMIN) tablet Take 1 tablet by mouth daily.      [provider]  nitroGLYCERIN (NITROSTAT) 0.4 MG SL tablet Place 1 tablet (0.4 mg total) under the tongue every 5 (five) minutes x 3 doses as needed for chest pain. 08/15/21   Tereso Newcomer T, PA-C  omeprazole (PRILOSEC) 20 MG capsule Take 20 mg by mouth as needed (gerd/reflux).    [provider]  PARoxetine (PAXIL) 40 MG tablet Take 40 mg by mouth daily as needed (for PTSD).    [provider]  sacubitril-valsartan (ENTRESTO) 97-103 MG Take 1 tablet by mouth 2 (two)  times daily. 12/17/21   Tonny Bollman, MD  simvastatin (ZOCOR) 80 MG tablet Take 40 mg by mouth at bedtime. 12/20/11   Tonny Bollman, MD  spironolactone (ALDACTONE) 25 MG tablet Take 1 tablet (25 mg total) by mouth daily. 01/12/21   Allred, Fayrene Fearing, MD  terbinafine (LAMISIL) 250 MG tablet Take 1 tablet (250 mg total) by mouth daily. 03/14/22   Candelaria Stagers, DPM  Tetrahydrozoline HCl (VISINE OP) Place 1 drop into both eyes daily as needed (irritation).    [provider]  vitamin C (ASCORBIC ACID) 500 MG tablet Take 500 mg by mouth daily.    [provider]     Allergies:   No Known Allergies  Social History:   Social History   Socioeconomic History   Marital status: Married    Spouse name: Not on file   Number of children: Not on file   Years of education: Not  on file   Highest education level: Not on file  Occupational History   Occupation: truck driver  Tobacco Use   Smoking status: Former    Types: Cigars    Quit date: 03/04/2006    Years since quitting: 16.7   Smokeless tobacco: Never  Vaping Use   Vaping status: Never Used  Substance and Sexual Activity   Alcohol use: Yes    Comment: occasionally   Drug use: No   Sexual activity: Not on file  Other Topics Concern   Not on file  Social History Narrative   Lives in [place] with spouse.  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   Social Determinants of Health   Financial Resource Strain: Low Risk  (10/12/2021)   Overall Financial Resource Strain (CARDIA)    Difficulty of Paying Living Expenses: Not very hard  Food Insecurity: No Food Insecurity (10/12/2021)   Hunger Vital Sign    Worried About Running Out of Food in the Last Year: Never true    Ran Out of Food in the Last Year: Never true  Transportation Needs: No Transportation Needs (10/12/2021)   PRAPARE - Administrator, Civil Service (Medical): No    Lack of Transportation  (Non-Medical): No  Physical Activity: Not on file  Stress: Not on file  Social Connections: Not on file  Intimate Partner Violence: Not on file    Family History:   The patient's family history includes Diabetes in his mother and unknown relative; Heart Problems in his father; Heart failure in his unknown relative; Hypertension in his mother.    ROS:  Please see the history of present illness.   Physical Exam/Data:   Vitals:   12/02/22 0145 12/02/22 0200 12/02/22 0215 12/02/22 0230  BP: 102/68 100/65 112/65 95/68  Pulse: 64 68 74 67  Resp: 17 16 19 17   Temp:      TempSrc:      SpO2: 95% 96% 96% 96%  Weight:       No intake or output data in the 24 hours ending 12/02/22 0246    12/02/2022    1:03 AM 08/26/2022    8:01 AM 08/12/2022    7:42 AM  Last 3 Weights  Weight (lbs) 247 lb 246 lb 9.6 oz 248 lb 6.4 oz  Weight (kg) 112.038 kg 111.857 kg 112.674 kg     Body mass index is 36.48 kg/m.  General:  Well nourished, well developed, in no acute distress HEENT: normal Neck: no JVD Vascular: No carotid bruits; Distal pulses 2+ bilaterally   Cardiac:  normal S1, S2; RRR; no murmur  Lungs:  clear to auscultation bilaterally, no wheezing, rhonchi or rales  Abd: soft, nontender, no hepatomegaly  Ext: no edema Musculoskeletal:  No deformities, BUE and BLE strength normal and equal Skin: warm and dry  Neuro:  CNs 2-12 intact, no focal abnormalities noted Psych:  Normal affect   EKG:  The ECG that was personally reviewed and demonstrates NSR with PACs  Relevant CV Studies: [facility] 04/01/2022 1.  Single-vessel coronary artery disease with total occlusion of the mid to distal LAD within the previously implanted stents, left to left collaterals present supplying the apical LAD 2.  Widely patent left main, left circumflex, ramus intermedius, and RCA, with minimal irregularity but no significant stenoses 3.  Normal LVEDP, dense calcification of the LV apex noted, consistent with old  infarction  ICD interrogation 10/30/22 Scheduled remote reviewed. Normal  device function.   1 NSVT, no therapy  Next remote 91 days.  LA, CVRS   Cardiac MRI    1)    Moderate to severe LV enlargement with mid and distal        anterior and inferior, septal and apical akinesis.  Findings would        be consistant with wrap around LAD infarct  2)    Full thickness scar involving large area of myocardium  described by [facility] above  3)    Quantitative EF 23%  4)    Normal right sided cardiac chambers  5)    No pericardial effusion.   Laboratory Data:  High Sensitivity Troponin:   Recent Labs  Lab 12/01/22 2130  TROPONINIHS 14      Chemistry Recent Labs  Lab 12/02/22 0134  NA 136  K 3.9  CL 97*  CO2 23  GLUCOSE 116*  BUN 20  CREATININE 1.30*  CALCIUM 9.0  MG 1.8  GFRNONAA >60  ANIONGAP 16*    Recent Labs  Lab 12/02/22 0134  PROT 6.8  ALBUMIN 3.2*  AST 36  ALT 27  ALKPHOS 41  BILITOT 0.4   Lipids No results for input(s): "CHOL", "TRIG", "HDL", "LABVLDL", "LDLCALC", "CHOLHDL" in the last 168 hours. Hematology Recent Labs  Lab 12/01/22 2130  WBC 12.7*  RBC 5.09  HGB 14.7  HCT 45.9  MCV 90.2  MCH 28.9  MCHC 32.0  RDW 12.7  PLT 163   Thyroid No results for input(s): "TSH", "FREET4" in the last 168 hours. BNP Recent Labs  Lab 12/02/22 0134  BNP 219.0*    DDimer No results for input(s): "DDIMER" in the last 168 hours.   Radiology/Studies:  DG Chest Port 1 View  Result Date: 12/01/2022 CLINICAL DATA:  Chest pain.  Elevated heart rate. EXAM: PORTABLE CHEST 1 VIEW COMPARISON:  08/08/2010 FINDINGS: Cardiac pacemaker. Mild cardiac enlargement. No vascular congestion or edema. There is a focal nodular area of infiltration in the right upper lung laterally measuring 3.2 cm diameter. This is possible to represent a mass or focal consolidation. CT suggested for further evaluation. No pleural effusions. No pneumothorax. Mediastinal contours appear intact.  IMPRESSION: 1. Cardiac enlargement. 2. Masslike opacity measuring 3.2 cm in the right upper lung, possibly focal consolidation or mass lesion. CT suggested for further evaluation. Electronically Signed   By: Burman Nieves M.D.   On: 12/01/2022 22:21     Assessment and Plan:  NIVAN MELENDREZ is a 62 y.o. male with h/o CAD, chronic systolic CHF, h/o VT, HTN who is being evaluated for wide complex tachycardia s/p cardioversion to NSR.  #. Wide complex tachycardia: Atrial flutter with aberrancy vs. Ventricular tachycardia s/p CV to NSR #. History of VT (not on AAD) Presented with wide complex tachycardia at [facility] c/w atrial flutter w/ aberrancy vs VT. Did not received ICD therapy. Started to become hypotensive and thus received synchronized cardioversion to NSR. Placed on IV amiodarone and transferred to [facility].  Now in NSR with PVCs on IV amiodarone drip. No cardiac symptoms.  Unclear trigger for arrhythmia. Troponin 14 and patient denied chest pain, no recent sickness/fever. Electrolytes pending. Has history of VT on 10/2020 treated with ATP and ICD shock 03/2022 while cutting wood. Plan: - Device interrogation (St. Jude) ordered - Continue IV amiodarone for atrial vs ventricular tachycardia, transition to oral medication in AM per EP - restart home coreg 25 mg BID - Keep K>4, MG>2 - ECHO  ordered - viral panel pending - TSH, T4 - Hold entresto and spiro in the setting of relative hypotension  #. HFrEF EF 20%, ICM Most recent 2D echo completed 10/2020 showing chronically depressed LV function of 25-30% with grade 1 DD, severe akinesis with no valvular abnormalities. Euvolemic on exam today. On coreg 25 mg Bid, entresto 97/103, spiro 25 at home - continue coreg BID - holding entresto and spiro in the setting of relative hypotension  #. CAD #. HLD Anterior STEMI 2008 s/p BMS to LAD. Most recent LHC with occluded mid to distal LAD within prior stent with left to  left collaterals from Lcx. Treated medically - simvastatin 80 mg - aspirin 325  #. HTN - holding meds as above  #. PTSD - aripiprazole prn  Risk Assessment/Risk Scores:          Code Status: Full Code  Severity of Illness: The appropriate patient status for this patient is INPATIENT. Inpatient status is judged to be reasonable and necessary in order to provide the required intensity of service to ensure the patient's safety. The patient's presenting symptoms, physical exam findings, and initial radiographic and laboratory data in the context of their chronic comorbidities is felt to place them at high risk for further clinical deterioration. Furthermore, it is not anticipated that the patient will be medically stable for discharge from the hospital within 2 midnights of admission.   * I certify that at the point of admission it is my clinical judgment that the patient will require inpatient hospital care spanning beyond 2 midnights from the point of admission due to high intensity of service, high risk for further deterioration and high frequency of surveillance required.*   For questions or updates, please contact [facility] Please consult [email or web address] for contact info under     Signed, Willette Alma MD MPH [facility] Cardiology 12/02/2022 2:46 AM

## 2022-12-02 NOTE — Progress Notes (Signed)
Patient states back and head feel much better.

## 2022-12-02 NOTE — ED Triage Notes (Signed)
Patient BIB [facility] from [facility] c/o his AICD not firing and his heart rate being 200.  [facility] gave amiodarone bolus and cardioverted him.  Patient has been at NSR since. Patient denies chest pain currently.

## 2022-12-02 NOTE — ED Notes (Signed)
MAIN PHARMACY CONTACTED REGARDING PTS AMIO.

## 2022-12-02 NOTE — Consult Note (Addendum)
ELECTROPHYSIOLOGY CONSULT NOTE    Patient ID: [name] [name] MRN: [id number], DOB/AGE: 1960/09/15 62 y.o.  Admit date: 12/02/2022 Date of Consult: 12/02/2022  Primary Physician: [facility] Primary Cardiologist: Tonny Bollman, MD  Electrophysiologist: Dr. Elberta Fortis   Referring Provider: Dr. Evette Doffing  Patient Profile: [name] [name] is a 62 y.o. male with a history of CAD, chronic sysotlic CHF, h/o VT and HTN who is being seen today for the evaluation of VT below detection at the request of Dr. Evette Doffing.  HPI:   With regards to cardiac history, he was seen by Dr. Elberta Fortis 03/2022 after ICD shock while chopping wood. LHC 04/01/22 showed single vessel CAD with total occlusion of mid to distal LAD within previously implanted stents and Left to left collaterals supplying apical LAD. He was treated medically. His last ICD interrogation was 10/29/22 which showed 1 NSVT episode, no therapy.    Pt presented to [facility] for complaints of "heart racing" that started at [facility] on 9/29 while at rest. He denied exertional symptoms, chest pain, SOB, fever, recent illness or appetite changes. He started to become hypotensive with SBP 70-80s in the ER, so Dr. Carolanne Grumbling was contacted who recommended synchronized cardioversion, amiodarone drip and transfer to [facility]. Per Dr. Mayford Knife, rhythm was wide complex tachycardia with similar axis to baseline rhythm, suggestive of 1:1 atrial flutter with aberrancy versus ventricular tachycardia. Patient did not receive shock from ICD. Patient underwent synchronized cardioversion back to NSR. He was placed on an IV amiodarone drip and transferred to [facility] [place].   Pt feeling OK this am. Denies SOB, CP, fever, chills, sick contacts or medication non compliance.  He repeats that he was at rest when his tachy-palpitations started. No exertional symptoms.  Verbalizes understanding that driving restrictions will restart with additional VT  and defibrillation.   Labs Potassium3.9 (09/30 0347) Magnesium  2.0 (09/30 0347) Creatinine, ser  1.21 (09/30 0347) PLT  204 (09/30 0347) HGB  12.9* (09/30 0347) WBC 10.2 (09/30 0347) Troponin I (High Sensitivity)14 (09/29 2130).    Past Medical History:  Diagnosis Date   CAD (coronary artery disease)    anterior MI 2008, treated with bare metal stent to LAD   Chronic systolic heart failure (HCC) 06/27/2010   Ischemic CM s/p ICD // Large anterior MI in 2008 tx with BMS to LAD // cMRI 6/10:  Ant, inf, septal, apical AK with full thickness scar, EF 23% // Echo 4/12: Inferoapical, apical, septal and dist ant AK, inferobasal HK, mild LVH, EF 25-30%, mild MR, mild LAE    GERD (gastroesophageal reflux disease)    HLD (hyperlipidemia)    HTN (hypertension)    Ischemic cardiomyopathy    NYHA Class II/III CHF   MI (myocardial infarction) (HCC)    Pericarditis; 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   Umbilical hernia    Ventricular tachycardia (HCC) 07/29/2018   received appropriate ATP therapy for VT at 218 bp with syncope     Surgical History:  Past Surgical History:  Procedure Laterality Date   CARDIAC DEFIBRILLATOR PLACEMENT  08/07/2010   SJM Fortify VR ST implanted by Dr Johney Frame, part of the Analyze ST ICD study   DEFIBRILLATOR FUNCTION TEST     FINGER SURGERY  04/05/2007   laceration repair   ICD GENERATOR CHANGEOUT N/A 04/27/2021   Procedure: ICD GENERATOR CHANGEOUT;  Surgeon: Hillis Range, MD;  Location: [facility] INVASIVE CV LAB;  Service: Cardiovascular;  Laterality: N/A;   LEFT  HEART CATH AND CORONARY ANGIOGRAPHY N/A 04/01/2022   Procedure: LEFT HEART CATH AND CORONARY ANGIOGRAPHY;  Surgeon: Tonny Bollman, MD;  Location: [facility] INVASIVE CV LAB;  Service: Cardiovascular;  Laterality: N/A;     (Not in a hospital admission)   Inpatient Medications:   aspirin EC  325 mg Oral Daily   atorvastatin  40 mg Oral Daily   carvedilol  25 mg Oral BID WC   enoxaparin (LOVENOX) injection  40 mg  Subcutaneous Q24H   pantoprazole  40 mg Oral Daily    Allergies: No Known Allergies  Family History  Problem Relation Age of Onset   Hypertension Mother    Diabetes Mother    Heart Problems Father    Heart failure Unknown    Diabetes Unknown      Physical Exam: Vitals:   12/02/22 0515 12/02/22 0545 12/02/22 0600 12/02/22 0604  BP: 101/63 97/68 106/63 106/63  Pulse: 62 63 (!) 58 (!) 57  Resp: 16 14 15 15   Temp:    97.9 F (36.6 C)  TempSrc:    Oral  SpO2: (!) 82% 92% 96% 93%  Weight:        GEN- NAD, A&O x 3, normal affect HEENT: Normocephalic, atraumatic Lungs- CTAB, Normal effort.  Heart- Regular rate and rhythm, No M/G/R.  GI- Soft, NT, ND.  Extremities- No clubbing, cyanosis, or edema   Radiology/Studies: DG Chest Port 1 View  Result Date: 12/01/2022 CLINICAL DATA:  Chest pain.  Elevated heart rate. EXAM: PORTABLE CHEST 1 VIEW COMPARISON:  08/08/2010 FINDINGS: Cardiac pacemaker. Mild cardiac enlargement. No vascular congestion or edema. There is a focal nodular area of infiltration in the right upper lung laterally measuring 3.2 cm diameter. This is possible to represent a mass or focal consolidation. CT suggested for further evaluation. No pleural effusions. No pneumothorax. Mediastinal contours appear intact. IMPRESSION: 1. Cardiac enlargement. 2. Masslike opacity measuring 3.2 cm in the right upper lung, possibly focal consolidation or mass lesion. CT suggested for further evaluation. Electronically Signed   By: Burman Nieves M.D.   On: 12/01/2022 22:21    EKG: on arrival shows WCT in 190s, transition between V5 and V6, positive in inferior leads + aVR - aVL, - lead 1 (personally reviewed)  ? Of 1:1 AFL was brought up, though appears to have VA dissociation, so may represent retrograde conduction.   TELEMETRY: NSR 60s occasional PVCs (personally reviewed)  Device History: St. Jude Single Chamber ICD implanted 08/07/2010 for Chronic systolic CHF History of  appropriate therapy: Yes History of AAD therapy: No     Assessment/Plan:  VT below detection Device previously programmed for shock/atp > 214 bpm only. Monitor zone started at 179 bpm Initial HS trop negative -> If follow up positive consider cath.  If follow up negative continue IV amiodarone today, consider po tomorrow.  Will adjust detection prior to d/c He verbalizes understanding that driving restrictions restart for another 6 months.   Chronic systolic dysfunction s/p Abbott single chamber ICD  Volume status appears OK.  Update Echo Continue GDMT as tolerated.  Full interrogation to adjust VT windows prior to d/c.  Continue coreg 25 mg BID, continue Entresto 97/103, Continue spiro 25   CAD Denies s/s ischemia LHC 03/2022 with CTO of LAD within prior stents with collaterals, otherwise widely patent vessels.  HS trop 14 -> pending.     HTN Follow and resume GDMT as tolerated  Further recommendations pending MD discussion and repeat troponin.   ADDENDUM Discussed with  MD and interventional cardiology. HS troponin resulted at >3000. Worth a re-look cath, though likely scar mediated VT. Will plan LHC later this morning/afternoon. Continue amiodarone.   For questions or updates, please contact [facility] Please consult [email or web address] for contact info under Cardiology/STEMI.  Dustin Flock, PA-C  12/02/2022 7:06 AM

## 2022-12-02 NOTE — ED Provider Notes (Signed)
[place] EMERGENCY DEPARTMENT AT [facility] Provider Note  CSN: [id number] Arrival date & time: 12/02/22 0101  Chief Complaint(s) Chest Pain  HPI [name] [name] is a 62 y.o. male with a past medical history listed below including CAD status post MI with a stent to the LAD, chronic systolic heart failure with a last EF of 25% quiring ICD placement here as a transfer from [facility] after he presented for chest pain and was found to be in V. tach with rates in the 190s.  He was hemodynamically stable at that time.  Patient was started on amiodarone and required cardioversion.  Converted to normal sinus rhythm.  Case was discussed with cardiology who recommended ED to ED transfer for EP admission.  Currently reports being chest pain-free and feeling much better.  Chest Pain   Past Medical History Past Medical History:  Diagnosis Date   CAD (coronary artery disease)    anterior MI 2008, treated with bare metal stent to LAD   Chronic systolic heart failure (HCC) 06/27/2010   Ischemic CM s/p ICD // Large anterior MI in 2008 tx with BMS to LAD // cMRI 6/10:  Ant, inf, septal, apical AK with full thickness scar, EF 23% // Echo 4/12: Inferoapical, apical, septal and dist ant AK, inferobasal HK, mild LVH, EF 25-30%, mild MR, mild LAE    GERD (gastroesophageal reflux disease)    HLD (hyperlipidemia)    HTN (hypertension)    Ischemic cardiomyopathy    NYHA Class II/III CHF   MI (myocardial infarction) (HCC)    Pericarditis; 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   Umbilical hernia    Ventricular tachycardia (HCC) 07/29/2018   received appropriate ATP therapy for VT at 218 bp with syncope   Patient Active Problem List   Diagnosis Date Noted   Wide-complex tachycardia 12/02/2022   Cardiomyopathy (HCC) 03/13/2022   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   Chronic systolic (congestive) heart failure (HCC) 03/13/2022   Disorder of refraction 03/13/2022   Dry eyes 03/13/2022    Elevated blood-pressure reading without diagnosis of hypertension 03/13/2022   Major depressive disorder, single episode, unspecified 03/13/2022   Nasal congestion 03/13/2022   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   Pain in right toe(s) 03/13/2022   Presbyopia 03/13/2022   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   Paroxysmal ventricular tachycardia (HCC) 03/21/2021   Preoperative cardiovascular examination 03/21/2021   Meibomian gland dysfunction of right eye 01/12/2021   Major depressive disorder, recurrent, 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   Obstructive sleep apnea 01/12/2021   Implantable cardioverter-defibrillator (ICD) in situ 11/15/2011   HFrEF (heart failure with reduced ejection fraction) (HCC) 06/27/2010   Ischemic cardiomyopathy 09/07/2008   CAD (coronary artery disease) 07/24/2008   Hyperlipidemia LDL goal <70 07/19/2008   Essential hypertension 04/07/2006   GERD 04/07/2006   Home Medication(s) Prior to Admission medications   Medication Sig Start Date End Date Taking? Authorizing Provider  acetaminophen (TYLENOL) 500 MG tablet Take 1,000 mg by mouth every 8 (eight) hours as needed for moderate pain.   Yes [provider]  ARIPiprazole (ABILIFY) 10 MG tablet Take 10 mg by mouth daily as needed (for ptsd).   Yes [provider]  aspirin EC 325 MG tablet Take 1 tablet by mouth daily. 08/09/22  Yes [provider]  aspirin-sod bicarb-citric acid (ALKA-SELTZER) 325 MG TBEF tablet Take 650 mg by mouth every 6 (six) hours as needed (indigestion).   Yes [provider]  carvedilol (COREG) 25 MG tablet Take 1 tablet (  25 mg total) by mouth 2 (two) times daily with a meal. 07/26/14  Yes Tonny Bollman, MD  cholecalciferol (VITAMIN D) 1000 UNITS tablet Take 1,000 Units by mouth daily.   Yes [provider]  ciclopirox (PENLAC) 8 % solution Apply 1 application  topically daily. APPLY DAILY TO FUNGAL TOE NAILS REMOVE WITH NAIL POLISH REMOVER ONCE WEEKLY APPLY DAILY TO  FUNGAL TOE NAILS REMOVE WITH NAIL POLISH REMOVER ONCE WEEKLY FUNGAL TOE NAILS 10/17/22  Yes [provider]  diclofenac Sodium (VOLTAREN) 1 % GEL Apply 2 g topically 4 (four) times daily as needed (shoulder pain). 02/13/22  Yes [provider]  fish oil-omega-3 fatty acids 1000 MG capsule Take 1 g by mouth daily.    Yes [provider]  fluticasone (FLONASE) 50 MCG/ACT nasal spray Place 2 sprays into both nostrils as needed for allergies or rhinitis.   Yes [provider]  lidocaine (LIDODERM) 5 % Place 1 patch onto the skin daily as needed (pain). Remove & Discard patch within 12 hours or as directed by MD   Yes [provider]  loratadine (CLARITIN) 10 MG tablet Take 10 mg by mouth daily as needed for allergies.   Yes [provider]  Multiple Vitamin (MULTIVITAMIN) tablet Take 1 tablet by mouth daily.     Yes [provider]  nitroGLYCERIN (NITROSTAT) 0.4 MG SL tablet Place 1 tablet (0.4 mg total) under the tongue every 5 (five) minutes x 3 doses as needed for chest pain. 08/15/21  Yes Weaver, Scott T, PA-C  omeprazole (PRILOSEC) 20 MG capsule Take 20 mg by mouth daily as needed (gerd/reflux).   Yes [provider]  PARoxetine (PAXIL) 40 MG tablet Take 40 mg by mouth daily as needed (for PTSD).   Yes [provider]  sacubitril-valsartan (ENTRESTO) 97-103 MG Take 1 tablet by mouth 2 (two) times daily. Patient taking differently: Take 0.5 tablets by mouth 2 (two) times daily. 12/17/21  Yes Tonny Bollman, MD  simvastatin (ZOCOR) 80 MG tablet Take 40 mg by mouth at bedtime. 12/20/11  Yes Tonny Bollman, MD  spironolactone (ALDACTONE) 25 MG tablet Take 1 tablet (25 mg total) by mouth daily. 01/12/21  Yes Allred, Fayrene Fearing, MD  Tetrahydrozoline HCl (VISINE OP) Place 1 drop into both eyes daily as needed (irritation).   Yes [provider]  UNABLE TO FIND Take 1-2 tablets by mouth See admin instructions. INV-CSP #2002  METFORMIN OR PLACEBO TAB  TAKE 1 TABLET BY MOUTH EVERY DAY WITH EVENING MEAL. WHEN INSTRUCTED, INCREASE DOSE TO 2 TABLETS DAILY WITH EVENING MEAL. DO NOT CRUSH, CHEW OR SPLIT.   Yes [provider]  vitamin C (ASCORBIC ACID) 500 MG tablet Take 500 mg by mouth daily.   Yes [provider]  Allergies Patient has no known allergies.  Review of Systems Review of Systems  Cardiovascular:  Positive for chest pain.   As noted in HPI  Physical Exam Vital Signs  I have reviewed the triage vital signs BP 98/74   Pulse 66   Temp (!) 97.3 F (36.3 C) (Oral)   Resp 17   Wt 112 kg   SpO2 94%   BMI 36.48 kg/m   Physical Exam Vitals reviewed.  Constitutional:      General: He is not in acute distress.    Appearance: He is well-developed. He is not diaphoretic.  HENT:     Head: Normocephalic and atraumatic.     Nose: Nose normal.  Eyes:     General: No scleral icterus.       Right eye: No discharge.        Left eye: No discharge.     Conjunctiva/sclera: Conjunctivae normal.     Pupils: Pupils are equal, round, and reactive to light.  Cardiovascular:     Rate and Rhythm: Normal rate and regular rhythm.     Heart sounds: No murmur heard.    No friction rub. No gallop.  Pulmonary:     Effort: Pulmonary effort is normal. No respiratory distress.     Breath sounds: Normal breath sounds. No stridor. No rales.  Abdominal:     General: There is no distension.     Palpations: Abdomen is soft.     Tenderness: There is no abdominal tenderness.  Musculoskeletal:        General: No tenderness.     Cervical back: Normal range of motion and neck supple.  Skin:    General: Skin is warm and dry.     Findings: No erythema or rash.  Neurological:     Mental Status: He is alert and oriented to person, place, and time.     ED Results and  Treatments Labs (all labs ordered are listed, but only abnormal results are displayed) Labs Reviewed  COMPREHENSIVE METABOLIC PANEL - Abnormal; Notable for the following components:      Result Value   Chloride 97 (*)    Glucose, Bld 116 (*)    Creatinine, Ser 1.30 (*)    Albumin 3.2 (*)    Anion gap 16 (*)    All other components within normal limits  BRAIN NATRIURETIC PEPTIDE - Abnormal; Notable for the following components:   B Natriuretic Peptide 219.0 (*)    All other components within normal limits  RESPIRATORY PANEL BY PCR  MAGNESIUM  TSH  T4, FREE  BASIC METABOLIC PANEL  CBC  MAGNESIUM                                                                                                                         EKG  EKG Interpretation Date/Time:  Monday December 02 2022 01:05:43 EDT Ventricular Rate:  69 PR Interval:  162 QRS Duration:  93 QT Interval:  425 QTC Calculation:  456 R Axis:   93  Text Interpretation: Sinus rhythm Multiple ventricular premature complexes Right axis deviation Borderline T wave abnormalities No significant change since last tracing Confirmed by Drema Pry [phone]) on 12/02/2022 1:12:04 AM       Radiology DG Chest Port 1 View  Result Date: 12/01/2022 CLINICAL DATA:  Chest pain.  Elevated heart rate. EXAM: PORTABLE CHEST 1 VIEW COMPARISON:  08/08/2010 FINDINGS: Cardiac pacemaker. Mild cardiac enlargement. No vascular congestion or edema. There is a focal nodular area of infiltration in the right upper lung laterally measuring 3.2 cm diameter. This is possible to represent a mass or focal consolidation. CT suggested for further evaluation. No pleural effusions. No pneumothorax. Mediastinal contours appear intact. IMPRESSION: 1. Cardiac enlargement. 2. Masslike opacity measuring 3.2 cm in the right upper lung, possibly focal consolidation or mass lesion. CT suggested for further evaluation. Electronically Signed   By: Burman Nieves M.D.   On:  12/01/2022 22:21    Medications Ordered in ED Medications  amiodarone (NEXTERONE PREMIX) 360-4.14 MG/200ML-% (1.8 mg/mL) IV infusion (60 mg/hr Intravenous New Bag/Given 12/02/22 0358)  aspirin EC tablet 325 mg (has no administration in time range)  carvedilol (COREG) tablet 25 mg (has no administration in time range)  nitroGLYCERIN (NITROSTAT) SL tablet 0.4 mg (has no administration in time range)  atorvastatin (LIPITOR) tablet 40 mg (has no administration in time range)  ARIPiprazole (ABILIFY) tablet 10 mg (has no administration in time range)  pantoprazole (PROTONIX) EC tablet 40 mg (has no administration in time range)  fluticasone (FLONASE) 50 MCG/ACT nasal spray 2 spray (has no administration in time range)  loratadine (CLARITIN) tablet 10 mg (has no administration in time range)  naphazoline-pheniramine (NAPHCON-A) 0.025-0.3 % ophthalmic solution 1 drop (has no administration in time range)  acetaminophen (TYLENOL) tablet 650 mg (has no administration in time range)  ondansetron (ZOFRAN) injection 4 mg (has no administration in time range)  enoxaparin (LOVENOX) injection 40 mg (has no administration in time range)  amiodarone (NEXTERONE PREMIX) 360-4.14 MG/200ML-% (1.8 mg/mL) IV infusion (30 mg/hr Intravenous New Bag/Given 12/02/22 0359)   Procedures Procedures  (including critical care time) Medical Decision Making / ED Course   Medical Decision Making Amount and/or Complexity of Data Reviewed Labs: ordered. Decision-making details documented in ED Course. ECG/medicine tests: ordered and independent interpretation performed. Decision-making details documented in ED Course.  Risk Prescription drug management. Decision regarding hospitalization.    EKG with normal sinus rhythm.  No other dysrhythmias or blocks. Only CBC and troponin ordered at [facility].  Will order metabolic panel with BNP here. CMP without significant electrolyte derangements.  Mild renal sufficiency  without AKI.  Mag 1.8.  BNP 219.  Clinical Course as of 12/02/22 0415  Mon Dec 02, 2022  0133 Spoke with Dr. Evette Doffing (cards fellow) who will evaluate patient for admission. [PC]    Clinical Course User Index [PC] Jenay Morici, Amadeo Garnet, MD   Patient admitted to cardiology service.  Final Clinical Impression(s) / ED Diagnoses Final diagnoses:  V tach (HCC)    This chart was dictated using voice recognition software.  Despite best efforts to proofread,  errors can occur which can change the documentation meaning.    Nira Conn, MD 12/02/22 [phone]

## 2022-12-02 NOTE — H&P (View-Only) (Signed)
ELECTROPHYSIOLOGY CONSULT NOTE    Patient ID: [name] [name] MRN: [id number], DOB/AGE: 1960/09/15 62 y.o.  Admit date: 12/02/2022 Date of Consult: 12/02/2022  Primary Physician: [facility] Primary Cardiologist: Tonny Bollman, MD  Electrophysiologist: Dr. Elberta Fortis   Referring Provider: Dr. Evette Doffing  Patient Profile: [name] [name] is a 62 y.o. male with a history of CAD, chronic sysotlic CHF, h/o VT and HTN who is being seen today for the evaluation of VT below detection at the request of Dr. Evette Doffing.  HPI:   With regards to cardiac history, he was seen by Dr. Elberta Fortis 03/2022 after ICD shock while chopping wood. LHC 04/01/22 showed single vessel CAD with total occlusion of mid to distal LAD within previously implanted stents and Left to left collaterals supplying apical LAD. He was treated medically. His last ICD interrogation was 10/29/22 which showed 1 NSVT episode, no therapy.    Pt presented to [facility] for complaints of "heart racing" that started at [facility] on 9/29 while at rest. He denied exertional symptoms, chest pain, SOB, fever, recent illness or appetite changes. He started to become hypotensive with SBP 70-80s in the ER, so Dr. Carolanne Grumbling was contacted who recommended synchronized cardioversion, amiodarone drip and transfer to [facility]. Per Dr. Mayford Knife, rhythm was wide complex tachycardia with similar axis to baseline rhythm, suggestive of 1:1 atrial flutter with aberrancy versus ventricular tachycardia. Patient did not receive shock from ICD. Patient underwent synchronized cardioversion back to NSR. He was placed on an IV amiodarone drip and transferred to [facility].   Pt feeling OK this am. Denies SOB, CP, fever, chills, sick contacts or medication non compliance.  He repeats that he was at rest when his tachy-palpitations started. No exertional symptoms.  Verbalizes understanding that driving restrictions will restart with additional VT  and defibrillation.   Labs Potassium3.9 (09/30 0347) Magnesium  2.0 (09/30 0347) Creatinine, ser  1.21 (09/30 0347) PLT  204 (09/30 0347) HGB  12.9* (09/30 0347) WBC 10.2 (09/30 0347) Troponin I (High Sensitivity)14 (09/29 2130).    Past Medical History:  Diagnosis Date   CAD (coronary artery disease)    anterior MI 2008, treated with bare metal stent to LAD   Chronic systolic heart failure (HCC) 06/27/2010   Ischemic CM s/p ICD // Large anterior MI in 2008 tx with BMS to LAD // cMRI 6/10:  Ant, inf, septal, apical AK with full thickness scar, EF 23% // Echo 4/12: Inferoapical, apical, septal and dist ant AK, inferobasal HK, mild LVH, EF 25-30%, mild MR, mild LAE    GERD (gastroesophageal reflux disease)    HLD (hyperlipidemia)    HTN (hypertension)    Ischemic cardiomyopathy    NYHA Class II/III CHF   MI (myocardial infarction) (HCC)    Pericarditis; 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   Umbilical hernia    Ventricular tachycardia (HCC) 07/29/2018   received appropriate ATP therapy for VT at 218 bp with syncope     Surgical History:  Past Surgical History:  Procedure Laterality Date   CARDIAC DEFIBRILLATOR PLACEMENT  08/07/2010   SJM Fortify VR ST implanted by Dr Johney Frame, part of the Analyze ST ICD study   DEFIBRILLATOR FUNCTION TEST     FINGER SURGERY  04/05/2007   laceration repair   ICD GENERATOR CHANGEOUT N/A 04/27/2021   Procedure: ICD GENERATOR CHANGEOUT;  Surgeon: Hillis Range, MD;  Location: [facility] INVASIVE CV LAB;  Service: Cardiovascular;  Laterality: N/A;   LEFT  HEART CATH AND CORONARY ANGIOGRAPHY N/A 04/01/2022   Procedure: LEFT HEART CATH AND CORONARY ANGIOGRAPHY;  Surgeon: Tonny Bollman, MD;  Location: [facility] INVASIVE CV LAB;  Service: Cardiovascular;  Laterality: N/A;     (Not in a hospital admission)   Inpatient Medications:   aspirin EC  325 mg Oral Daily   atorvastatin  40 mg Oral Daily   carvedilol  25 mg Oral BID WC   enoxaparin (LOVENOX) injection  40 mg  Subcutaneous Q24H   pantoprazole  40 mg Oral Daily    Allergies: No Known Allergies  Family History  Problem Relation Age of Onset   Hypertension Mother    Diabetes Mother    Heart Problems Father    Heart failure Unknown    Diabetes Unknown      Physical Exam: Vitals:   12/02/22 0515 12/02/22 0545 12/02/22 0600 12/02/22 0604  BP: 101/63 97/68 106/63 106/63  Pulse: 62 63 (!) 58 (!) 57  Resp: 16 14 15 15   Temp:    97.9 F (36.6 C)  TempSrc:    Oral  SpO2: (!) 82% 92% 96% 93%  Weight:        GEN- NAD, A&O x 3, normal affect HEENT: Normocephalic, atraumatic Lungs- CTAB, Normal effort.  Heart- Regular rate and rhythm, No M/G/R.  GI- Soft, NT, ND.  Extremities- No clubbing, cyanosis, or edema   Radiology/Studies: DG Chest Port 1 View  Result Date: 12/01/2022 CLINICAL DATA:  Chest pain.  Elevated heart rate. EXAM: PORTABLE CHEST 1 VIEW COMPARISON:  08/08/2010 FINDINGS: Cardiac pacemaker. Mild cardiac enlargement. No vascular congestion or edema. There is a focal nodular area of infiltration in the right upper lung laterally measuring 3.2 cm diameter. This is possible to represent a mass or focal consolidation. CT suggested for further evaluation. No pleural effusions. No pneumothorax. Mediastinal contours appear intact. IMPRESSION: 1. Cardiac enlargement. 2. Masslike opacity measuring 3.2 cm in the right upper lung, possibly focal consolidation or mass lesion. CT suggested for further evaluation. Electronically Signed   By: Burman Nieves M.D.   On: 12/01/2022 22:21    EKG: on arrival shows WCT in 190s, transition between V5 and V6, positive in inferior leads + aVR - aVL, - lead 1 (personally reviewed)  ? Of 1:1 AFL was brought up, though appears to have VA dissociation, so may represent retrograde conduction.   TELEMETRY: NSR 60s occasional PVCs (personally reviewed)  Device History: [employer] Single Chamber ICD implanted 08/07/2010 for Chronic systolic CHF History of  appropriate therapy: Yes History of AAD therapy: No     Assessment/Plan:  VT below detection Device previously programmed for shock/atp > 214 bpm only. Monitor zone started at 179 bpm Initial HS trop negative -> If follow up positive consider cath.  If follow up negative continue IV amiodarone today, consider po tomorrow.  Will adjust detection prior to d/c He verbalizes understanding that driving restrictions restart for another 6 months.   Chronic systolic dysfunction s/p Abbott single chamber ICD  Volume status appears OK.  Update Echo Continue GDMT as tolerated.  Full interrogation to adjust VT windows prior to d/c.  Continue coreg 25 mg BID, continue Entresto 97/103, Continue spiro 25   CAD Denies s/s ischemia LHC 03/2022 with CTO of LAD within prior stents with collaterals, otherwise widely patent vessels.  HS trop 14 -> pending.     HTN Follow and resume GDMT as tolerated  Further recommendations pending MD discussion and repeat troponin.   ADDENDUM Discussed with  MD and interventional cardiology. HS troponin resulted at >3000. Worth a re-look cath, though likely scar mediated VT. Will plan LHC later this morning/afternoon. Continue amiodarone.   For questions or updates, please contact [facility] Please consult [email or web address] for contact info under Cardiology/STEMI.  Dustin Flock, PA-C  12/02/2022 7:06 AM

## 2022-12-02 NOTE — ED Notes (Addendum)
EMTALA: REQUIRED DOCUMENTATION COMPLETED AND REVIEWED BY WRITER PRIOR TO PT TRANSFER  MD REASSESSMENT  [facility] RN SECTION  TRANSFER CONSENT SIGNED ON PAPER FORM   VS WITHIN REQUIRED TIME

## 2022-12-02 NOTE — ED Notes (Signed)
Critical trop provider made aware

## 2022-12-02 NOTE — ED Notes (Signed)
ED TO INPATIENT HANDOFF REPORT  ED Nurse Name and Phone #: Marchelle Folks [phone]  S Name/Age/Gender [name] [name] 62 y.o. male Room/Bed: 004C/004C  Code Status   Code Status: Full Code  Home/SNF/Other Home Patient oriented to: self, place, time, and situation Is this baseline? Yes   Triage Complete: Triage complete  Chief Complaint Wide-complex tachycardia [R00.0]  Triage Note Patient B[facility] c/o his AICD not firing and his heart rate being 200.  [facility] gave amiodarone bolus and cardioverted him.  Patient has been at [facility] since. Patient denies chest pain currently.    Allergies No Known Allergies  Level of Care/Admitting Diagnosis ED Disposition     ED Disposition  Admit   Condition  --   Comment  Hospital Area: [facility] [[id number]]  Level of Care: Telemetry Cardiac [[id number]]  May admit patient to [facility] or [facility] if equivalent level of care is available:: Yes  Covid Evaluation: Asymptomatic - no recent exposure (last 10 days) testing not required  Diagnosis: Wide-complex tachycardia [277517]  Admitting Physician: Willette Alma [[id number]]  Attending Physician: Willette Alma [[id number]]  Certification:: I certify this patient will need inpatient services for at least 2 midnights  Expected Medical Readiness: 12/04/2022          B Medical/Surgery History Past Medical History:  Diagnosis Date   CAD (coronary artery disease)    anterior MI 2008, treated with bare metal stent to LAD   Chronic systolic heart failure (HCC) 06/27/2010   Ischemic CM s/p ICD // Large anterior MI in 2008 tx with BMS to LAD // cMRI 6/10:  Ant, inf, septal, apical AK with full thickness scar, EF 23% // Echo 4/12: Inferoapical, apical, septal and dist ant AK, inferobasal HK, mild LVH, EF 25-30%, mild MR, mild LAE    GERD (gastroesophageal reflux disease)    HLD (hyperlipidemia)    HTN (hypertension)    Ischemic cardiomyopathy    NYHA Class II/III CHF   MI  (myocardial infarction) (HCC)    Pericarditis; 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   Umbilical hernia    Ventricular tachycardia (HCC) 07/29/2018   received appropriate ATP therapy for VT at 218 bp with syncope   Past Surgical History:  Procedure Laterality Date   CARDIAC DEFIBRILLATOR PLACEMENT  08/07/2010   SJM Fortify VR ST implanted by Dr Johney Frame, part of the Analyze ST ICD study   DEFIBRILLATOR FUNCTION TEST     FINGER SURGERY  04/05/2007   laceration repair   ICD GENERATOR CHANGEOUT N/A 04/27/2021   Procedure: ICD GENERATOR CHANGEOUT;  Surgeon: Hillis Range, MD;  Location: [facility] INVASIVE CV LAB;  Service: Cardiovascular;  Laterality: N/A;   LEFT HEART CATH AND CORONARY ANGIOGRAPHY N/A 04/01/2022   Procedure: LEFT HEART CATH AND CORONARY ANGIOGRAPHY;  Surgeon: Tonny Bollman, MD;  Location: [facility] INVASIVE CV LAB;  Service: Cardiovascular;  Laterality: N/A;     A IV Location/Drains/Wounds Patient Lines/Drains/Airways Status     Active Line/Drains/Airways     Name Placement date Placement time Site Days   Peripheral IV 12/01/22 18 G Left Forearm 12/01/22  2139  Forearm  1   Peripheral IV 12/02/22 Posterior;Proximal;Right Forearm 12/02/22  0150  Forearm  less than 1            Intake/Output Last 24 hours  Intake/Output Summary (Last 24 hours) at 12/02/2022 1420 Last data filed at 12/02/2022 1200 Gross per 24 hour  Intake --  Output 600 ml  Net -600  ml    Labs/Imaging Results for orders placed or performed during the hospital encounter of 12/02/22 (from the past 48 hour(s))  Comprehensive metabolic panel     Status: Abnormal   Collection Time: 12/02/22  1:34 AM  Result Value Ref Range   Sodium 136 135 - 145 mmol/L   Potassium 3.9 3.5 - 5.1 mmol/L   Chloride 97 (L) 98 - 111 mmol/L   CO2 23 22 - 32 mmol/L   Glucose, Bld 116 (H) 70 - 99 mg/dL    Comment: Glucose reference range applies only to samples taken after fasting for at least 8 hours.   BUN 20 8 - 23 mg/dL   Creatinine,  Ser 1.61 (H) 0.61 - 1.24 mg/dL   Calcium 9.0 8.9 - 09.6 mg/dL   Total Protein 6.8 6.5 - 8.1 g/dL   Albumin 3.2 (L) 3.5 - 5.0 g/dL   AST 36 15 - 41 U/L   ALT 27 0 - 44 U/L   Alkaline Phosphatase 41 38 - 126 U/L   Total Bilirubin 0.4 0.3 - 1.2 mg/dL   GFR, Estimated >04 >54 mL/min    Comment: (NOTE) Calculated using the CKD-EPI Creatinine Equation (2021)    Anion gap 16 (H) 5 - 15    Comment: Performed at [facility], [address] [place], [place] [address]  Magnesium     Status: None   Collection Time: 12/02/22  1:34 AM  Result Value Ref Range   Magnesium 1.8 1.7 - 2.4 mg/dL    Comment: Performed at [facility], [address], [place], [place] [address]  Brain natriuretic peptide     Status: Abnormal   Collection Time: 12/02/22  1:34 AM  Result Value Ref Range   B Natriuretic Peptide 219.0 (H) 0.0 - 100.0 pg/mL    Comment: Performed at [facility], [address] [place], [place] [address]  TSH     Status: None   Collection Time: 12/02/22  3:47 AM  Result Value Ref Range   TSH 2.333 0.350 - 4.500 uIU/mL    Comment: Performed by a 3rd Generation assay with a functional sensitivity of <=0.01 uIU/mL. Performed at [facility], [address], [place], [place] [address]   T4, free     Status: None   Collection Time: 12/02/22  3:47 AM  Result Value Ref Range   Free T4 0.99 0.61 - 1.12 ng/dL    Comment: (NOTE) Biotin ingestion may interfere with free T4 tests. If the results are inconsistent with the TSH level, previous test results, or the clinical presentation, then consider biotin interference. If needed, order repeat testing after stopping biotin. Performed at [facility], [address], [place], [place] [address]   Basic metabolic panel     Status: Abnormal   Collection Time: 12/02/22  3:47 AM  Result Value Ref Range   Sodium 133 (L) 135 - 145 mmol/L   Potassium 3.9 3.5 - 5.1 mmol/L   Chloride 101 98 - 111 mmol/L   CO2 23 22 - 32  mmol/L   Glucose, Bld 112 (H) 70 - 99 mg/dL    Comment: Glucose reference range applies only to samples taken after fasting for at least 8 hours.   BUN 21 8 - 23 mg/dL   Creatinine, Ser 9.62 0.61 - 1.24 mg/dL   Calcium 8.6 (L) 8.9 - 10.3 mg/dL   GFR, Estimated >95 >28 mL/min    Comment: (NOTE) Calculated  using the CKD-EPI Creatinine Equation (2021)    Anion gap 9 5 - 15    Comment: Performed at [facility], [address] [place], [place] [address]  CBC     Status: Abnormal   Collection Time: 12/02/22  3:47 AM  Result Value Ref Range   WBC 10.2 4.0 - 10.5 K/uL   RBC 4.38 4.22 - 5.81 MIL/uL   Hemoglobin 12.9 (L) 13.0 - 17.0 g/dL   HCT 60.6 30.1 - 60.1 %   MCV 89.0 80.0 - 100.0 fL   MCH 29.5 26.0 - 34.0 pg   MCHC 33.1 30.0 - 36.0 g/dL   RDW 09.3 23.5 - 57.3 %   Platelets 204 150 - 400 K/uL   nRBC 0.0 0.0 - 0.2 %    Comment: Performed at [facility], [address], [place], [place] [address]  Respiratory (~20 pathogens) panel by PCR     Status: None   Collection Time: 12/02/22  3:47 AM   Specimen: Nasopharyngeal Swab; Respiratory  Result Value Ref Range   Adenovirus NOT DETECTED NOT DETECTED   Coronavirus 229E NOT DETECTED NOT DETECTED    Comment: (NOTE) The Coronavirus on the Respiratory Panel, DOES NOT test for the novel  Coronavirus (2019 nCoV)    Coronavirus HKU1 NOT DETECTED NOT DETECTED   Coronavirus NL63 NOT DETECTED NOT DETECTED   Coronavirus OC43 NOT DETECTED NOT DETECTED   Metapneumovirus NOT DETECTED NOT DETECTED   Rhinovirus / Enterovirus NOT DETECTED NOT DETECTED   Influenza A NOT DETECTED NOT DETECTED   Influenza B NOT DETECTED NOT DETECTED   Parainfluenza Virus 1 NOT DETECTED NOT DETECTED   Parainfluenza Virus 2 NOT DETECTED NOT DETECTED   Parainfluenza Virus 3 NOT DETECTED NOT DETECTED   Parainfluenza Virus 4 NOT DETECTED NOT DETECTED   Respiratory Syncytial Virus NOT DETECTED NOT DETECTED   Bordetella pertussis NOT DETECTED NOT DETECTED    Bordetella Parapertussis NOT DETECTED NOT DETECTED   Chlamydophila pneumoniae NOT DETECTED NOT DETECTED   Mycoplasma pneumoniae NOT DETECTED NOT DETECTED    Comment: Performed at [facility], [address], [place], [place] [address]  Magnesium     Status: None   Collection Time: 12/02/22  3:47 AM  Result Value Ref Range   Magnesium 2.0 1.7 - 2.4 mg/dL    Comment: Performed at [facility], [address] [place], [place] [address]  Troponin I (High Sensitivity)     Status: Abnormal   Collection Time: 12/02/22  7:43 AM  Result Value Ref Range   Troponin I (High Sensitivity) 3,286 (HH) <18 ng/L    Comment: CRITICAL RESULT CALLED TO, READ BACK BY AND VERIFIED WITH A.Vignesh Willert,RN [id number] 12/02/22 CLARK,S (NOTE) Elevated high sensitivity troponin I (hsTnI) values and significant  changes across serial measurements may suggest ACS but many other  chronic and acute conditions are known to elevate hsTnI results.  Refer to the "Links" section for chest pain algorithms and additional  guidance. Performed at [facility], [address] [place], [place] [address]   Troponin I (High Sensitivity)     Status: Abnormal   Collection Time: 12/02/22  9:53 AM  Result Value Ref Range   Troponin I (High Sensitivity) 3,008 (HH) <18 ng/L    Comment: CRITICAL VALUE NOTED. VALUE IS CONSISTENT WITH PREVIOUSLY REPORTED/CALLED VALUE (NOTE) Elevated high sensitivity troponin I (hsTnI) values and significant  changes across serial measurements may suggest ACS but many other  chronic  and acute conditions are known to elevate hsTnI results.  Refer to the "Links" section for chest pain algorithms and additional  guidance. Performed at [facility], [address], [place], [place] [address]    ECHOCARDIOGRAM COMPLETE  Result Date: 12/02/2022    ECHOCARDIOGRAM REPORT   Patient Name:   [name] [name] Date of Exam: 12/02/2022 Medical Rec #:  [id number]          Height:       69.0 in  Accession #:    [id number]         Weight:       247.0 lb Date of Birth:  March 21, 1960          BSA:          2.260 m Patient Age:    61 years           BP:           107/67 mmHg Patient Gender: M                  HR:           56 bpm. Exam Location:  Inpatient Procedure: 2D Echo, Cardiac Doppler, Color Doppler and Intracardiac            Opacification Agent Indications:    cardiomyopathy  History:        Patient has prior history of Echocardiogram examinations, most                 recent 10/31/2020. CHF and Cardiomyopathy; Defibrillator.  Sonographer:    Melissa Morford RDCS (AE, PE) Referring Phys: [id number] [facility] TANNU  Sonographer Comments: Technically difficult study due to poor echo windows. IMPRESSIONS  1. Severely reduced CI by LVOT VTI (0.9L/min/m2). Left ventricular ejection fraction, by estimation, is 25 to 30%. The left ventricle has severely decreased function. Left ventricular endocardial border not optimally defined to evaluate regional wall motion but the mid-distal anterior walls, the apex, and mid-distal anterolateral wall appear akinetic. The left ventricular internal cavity size was severely dilated. There is moderate eccentric left ventricular hypertrophy. Left ventricular diastolic parameters are consistent with Grade II diastolic dysfunction (pseudonormalization).  2. Right ventricular systolic function was not well visualized. The right ventricular size is not well visualized.  3. Left atrial size was moderately dilated.  4. The mitral valve is grossly normal. Trivial mitral valve regurgitation. No evidence of mitral stenosis.  5. The aortic valve is tricuspid. Aortic valve regurgitation is not visualized. No aortic stenosis is present. FINDINGS  Left Ventricle: Severely reduced CI by LVOT VTI (0.9L/min/m2). Left ventricular ejection fraction, by estimation, is 25 to 30%. The left ventricle has severely decreased function. Left ventricular endocardial border not optimally defined to evaluate  regional wall motion. Definity contrast agent was given IV to delineate the left ventricular endocardial borders. The left ventricular internal cavity size was severely dilated. There is moderate eccentric left ventricular hypertrophy. Left ventricular diastolic parameters are consistent with Grade II diastolic dysfunction (pseudonormalization). Right Ventricle: The right ventricular size is not well visualized. Right vetricular wall thickness was not well visualized. Right ventricular systolic function was not well visualized. Left Atrium: Left atrial size was moderately dilated. Right Atrium: Right atrial size was not well visualized. Pericardium: There is no evidence of pericardial effusion. Mitral Valve: The mitral valve is grossly normal. Trivial mitral valve regurgitation. No evidence of mitral valve stenosis. Tricuspid Valve: The tricuspid valve is grossly normal. Tricuspid valve regurgitation is trivial.  Aortic Valve: The aortic valve is tricuspid. Aortic valve regurgitation is not visualized. No aortic stenosis is present. Pulmonic Valve: The pulmonic valve was not well visualized. Pulmonic valve regurgitation is trivial. Aorta: The aortic root and ascending aorta are structurally normal, with no evidence of dilitation. IAS/Shunts: The interatrial septum was not well visualized. Additional Comments: A device lead is visualized.  LEFT VENTRICLE PLAX 2D LVIDd:         6.40 cm   Diastology LVIDs:         5.10 cm   LV e' medial:    5.55 cm/s LV PW:         0.90 cm   LV E/e' medial:  13.4 LV IVS:        0.90 cm   LV e' lateral:   5.00 cm/s LVOT diam:     2.00 cm   LV E/e' lateral: 14.8 LV SV:         38 LV SV Index:   17 LVOT Area:     3.14 cm  LEFT ATRIUM              Index LA diam:        3.50 cm  1.55 cm/m LA Vol (A2C):   77.8 ml  34.43 ml/m LA Vol (A4C):   107.0 ml 47.35 ml/m LA Biplane Vol: 90.6 ml  40.09 ml/m  AORTIC VALVE LVOT Vmax:   50.90 cm/s LVOT Vmean:  37.400 cm/s LVOT VTI:    0.122 m  AORTA Ao  Root diam: 2.90 cm MITRAL VALVE MV Area (PHT): 5.02 cm    SHUNTS MV Decel Time: 151 msec    Systemic VTI:  0.12 m MV E velocity: 74.10 cm/s  Systemic Diam: 2.00 cm MV A velocity: 55.70 cm/s MV E/A ratio:  1.33 Clearnce Hasten Electronically signed by Clearnce Hasten Signature Date/Time: 12/02/2022/1:47:38 PM    Final    DG Chest Port 1 View  Result Date: 12/01/2022 CLINICAL DATA:  Chest pain.  Elevated heart rate. EXAM: PORTABLE CHEST 1 VIEW COMPARISON:  08/08/2010 FINDINGS: Cardiac pacemaker. Mild cardiac enlargement. No vascular congestion or edema. There is a focal nodular area of infiltration in the right upper lung laterally measuring 3.2 cm diameter. This is possible to represent a mass or focal consolidation. CT suggested for further evaluation. No pleural effusions. No pneumothorax. Mediastinal contours appear intact. IMPRESSION: 1. Cardiac enlargement. 2. Masslike opacity measuring 3.2 cm in the right upper lung, possibly focal consolidation or mass lesion. CT suggested for further evaluation. Electronically Signed   By: Burman Nieves M.D.   On: 12/01/2022 22:21    Pending Labs Unresulted Labs (From admission, onward)    None       Vitals/Pain Today's Vitals   12/02/22 0952 12/02/22 1000 12/02/22 1300 12/02/22 1334  BP:  106/65 111/67   Pulse:  60 (!) 59   Resp:  15 15   Temp: 97.6 F (36.4 C)   97.6 F (36.4 C)  TempSrc: Oral   Oral  SpO2:  97% 96%   Weight:      PainSc:        Isolation Precautions No active isolations  Medications Medications  amiodarone (NEXTERONE PREMIX) 360-4.14 MG/200ML-% (1.8 mg/mL) IV infusion (0 mg/hr Intravenous Stopped 12/02/22 1333)  aspirin EC tablet 325 mg (325 mg Oral Given 12/02/22 0926)  carvedilol (COREG) tablet 25 mg (25 mg Oral Given 12/02/22 0744)  nitroGLYCERIN (NITROSTAT) SL tablet 0.4 mg (has no administration in time range)  atorvastatin (LIPITOR) tablet 40 mg (40 mg Oral Given 12/02/22 0926)  ARIPiprazole (ABILIFY) tablet 10  mg (has no administration in time range)  pantoprazole (PROTONIX) EC tablet 40 mg (40 mg Oral Given 12/02/22 0926)  fluticasone (FLONASE) 50 MCG/ACT nasal spray 2 spray (has no administration in time range)  loratadine (CLARITIN) tablet 10 mg (has no administration in time range)  naphazoline-pheniramine (NAPHCON-A) 0.025-0.3 % ophthalmic solution 1 drop (has no administration in time range)  acetaminophen (TYLENOL) tablet 650 mg (has no administration in time range)  ondansetron (ZOFRAN) injection 4 mg (has no administration in time range)  enoxaparin (LOVENOX) injection 40 mg (40 mg Subcutaneous Given 12/02/22 0926)  amiodarone (NEXTERONE PREMIX) 360-4.14 MG/200ML-% (1.8 mg/mL) IV infusion (30 mg/hr Intravenous New Bag/Given 12/02/22 1332)  0.9 %  sodium chloride infusion ( Intravenous New Bag/Given 12/02/22 0747)  0.9 %  sodium chloride infusion (has no administration in time range)  perflutren lipid microspheres (DEFINITY) IV suspension (2 mLs Intravenous Given 12/02/22 1120)    Mobility walks     Focused Assessments Cardiac Assessment Handoff:  Cardiac Rhythm: Normal sinus rhythm No results found for: "CKTOTAL", "CKMB", "CKMBINDEX", "TROPONINI" No results found for: "DDIMER" Does the Patient currently have chest pain? No    R Recommendations: See Admitting Provider Note  Report given to:   Additional Notes: pt on amiodarone drip.

## 2022-12-03 ENCOUNTER — Encounter (HOSPITAL_COMMUNITY): Payer: Self-pay | Admitting: Cardiology

## 2022-12-03 DIAGNOSIS — R000 Tachycardia, unspecified: Secondary | ICD-10-CM | POA: Diagnosis not present

## 2022-12-03 MED ORDER — AMIODARONE HCL 200 MG PO TABS
400.0000 mg | ORAL_TABLET | Freq: Two times a day (BID) | ORAL | Status: DC
  Administered 2022-12-03 – 2022-12-04 (×3): 400 mg via ORAL
  Filled 2022-12-03 (×3): qty 2

## 2022-12-03 NOTE — TOC Initial Note (Signed)
Transition of Care [facility]) - Initial/Assessment Note    Patient Details  Name: [name] [name] MRN: [id number] Date of Birth: 25-Mar-1960  [facility] CM/SW Contact:    Leone Haven, RN Phone Number: 12/03/2022, 3:09 PM  Clinical Narrative:                 From home with spouse, [facility] and insurance on file, states has no HH services in place at this time or DME at home.  States family member will transport him home at [employer] and family is support system, states gets medications from [facility] in [place].  Pta self ambulatory.   Expected Discharge Plan: Home/Self Care Barriers to Discharge: Continued Medical Work up   Patient Goals and CMS Choice Patient states their goals for this hospitalization and ongoing recovery are:: return home   Choice offered to / list presented to : NA      Expected Discharge Plan and Services In-house Referral: NA Discharge Planning Services: CM Consult Post Acute Care Choice: NA Living arrangements for the past 2 months: Single Family Home                 DME Arranged: N/A DME Agency: NA       HH Arranged: NA          Prior Living Arrangements/Services Living arrangements for the past 2 months: Single Family Home Lives with:: Spouse Patient language and need for interpreter reviewed:: Yes Do you feel safe going back to the place where you live?: Yes      Need for Family Participation in Patient Care: Yes (Comment) Care giver support system in place?: Yes (comment)   Criminal Activity/Legal Involvement Pertinent to Current Situation/Hospitalization: No - Comment as needed  Activities of Daily Living   ADL Screening (condition at time of admission) Does the patient have a NEW difficulty with bathing/dressing/toileting/self-feeding that is expected to last >3 days?: No Does the patient have a NEW difficulty with getting in/out of bed, walking, or climbing stairs that is  expected to last >3 days?: No Does the patient have a NEW difficulty with communication that is expected to last >3 days?: No Is the patient deaf or have difficulty hearing?: No Does the patient have difficulty seeing, even when wearing glasses/contacts?: No Does the patient have difficulty concentrating, remembering, or making decisions?: No  Permission Sought/Granted Permission sought to share information with : Case Manager Permission granted to share information with : Yes, Verbal Permission Granted              Emotional Assessment Appearance:: Appears stated age Attitude/Demeanor/Rapport: Engaged Affect (typically observed): Appropriate Orientation: : Oriented to Place, Oriented to Self, Oriented to  Time, Oriented to Situation Alcohol / Substance Use: Not Applicable Psych Involvement: No (comment)  Admission diagnosis:  V tach (HCC) [I47.20] Wide-complex tachycardia [R00.0] Patient Active Problem List   Diagnosis Date Noted   Wide-complex tachycardia 12/02/2022   Cardiomyopathy (HCC) 03/13/2022   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   Chronic systolic (congestive) heart failure (HCC) 03/13/2022   Disorder of refraction 03/13/2022   Dry eyes 03/13/2022   Elevated blood-pressure reading without diagnosis of hypertension 03/13/2022   Major depressive disorder, single episode, unspecified 03/13/2022   Nasal congestion 03/13/2022   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   Pain in right toe(s) 03/13/2022   Presbyopia 03/13/2022   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   Paroxysmal ventricular tachycardia (HCC) 03/21/2021   Preoperative cardiovascular examination 03/21/2021  Meibomian gland dysfunction of right eye 01/12/2021   Major depressive disorder, recurrent, 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   Obstructive sleep apnea 01/12/2021   Implantable cardioverter-defibrillator (ICD) in situ 11/15/2011   HFrEF (heart failure with reduced ejection fraction) (HCC) 06/27/2010   Ischemic cardiomyopathy 09/07/2008    CAD (coronary artery disease) 07/24/2008   Hyperlipidemia LDL goal <70 07/19/2008   Essential hypertension 04/07/2006   GERD 04/07/2006   PCP:  [facility]:   [id number] - [place], [place] - [address] [place]  [address] Phone: [phone] Fax: [phone]  [facility] - [place], [place] - [address] [facility] [address] [place] [address] Phone: [phone] Fax: [phone]  [facility] [address] [place] [address] [place] [address] [place] [address] [place] [address] Phone: [phone] Fax: [phone]     Social Determinants of Health (SDOH) Social History: SDOH Screenings   Food Insecurity: No Food Insecurity (12/02/2022)  Housing: Low Risk  (12/02/2022)  Transportation Needs: No Transportation Needs (12/02/2022)  Utilities: Not At Risk (12/02/2022)  Financial Resource Strain: Low Risk  (10/12/2021)  Tobacco Use: Medium Risk (12/02/2022)   SDOH Interventions:     Readmission Risk Interventions     No data to display

## 2022-12-03 NOTE — Plan of Care (Signed)

## 2022-12-03 NOTE — Progress Notes (Signed)
   Heart Failure Stewardship Pharmacist Progress Note   PCP: [facility] PCP-Cardiologist: Tonny Bollman, MD    HPI:  62 yo M with PMH of CAD, CHF, HTN, and VT.   He was seen by EP in 03/2022 with an ICD shock while chopping wood. LHC showed single vessel CAD with total occlusion of mid to distal LAD within previous stents and L>L collaterals supplying the apical LAD.   Presented to the ED on 9/30 with complaints of chest pain, and palpitations - HR in the 200s. He received IV amiodarone and underwent DCCV to NSR. He was transferred from [facility] to [facility] for admission. ECHO on 9/30 showed EF 25-30%, moderate eccentric LVH, G2DD, RV not well visualized. LHC on 9/30 with no significant changes from January cath.   Current HF Medications: Beta Blocker: carvedilol 25 mg BID  Prior to admission HF Medications: Beta blocker: carvedilol 25 mg BID ACE/ARB/ARNI: Entresto 97/103 mg - taking 1/2 tab BID MRA: spironolactone 25 mg daily  Pertinent Lab Values: Serum creatinine 1.21, BUN 21, Potassium 3.9, Sodium 133, BNP 219, Magnesium 2.0   Vital Signs: Weight: 241 lbs (admission weight: 247 lbs) Blood pressure: 100-120/50s Heart rate: 50-60s  I/O: net +0.5L yesterday; net +0.3L since admission  Medication Assistance / Insurance Benefits Check: Does the patient have prescription insurance?  Yes Type of insurance plan: [facility]  Outpatient Pharmacy:  Prior to admission outpatient pharmacy: [facility] Is the patient willing to use [facility] TOC pharmacy at discharge? Yes Is the patient willing to transition their outpatient pharmacy to utilize a [facility] outpatient pharmacy?   No    Assessment: 1. Chronic systolic CHF (LVEF 25-30%), due to ICM. NYHA class II symptoms. - Not volume overloaded on exam - Continue carvedilol 25 mg BID - PTA Entresto and spironolactone held with soft BP on admission. BP has been coming up. Consider restarting spironolactone today - Has a history  of recurrent yeast infections with Jardiance. Discontinued in 02/2022.   Plan: 1) Medication changes recommended at this time: - Restart spironolactone 25 mg daily  2) Patient assistance: - Has insurance through the [facility]  3)  Education  - Patient has been educated on current HF medications and potential additions to HF medication regimen - Patient verbalizes understanding that over the next few months, these medication doses may change and more medications may be added to optimize HF regimen - Patient has been educated on basic disease state pathophysiology and goals of therapy   Sharen Hones, PharmD, BCPS Heart Failure Stewardship Pharmacist Phone [phone]

## 2022-12-03 NOTE — Progress Notes (Addendum)
Patient Name: [name] [name] Date of Encounter: 12/03/2022  Primary Cardiologist: Tonny Bollman, MD Electrophysiologist: Mamoudou Mulvehill Jorja Loa, MD  Interval Summary   The patient is doing well today.  At this time, the patient denies chest pain, shortness of breath, or any new concerns.  Vital Signs    Vitals:   12/03/22 0013 12/03/22 0305 12/03/22 0435 12/03/22 0745  BP: 106/62  105/69 (!) 111/53  Pulse: (!) 59 (!) 58 64 (!) 59  Resp: 18  18 18   Temp: 97.7 F (36.5 C)  98.3 F (36.8 C) 97.8 F (36.6 C)  TempSrc:   Oral Oral  SpO2: 95% 95% 94% 95%  Weight:   109.5 kg     Intake/Output Summary (Last 24 hours) at 12/03/2022 1036 Last data filed at 12/03/2022 0826 Gross per 24 hour  Intake 506.95 ml  Output 900 ml  Net -393.05 ml   Filed Weights   12/02/22 0103 12/03/22 0435  Weight: 112 kg 109.5 kg    Physical Exam    GEN- The patient is well appearing, alert and oriented x 3 today.   Lungs- Clear to ausculation bilaterally, normal work of breathing Cardiac- Regular with occ PVC's, no murmurs, rubs or gallops GI- soft, NT, ND, + BS Extremities- no clubbing or cyanosis. No edema  Telemetry    SR with PVC's (personally reviewed)  Hospital Course    [name] [name] is a 62 y.o. male with a history of CAD, systolic heart failure, ventricular tachycardia, HTN and ICD in situ, admitted with sustained wide-complex tachycardia.  He is followed by Dr. Elberta Fortis in the outpatient setting. He has a history of a chronic total occlusion of the LAD from a left heart catheterization in January 2024. The most recent remote interrogation of his ICD showed no sustained tachycardias requiring high-voltage therapies. Presented to [facility] on September 29 complaining of tachycardia that occurred at rest. He received a synchronized cardioversion back to normal sinus rhythm and was placed on an IV amiodarone gtt. LHC with stable chronic occlusion of LAD  Assessment & Plan    Wide  Complex Tachycardia  Ventricular Tachycardia  Despite high rate, tolerated from a hemodynamic standpoint.  He did not receive high voltage therapy as he was below his threshold on device given nominal settings. Repeat troponin was elevated. LHC was negative for new findings (known stable chronic occlusion of LAD).   -continue IV amiodarone with transition today to oral (400mg  BID) -tele monitoring   ICD in situ  -plan to re-program VT zones with lower monitoring zone and lower therapy zone with more aggressive ATP to treat further similar arrhythmias   Chronic Systolic Heart Failure  NYHA Class II-III -GDMT per Heart Failure    1540 Addendum: Reprogrammed VT Zones as follows -  VT Zone 1 150 monitor  VT Zone 2 171 ATP x8 at 85%, then ATP x8 at 82%, then shock x2  VF Unchanged at 214     For questions or updates, please contact [facility] Please consult [email or web address] for contact info under Cardiology/STEMI.  Signed, Canary Brim, MSN, APRN, NP-C, AGACNP-BC [facility] - Electrophysiology  12/03/2022, 10:36 AM  I have seen and examined this patient with Canary Brim.  Agree with above, note added to reflect my findings.  No further ventricular tachycardia.   GEN: Well nourished, well developed, in no acute distress  HEENT: normal  Neck: no JVD, carotid bruits, or masses Cardiac: RRR; no murmurs, rubs, or gallops,no edema  Respiratory:  clear to auscultation bilaterally, normal work of breathing GI: soft, nontender, nondistended, + BS MS: no deformity or atrophy  Skin: warm and dry, device site well healed Neuro:  Strength and sensation are intact Psych: euthymic mood, full affect   Ventricular tachycardia: No further episodes on IV amiodarone.  [name] [name] plan to switch from IV to p.o. amiodarone.  Device changes as above. Chronic systolic heart failure: No obvious volume overload.  Continue goal-directed medical therapy.  [name] [name] M. Edrei Norgaard MD 12/03/2022 4:28 PM

## 2022-12-03 NOTE — Discharge Summary (Incomplete)
ELECTROPHYSIOLOGY DISCHARGE SUMMARY    Patient ID: [name] [name],  MRN: [id number], DOB/AGE: August 24, 1960 62 y.o.  Admit date: 12/02/2022 Discharge date: 12/04/2022  Primary Care Physician: [facility]  Primary Cardiologist: Tonny Bollman, MD  Electrophysiologist: Dr. Elberta Fortis   Primary Discharge Diagnosis:  Wide Complex Tachycardia  Ventricular Tachycardia    Secondary Discharge Diagnosis:  ICD in Situ  Chronic Systolic Heart Failure  Procedures This Admission:  DCCV 12/02/22 (ER at [facility])  [facility] 9/30 > mid LAD to dist LAD lesion 100% stenosed, right dominant coronary circulation. Single vessel CAD with CTO of the mid to distal LAD throughout previously stented areas. Chronic calcification of the LV apex likely secondary to old anterior MI. LVEDP mildly elevated at 20. No significant changes from [facility] done in 03/2022      Brief HPI: [name] [name] is a 62 y.o. male with a history of CAD s/p MI with bare metal stent to LAD, HTN, HLD, chronic sCHF / NYHA II-III, VT, ICM s/p ICD admitted for ventricular tachycardia.   Hospital Course:  He is followed by Dr. Elberta Fortis in the outpatient setting. He has a history of a chronic total occlusion of the LAD from a left heart catheterization in January 2024. The most recent remote interrogation of his ICD showed no sustained tachycardias requiring high-voltage therapies.   Presented to [facility] on September 29 complaining of tachycardia that occurred at rest. The patient was admitted 12/02/22 for wide complex tachycardia.  He received a synchronized cardioversion back to normal sinus rhythm and was placed on an IV amiodarone gtt. LHC showed stable chronic occlusion of LAD. Telemetry monitoring overnight prior to discharge  demonstrated SB-SR. The patient was examined and considered to be stable for discharge. The patient will be seen back by EP APP in 3 weeks for post hospital care.  Discussed no driving with patient for 6 months  from 12/01/22, indicates verbal understanding.   Pt to continue amiodarone 400 mg BID through 12/19/22 then taper to 200mg  every day.  ICD VT zones reprogrammed as follows:  VT Zone 1 150 monitor  VT Zone 2 171 ATP x8 at 85%, then ATP x8 at 82%, then shock x2  VF Unchanged at 214  Physical Exam: Vitals:   12/03/22 1939 12/04/22 0044 12/04/22 0428 12/04/22 0820  BP: (!) 112/56 98/62 (!) 110/55 (!) 125/58  Pulse: 60 60 60 65  Resp:  18 18 18   Temp: 97.7 F (36.5 C) 97.7 F (36.5 C) 98.3 F (36.8 C) 97.7 F (36.5 C)  TempSrc: Oral Oral Oral Oral  SpO2: 96% 94% 97% 97%  Weight:        GEN- NAD. A&O x 3.  HEENT: Normocephalic, atraumatic Lungs- CTAB, Normal effort.  Heart- RRR, No M/G/R.  GI- Soft, NT, ND.  Extremities- No clubbing, cyanosis, or edema   Discharge Medications:  Allergies as of 12/04/2022   No Known Allergies      Medication List     TAKE these medications    acetaminophen 500 MG tablet Commonly known as: TYLENOL Take 1,000 mg by mouth every 8 (eight) hours as needed for moderate pain.   amiodarone 400 MG tablet Commonly known as: PACERONE Take 1 tablet (400 mg total) by mouth 2 (two) times daily for 14 days.   amiodarone 200 MG tablet Commonly known as: Pacerone Take 1 tablet (200 mg total) by mouth daily. Start taking on: December 19, 2022   ARIPiprazole 10 MG tablet Commonly known as:  ABILIFY Take 10 mg by mouth daily as needed (for ptsd).   ascorbic acid 500 MG tablet Commonly known as: VITAMIN C Take 500 mg by mouth daily.   aspirin EC 325 MG tablet Take 1 tablet by mouth daily.   aspirin-sod bicarb-citric acid 325 MG Tbef tablet Commonly known as: ALKA-SELTZER Take 650 mg by mouth every 6 (six) hours as needed (indigestion).   carvedilol 25 MG tablet Commonly known as: COREG Take 1 tablet (25 mg total) by mouth 2 (two) times daily with a meal.   cholecalciferol 1000 units tablet Commonly known as: VITAMIN D Take 1,000 Units by  mouth daily.   ciclopirox 8 % solution Commonly known as: PENLAC Apply 1 application  topically daily. APPLY DAILY TO FUNGAL TOE NAILS REMOVE WITH NAIL POLISH REMOVER ONCE WEEKLY APPLY DAILY TO FUNGAL TOE NAILS REMOVE WITH NAIL POLISH REMOVER ONCE WEEKLY FUNGAL TOE NAILS   diclofenac Sodium 1 % Gel Commonly known as: VOLTAREN Apply 2 g topically 4 (four) times daily as needed (shoulder pain).   Entresto 97-103 MG Generic drug: sacubitril-valsartan Take 1 tablet by mouth 2 (two) times daily. What changed: how much to take   fish oil-omega-3 fatty acids 1000 MG capsule Take 1 g by mouth daily.   fluticasone 50 MCG/ACT nasal spray Commonly known as: FLONASE Place 2 sprays into both nostrils as needed for allergies or rhinitis.   lidocaine 5 % Commonly known as: LIDODERM Place 1 patch onto the skin daily as needed (pain). Remove & Discard patch within 12 hours or as directed by MD   loratadine 10 MG tablet Commonly known as: CLARITIN Take 10 mg by mouth daily as needed for allergies.   multivitamin tablet Take 1 tablet by mouth daily.   nitroGLYCERIN 0.4 MG SL tablet Commonly known as: NITROSTAT Place 1 tablet (0.4 mg total) under the tongue every 5 (five) minutes x 3 doses as needed for chest pain.   omeprazole 20 MG capsule Commonly known as: PRILOSEC Take 20 mg by mouth daily as needed (gerd/reflux).   PARoxetine 40 MG tablet Commonly known as: PAXIL Take 40 mg by mouth daily as needed (for PTSD).   simvastatin 80 MG tablet Commonly known as: ZOCOR Take 40 mg by mouth at bedtime.   spironolactone 25 MG tablet Commonly known as: ALDACTONE Take 1 tablet (25 mg total) by mouth daily.   UNABLE TO FIND Take 1-2 tablets by mouth See admin instructions. INV-CSP #2002 METFORMIN OR PLACEBO TAB  TAKE 1 TABLET BY MOUTH EVERY DAY WITH EVENING MEAL. WHEN INSTRUCTED, INCREASE DOSE TO 2 TABLETS DAILY WITH EVENING MEAL. DO NOT CRUSH, CHEW OR SPLIT.   VISINE OP Place 1 drop  into both eyes daily as needed (irritation).        Disposition: Home with usual follow up as in AVS  Duration of Discharge Encounter: Greater than 30 minutes including physician time.  Signed, Canary Brim, MSN, APRN, NP-C, AGACNP-BC [facility] - Electrophysiology  12/04/2022, 11:47 AM    I have seen and examined this patient with Canary Brim.  Agree with above, note added to reflect my findings.  Patient admitted to the hospital after being found in VT.  VT was slower than detection zone on his ICD.  Received external cardioversion.  Left heart catheterization with no new disease.  Plan for discharge today on amiodarone.  GEN: Well nourished, well developed, in no acute distress  HEENT: normal  Neck: no JVD, carotid bruits, or masses Cardiac: RRR;  no murmurs, rubs, or gallops,no edema  Respiratory:  clear to auscultation bilaterally, normal work of breathing GI: soft, nontender, nondistended, + BS MS: no deformity or atrophy  Skin: warm and dry, device site well healed Neuro:  Strength and sensation are intact Psych: euthymic mood, full affect    Will M. [name] MD 12/05/2022 9:56 AM

## 2022-12-04 ENCOUNTER — Encounter (HOSPITAL_COMMUNITY): Payer: Self-pay | Admitting: Student

## 2022-12-04 ENCOUNTER — Other Ambulatory Visit (HOSPITAL_COMMUNITY): Payer: Self-pay

## 2022-12-04 DIAGNOSIS — R000 Tachycardia, unspecified: Secondary | ICD-10-CM | POA: Diagnosis not present

## 2022-12-04 LAB — LIPOPROTEIN A (LPA): Lipoprotein (a): 49.5 nmol/L — ABNORMAL HIGH (ref <75.0)

## 2022-12-04 MED ORDER — AMIODARONE HCL 200 MG PO TABS
200.0000 mg | ORAL_TABLET | Freq: Every day | ORAL | 6 refills | Status: DC
  Filled 2022-12-04: qty 72, 30d supply, fill #0

## 2022-12-04 MED ORDER — AMIODARONE HCL 400 MG PO TABS
400.0000 mg | ORAL_TABLET | Freq: Two times a day (BID) | ORAL | 0 refills | Status: DC
Start: 2022-12-04 — End: 2022-12-18
  Filled 2022-12-04: qty 14, 7d supply, fill #0

## 2022-12-04 NOTE — Plan of Care (Signed)
  Problem: Education: Goal: Understanding of CV disease, CV risk reduction, and recovery process will improve Outcome: Progressing   Problem: Activity: Goal: Ability to return to baseline activity level will improve Outcome: Progressing   Problem: Cardiovascular: Goal: Ability to achieve and maintain adequate cardiovascular perfusion will improve Outcome: Progressing Goal: Vascular access site(s) Level 0-1 will be maintained Outcome: Progressing   Problem: Health Behavior/Discharge Planning: Goal: Ability to safely manage health-related needs after discharge will improve Outcome: Progressing   Problem: Education: Goal: Knowledge of General Education information will improve Description: Including pain rating scale, medication(s)/side effects and non-pharmacologic comfort measures Outcome: Progressing   Problem: Health Behavior/Discharge Planning: Goal: Ability to manage health-related needs will improve Outcome: Progressing   Problem: Clinical Measurements: Goal: Ability to maintain clinical measurements within normal limits will improve Outcome: Progressing Goal: Will remain free from infection Outcome: Progressing Goal: Diagnostic test results will improve Outcome: Progressing Goal: Respiratory complications will improve Outcome: Progressing Goal: Cardiovascular complication will be avoided Outcome: Progressing   Problem: Activity: Goal: Risk for activity intolerance will decrease Outcome: Progressing   Problem: Nutrition: Goal: Adequate nutrition will be maintained Outcome: Progressing   Problem: Coping: Goal: Level of anxiety will decrease Outcome: Progressing   Problem: Elimination: Goal: Will not experience complications related to bowel motility Outcome: Progressing Goal: Will not experience complications related to urinary retention Outcome: Progressing   Problem: Pain Managment: Goal: General experience of comfort will improve Outcome: Progressing    Problem: Safety: Goal: Ability to remain free from injury will improve Outcome: Progressing   Problem: Skin Integrity: Goal: Risk for impaired skin integrity will decrease Outcome: Progressing

## 2022-12-04 NOTE — TOC Transition Note (Signed)
Transition of Care [facility]) - CM/SW Discharge Note   Patient Details  Name: [name] [name] MRN: [id number] Date of Birth: [birth year]  [facility] CM/SW Contact:  Leone Haven, RN Phone Number: 12/04/2022, 12:05 PM   Clinical Narrative:    Patient is for dc today, NCM called [facility] notification line, notification ID  is [phone].   He states his brogther , Brett Canales will transport him home today.     Final next level of care: Home/Self Care Barriers to Discharge: Continued Medical Work up   Patient Goals and CMS Choice   Choice offered to / list presented to : NA  Discharge Placement                         Discharge Plan and Services Additional resources added to the After Visit Summary for   In-house Referral: NA Discharge Planning Services: CM Consult Post Acute Care Choice: NA          DME Arranged: N/A DME Agency: NA       HH Arranged: NA          Social Determinants of Health (SDOH) Interventions SDOH Screenings   Food Insecurity: No Food Insecurity (12/02/2022)  Housing: Low Risk  (12/04/2022)  Transportation Needs: No Transportation Needs (12/02/2022)  Utilities: Not At Risk (12/02/2022)  Alcohol Screen: Low Risk  (12/04/2022)  Financial Resource Strain: Low Risk  (12/04/2022)  Tobacco Use: Medium Risk (12/02/2022)     Readmission Risk Interventions     No data to display

## 2022-12-04 NOTE — Consult Note (Signed)
Value-Based Care Institute  [facility] Inpatient Consult   12/04/2022  [name] [name] 1960-11-30 [id number]  [facility] [[facility]]  Accountable Care Organization [ACO] Patient: [employer] Care Provider: [facility]  is the provider he sees only   [facility] Liaison reviewed patient for care coordination needs  No needs noted at this time as progression team for no additional needs.  Patient not found on [facility] rosters.    Plan: No needs, coverage with [facility]   Community Care Management/Population Health does not replace or interfere with any arrangements made by the Inpatient Transition of Care team.   For questions contact:   Charlesetta Shanks, RN, BSN, CCM [place]  [facility], [facility] Liaison Direct Dial: [phone] or secure chat Website: [email or web address]

## 2022-12-04 NOTE — Progress Notes (Signed)
Heart Failure Nurse Navigator Progress Note  PCP: [facility] PCP-Cardiologist: [place] Admission Diagnosis: Ventricular tachycardia.  Admitted from: Home  Presentation:   [name] [name] presented with chest pain, heart racing, HR 198, has a defib, but no shock was administered. EKG showed monomorphic ventricular tachycardia. Patient cardioverted, IV amiodarone,   Patient was educated on the sign and symptoms of heart failure, daily weights, when to call his doctor or go to the ED, Diet/ fluid restrictions, daily weights, taking all medications as prescribed and attending all medical appointments. Patient verbalized his understanding, a HF TOC appointment was scheduled for 12/18/2022 @ 9 am.   ECHO/ LVEF: 25-30%  Clinical Course:  Past Medical History:  Diagnosis Date   CAD (coronary artery disease)    anterior MI 2008, treated with bare metal stent to LAD   Chronic systolic heart failure (HCC) 06/27/2010   Ischemic CM s/p ICD // Large anterior MI in 2008 tx with BMS to LAD // cMRI 6/10:  Ant, inf, septal, apical AK with full thickness scar, EF 23% // Echo 4/12: Inferoapical, apical, septal and dist ant AK, inferobasal HK, mild LVH, EF 25-30%, mild MR, mild LAE    GERD (gastroesophageal reflux disease)    HLD (hyperlipidemia)    HTN (hypertension)    Ischemic cardiomyopathy    NYHA Class II/III CHF   MI (myocardial infarction) (HCC)    Pericarditis; 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   Umbilical hernia    Ventricular tachycardia (HCC) 07/29/2018   received appropriate ATP therapy for VT at 218 bp with syncope     Social History   Socioeconomic History   Marital status: Married    Spouse name: Not on file   Number of children: Not on file   Years of education: Not on file   Highest education level: Not on file  Occupational History   Occupation: truck driver  Tobacco Use   Smoking status: Former    Types: Cigars    Quit date: 03/04/2006    Years since quitting:  16.7   Smokeless tobacco: Never  Vaping Use   Vaping status: Never Used  Substance and Sexual Activity   Alcohol use: Yes    Comment: occasionally   Drug use: No   Sexual activity: Not on file  Other Topics Concern   Not on file  Social History Narrative   Lives in [place] with spouse.  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   Social Determinants of Health   Financial Resource Strain: Low Risk  (10/12/2021)   Overall Financial Resource Strain (CARDIA)    Difficulty of Paying Living Expenses: Not very hard  Food Insecurity: No Food Insecurity (12/02/2022)   Hunger Vital Sign    Worried About Running Out of Food in the Last Year: Never true    Ran Out of Food in the Last Year: Never true  Transportation Needs: No Transportation Needs (12/02/2022)   PRAPARE - Administrator, Civil Service (Medical): No    Lack of Transportation (Non-Medical): No  Physical Activity: Not on file  Stress: Not on file  Social Connections: Not on file   Education Assessment and Provision:  Detailed education and instructions provided on heart failure disease management including the following:  Signs and symptoms of Heart Failure When to call the physician Importance of daily weights Low sodium diet Fluid restriction Medication management Anticipated future follow-up appointments  Patient education given on  each of the above topics.  Patient acknowledges understanding via teach back method and acceptance of all instructions.  Education Materials:  "Living Better With Heart Failure" Booklet, HF zone tool, & Daily Weight Tracker Tool.  Patient has scale at home: Yes Patient has pill box at home: yes    High Risk Criteria for Readmission and/or Poor Patient Outcomes: Heart failure hospital admissions (last 6 months): 0  No Show rate: 6 % Difficult social situation: No Demonstrates medication adherence: yes Primary  Language: English Literacy level: Reading, writing, and comprehension  Barriers of Care:   Diet/ fluid restrictions Daily weights  Considerations/Referrals:   Referral made to Heart Failure Pharmacist Stewardship: Yes Referral made to Heart Failure CSW/NCM TOC: No Referral made to Heart & Vascular TOC clinic: Yes, 12/18/2022 @ 9 am.   Items for Follow-up on DC/TOC: Continued HF education Daily weights Diet/ fluid restrictions.    Rhae Hammock, BSN, Scientist, clinical (histocompatibility and immunogenetics) Only

## 2022-12-18 ENCOUNTER — Ambulatory Visit (HOSPITAL_COMMUNITY)
Admit: 2022-12-18 | Discharge: 2022-12-18 | Disposition: A | Discharge status: Home / Self Care | Payer: Non-veteran care | Attending: Cardiology | Admitting: Cardiology

## 2022-12-18 ENCOUNTER — Encounter (HOSPITAL_COMMUNITY): Payer: Self-pay

## 2022-12-18 ENCOUNTER — Telehealth (HOSPITAL_COMMUNITY): Payer: Self-pay

## 2022-12-18 VITALS — BP 110/76 | HR 58 | Wt 239.0 lb

## 2022-12-18 DIAGNOSIS — I255 Ischemic cardiomyopathy: Secondary | ICD-10-CM

## 2022-12-18 DIAGNOSIS — I110 Hypertensive heart disease with heart failure: Secondary | ICD-10-CM | POA: Diagnosis not present

## 2022-12-18 DIAGNOSIS — Z95810 Presence of automatic (implantable) cardiac defibrillator: Secondary | ICD-10-CM | POA: Diagnosis not present

## 2022-12-18 DIAGNOSIS — I252 Old myocardial infarction: Secondary | ICD-10-CM | POA: Diagnosis not present

## 2022-12-18 DIAGNOSIS — Z7984 Long term (current) use of oral hypoglycemic drugs: Secondary | ICD-10-CM | POA: Diagnosis not present

## 2022-12-18 DIAGNOSIS — Z7982 Long term (current) use of aspirin: Secondary | ICD-10-CM | POA: Insufficient documentation

## 2022-12-18 DIAGNOSIS — I2510 Atherosclerotic heart disease of native coronary artery without angina pectoris: Secondary | ICD-10-CM | POA: Insufficient documentation

## 2022-12-18 DIAGNOSIS — I4729 Other ventricular tachycardia: Secondary | ICD-10-CM | POA: Diagnosis not present

## 2022-12-18 DIAGNOSIS — Z79899 Other long term (current) drug therapy: Secondary | ICD-10-CM | POA: Insufficient documentation

## 2022-12-18 DIAGNOSIS — Z955 Presence of coronary angioplasty implant and graft: Secondary | ICD-10-CM | POA: Diagnosis not present

## 2022-12-18 DIAGNOSIS — I5022 Chronic systolic (congestive) heart failure: Secondary | ICD-10-CM | POA: Diagnosis not present

## 2022-12-18 LAB — BASIC METABOLIC PANEL
Anion gap: 9 (ref 5–15)
BUN: 15 mg/dL (ref 8–23)
CO2: 26 mmol/L (ref 22–32)
Calcium: 9.2 mg/dL (ref 8.9–10.3)
Chloride: 102 mmol/L (ref 98–111)
Creatinine, Ser: 1.05 mg/dL (ref 0.61–1.24)
GFR, Estimated: >60 mL/min (ref >60)
Glucose, Bld: 112 mg/dL — ABNORMAL HIGH (ref 70–99)
Potassium: 4.1 mmol/L (ref 3.5–5.1)
Sodium: 137 mmol/L (ref 135–145)

## 2022-12-18 LAB — BRAIN NATRIURETIC PEPTIDE: B Natriuretic Peptide: 103 pg/mL — ABNORMAL HIGH (ref 0.0–100.0)

## 2022-12-18 MED ORDER — FUROSEMIDE 20 MG PO TABS
ORAL_TABLET | ORAL | 3 refills | Status: AC
Start: 2022-12-18 — End: ?

## 2022-12-18 NOTE — Telephone Encounter (Signed)
-----   Message from  sent at 12/18/2022  1:40 PM EDT ----- Labs stable. BNP only mildly elevated. Send Rx for Lasix 20 mg PRN. Can take if > 3lb wt gain in 24 hr or > 5 lb in 1 wk or development of leg swelling.

## 2022-12-18 NOTE — Progress Notes (Signed)
HEART & VASCULAR TRANSITION OF CARE CONSULT NOTE     Referring Physician: Dr. Elberta Fortis Primary Care: [facility] Primary Cardiologist: Tonny Bollman, MD  HPI: Referred to clinic by Dr. Elberta Fortis for heart failure consultation.   62 y/o male with CAD, status post anterior MI in 2008 treated with a BMS to the LAD, chronic systolic heart failure due to ischemic cardiomyopathy with an EF of 25-30% and VT status post AICD.     Admitted 2x this year for VT treated w/ ICD shocks. Admit 1/24 underwent LHC showing single-vessel coronary artery disease with total occlusion of the mid to distal LAD within the previously implanted stents, left to left collaterals present supplying the apical LAD, widely patent left main, left circumflex, ramus intermedius, and RCA, with minimal irregularity but no significant stenose. CAD managed medically.   Admitted again 9/24 for VT. Required external defibrillations and loaded w/ amiodarone. LHC repeated and unchanged from prior study, mid LAD to [facility] LAD lesion is 100% stenosed. No other disease. LV apex also noted to be chronically calcified, likely 2/2 old MI. LVEDP mildly elevated at 20. Echo during admit demonstrated LVEF 25-30%, RV no well visualized. Echo also noted severely reduced CI by LVOT VTI (0.9L/min/m2). Of note, RHC was not done during cardiac catheterization. He was continued on medical therapy and referred to [facility] clinic.   He presents today for assessment. Reports doing fairly well. Device interrogated today and shows no further VT episodes. EKG shows SB, 57 bpm. He denies CP. No palpitations. From a functional standpoint, he has been able to do most ALDs w/o significant limitation, only mild exertional dyspnea w/ moderate level activities. Denies LEE. No orthopnea/PND.   Reports full med compliance and tolerating well w/o side effects. He was previously on Jardiance but had to d/c due to frequent GU infections. BP well controlled,  110/76.    Cardiac Testing  LHC 12/02/22   Mid LAD to Dist LAD lesion is 100% stenosed.   Right dominant coronary circulation.  Single vessel CAD with CTO of the mid to distal LAD throughout previously stented areas.  Chronic calcification of the LV apex likely secondary to old anterior MI.  LVEDP mildly elevated at 20.  No significant changes from LHC done in 04/01/22.   2D Echo 12/02/22  1. Severely reduced CI by LVOT VTI (0.9L/min/m2). Left ventricular  ejection fraction, by estimation, is 25 to 30%. The left ventricle has  severely decreased function. Left ventricular endocardial border not  optimally defined to evaluate regional wall  motion but the mid-distal anterior walls, the apex, and mid-distal  anterolateral wall appear akinetic. The left ventricular internal cavity  size was severely dilated. There is moderate eccentric left ventricular  hypertrophy. Left ventricular diastolic  parameters are consistent with Grade II diastolic dysfunction  (pseudonormalization).   2. Right ventricular systolic function was not well visualized. The right  ventricular size is not well visualized.   3. Left atrial size was moderately dilated.   4. The mitral valve is grossly normal. Trivial mitral valve  regurgitation. No evidence of mitral stenosis.   5. The aortic valve is tricuspid. Aortic valve regurgitation is not  visualized. No aortic stenosis is present.       Review of Systems: [y] = yes, [ ]  = no   General: Weight gain [ ] ; Weight loss [ ] ; Anorexia [ ] ; Fatigue [ ] ; Fever [ ] ; Chills [ ] ; Weakness [ ]   Cardiac: Chest pain/pressure [ ] ;  Resting SOB [ ] ; Exertional SOB [ ] ; Orthopnea [ ] ; Pedal Edema [ ] ; Palpitations [ ] ; Syncope [ ] ; Presyncope [ ] ; Paroxysmal nocturnal dyspnea[ ]   Pulmonary: Cough [ ] ; Wheezing[ ] ; Hemoptysis[ ] ; Sputum [ ] ; Snoring [ ]   GI: Vomiting[ ] ; Dysphagia[ ] ; Melena[ ] ; Hematochezia [ ] ; Heartburn[ ] ; Abdominal pain [ ] ; Constipation [ ] ; Diarrhea  [ ] ; BRBPR [ ]   GU: Hematuria[ ] ; Dysuria [ ] ; Nocturia[ ]   Vascular: Pain in legs with walking [ ] ; Pain in feet with lying flat [ ] ; Non-healing sores [ ] ; Stroke [ ] ; TIA [ ] ; Slurred speech [ ] ;  Neuro: Headaches[ ] ; Vertigo[ ] ; Seizures[ ] ; Paresthesias[ ] ;Blurred vision [ ] ; Diplopia [ ] ; Vision changes [ ]   Ortho/Skin: Arthritis [ ] ; Joint pain [ ] ; Muscle pain [ ] ; Joint swelling [ ] ; Back Pain [ ] ; Rash [ ]   Psych: Depression[ ] ; Anxiety[ ]   Heme: Bleeding problems [ ] ; Clotting disorders [ ] ; Anemia [ ]   Endocrine: Diabetes [ ] ; Thyroid dysfunction[ ]    Past Medical History:  Diagnosis Date   CAD (coronary artery disease)    anterior MI 2008, treated with bare metal stent to LAD   Chronic systolic heart failure (HCC) 06/27/2010   Ischemic CM s/p ICD // Large anterior MI in 2008 tx with BMS to LAD // cMRI 6/10:  Ant, inf, septal, apical AK with full thickness scar, EF 23% // Echo 4/12: Inferoapical, apical, septal and dist ant AK, inferobasal HK, mild LVH, EF 25-30%, mild MR, mild LAE    GERD (gastroesophageal reflux disease)    HLD (hyperlipidemia)    HTN (hypertension)    Ischemic cardiomyopathy    NYHA Class II/III CHF   MI (myocardial infarction) (HCC)    Pericarditis; 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   Umbilical hernia    Ventricular tachycardia (HCC) 07/29/2018   received appropriate ATP therapy for VT at 218 bp with syncope    Current Outpatient Medications  Medication Sig Dispense Refill   acetaminophen (TYLENOL) 500 MG tablet Take 1,000 mg by mouth every 8 (eight) hours as needed for moderate pain.     [START ON 12/19/2022] amiodarone (PACERONE) 200 MG tablet Take 2 tablets (400 mg total) by mouth 2 (two) times daily for 14 days. Then, take 1 tablet (200 mg total) by mouth daily. 86 tablet 6   ARIPiprazole (ABILIFY) 10 MG tablet Take 10 mg by mouth daily as needed (for ptsd).     aspirin EC 325 MG tablet Take 1 tablet by mouth daily.     aspirin-sod bicarb-citric acid  (ALKA-SELTZER) 325 MG TBEF tablet Take 650 mg by mouth every 6 (six) hours as needed (indigestion).     carvedilol (COREG) 25 MG tablet Take 1 tablet (25 mg total) by mouth 2 (two) times daily with a meal. 60 tablet 0   cholecalciferol (VITAMIN D) 1000 UNITS tablet Take 1,000 Units by mouth daily.     ciclopirox (PENLAC) 8 % solution Apply 1 application  topically daily. APPLY DAILY TO FUNGAL TOE NAILS REMOVE WITH NAIL POLISH REMOVER ONCE WEEKLY APPLY DAILY TO FUNGAL TOE NAILS REMOVE WITH NAIL POLISH REMOVER ONCE WEEKLY FUNGAL TOE NAILS     diclofenac Sodium (VOLTAREN) 1 % GEL Apply 2 g topically 4 (four) times daily as needed (shoulder pain).     fish oil-omega-3 fatty acids 1000 MG capsule Take 1 g by mouth daily.      fluticasone (FLONASE) 50 MCG/ACT nasal  spray Place 2 sprays into both nostrils as needed for allergies or rhinitis.     lidocaine (LIDODERM) 5 % Place 1 patch onto the skin daily as needed (pain). Remove & Discard patch within 12 hours or as directed by MD     loratadine (CLARITIN) 10 MG tablet Take 10 mg by mouth daily as needed for allergies.     Multiple Vitamin (MULTIVITAMIN) tablet Take 1 tablet by mouth daily.       nitroGLYCERIN (NITROSTAT) 0.4 MG SL tablet Place 1 tablet (0.4 mg total) under the tongue every 5 (five) minutes x 3 doses as needed for chest pain. 25 tablet 1   omeprazole (PRILOSEC) 20 MG capsule Take 20 mg by mouth daily as needed (gerd/reflux).     PARoxetine (PAXIL) 40 MG tablet Take 40 mg by mouth daily as needed (for PTSD).     sacubitril-valsartan (ENTRESTO) 97-103 MG Take 1 tablet by mouth 2 (two) times daily. (Patient taking differently: Take 0.5 tablets by mouth 2 (two) times daily.) 180 tablet 2   simvastatin (ZOCOR) 80 MG tablet Take 40 mg by mouth at bedtime.     spironolactone (ALDACTONE) 25 MG tablet Take 1 tablet (25 mg total) by mouth daily. 90 tablet 3   Tetrahydrozoline HCl (VISINE OP) Place 1 drop into both eyes daily as needed (irritation).      UNABLE TO FIND Take 1-2 tablets by mouth See admin instructions. INV-CSP #2002 METFORMIN OR PLACEBO TAB  TAKE 1 TABLET BY MOUTH EVERY DAY WITH EVENING MEAL. WHEN INSTRUCTED, INCREASE DOSE TO 2 TABLETS DAILY WITH EVENING MEAL. DO NOT CRUSH, CHEW OR SPLIT.     vitamin C (ASCORBIC ACID) 500 MG tablet Take 500 mg by mouth daily.     No current facility-administered medications for this encounter.    No Known Allergies    Social History   Socioeconomic History   Marital status: Married    Spouse name: Drinda Butts   Number of children: 2   Years of education: Not on file   Highest education level: High school graduate  Occupational History   Occupation: truck driver   Occupation: Retired  Tobacco Use   Smoking status: Former    Types: Cigars    Quit date: 03/04/2006    Years since quitting: 16.8   Smokeless tobacco: Never  Vaping Use   Vaping status: Never Used  Substance and Sexual Activity   Alcohol use: Yes    Comment: occasionally   Drug use: No   Sexual activity: Not on file  Other Topics Concern   Not on file  Social History Narrative   Lives in [place] with spouse.  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   Social Determinants of Health   Financial Resource Strain: Low Risk  (12/04/2022)   Overall Financial Resource Strain (CARDIA)    Difficulty of Paying Living Expenses: Not hard at all  Food Insecurity: No Food Insecurity (12/02/2022)   Hunger Vital Sign    Worried About Running Out of Food in the Last Year: Never true    Ran Out of Food in the Last Year: Never true  Transportation Needs: No Transportation Needs (12/02/2022)   PRAPARE - Administrator, Civil Service (Medical): No    Lack of Transportation (Non-Medical): No  Physical Activity: Not on file  Stress: Not on file  Social Connections: Not on file  Intimate Partner Violence: Not At Risk (12/02/2022)   Humiliation,  Afraid, Rape, and  Kick questionnaire    Fear of Current or Ex-Partner: No    Emotionally Abused: No    Physically Abused: No    Sexually Abused: No      Family History  Problem Relation Age of Onset   Hypertension Mother    Diabetes Mother    Heart Problems Father    Heart failure Unknown    Diabetes Unknown     Vitals:   12/18/22 0857  BP: 110/76  Pulse: (!) 58  SpO2: 96%  Weight: 108.4 kg (239 lb)    PHYSICAL EXAM: General:  Well appearing. No respiratory difficulty HEENT: normal Neck: supple. no JVD. Carotids 2+ bilat; no bruits. No lymphadenopathy or thryomegaly appreciated. Cor: PMI nondisplaced. Regular rate & rhythm. No rubs, gallops or murmurs. Lungs: clear Abdomen: soft, nontender, nondistended. No hepatosplenomegaly. No bruits or masses. Good bowel sounds. Extremities: no cyanosis, clubbing, rash, edema Neuro: alert & oriented x 3, cranial nerves grossly intact. moves all 4 extremities w/o difficulty. Affect pleasant.  ECG: sinus bradycardia 57 bpm, personally reviewed    ASSESSMENT & PLAN:  1. Chronic Systolic Heart Failure - Longstanding, due to Ischemic CM, s/p anterior MI in 2008 treated with a BMS to the LAD, now w/ CTO of the mid-distal LAD throughout previously stented areas - Most recent echo 9/24 EF 25-30% RV not well visualized. Echo also noted severely reduced CI by LVOT VTI (0.9L/min/m2), however this does not correlate w/ his basline functional status - NYHA Class II. He is not on a loop diuretic. He does not appear grossly volume overloaded however exam limited some by body habitus. LVEDP during recent cath was mildly elevated at 20 and hospital BNP was also mildly elevated at 219. I think he would benefit from low dose diuretic. Will repeat BNP level today. If still elevated, will prescribed lasix. May only need 2-3 days/wk or just PRN  - Continue Entresto 97-103 mg bid - Continue Spironolactone 25 mg daily  - Failed SGLT2i (Jardiance) due to frequent GU  infections - Continue Coreg 25 mg bid - BP too soft for Bidil  - will not use digoxin given bradycardia    - worry about trajectory of heart failure w/ now CTO of LAD.  May need eventual advanced therapies. Will refer to the [facility] so that he can be followed closely (Assign to Dr. Gasper Lloyd). May need CPX soon.    2. CAD - anterior MI 2008, PCI to LAD - [facility] 1/24 and 9/24 w/ 1V CAD w/ now CTO of mid-distal LAD, medical management  - stable w/o CP  - on ASA, statin + ? blocker - followed by Dr. Excell Seltzer   3. VT  - hospitalized 1/24 + 9/24 for VT - likely scar mediated, in setting of prior anterior MI/chronic systolic heart failure - has ICD, followed by Dr. Elberta Fortis - on Amiodarone, recently tapered down per EP, now on 200 mg daily - device interrogation today shows no further VT episodes since 9/29 - no driving x 6 months     Referred to HFSW (PCP, Medications, Transportation, ETOH Abuse, Drug Abuse, Insurance, Financial ): No Refer to Pharmacy:  No Refer to Home Health:  No Refer to Advanced Heart Failure Clinic: Yes (Dr. Gasper Lloyd) Refer to General Cardiology: Yes (shared care, followed by Dr. Excell Seltzer and Dr. Elberta Fortis)   Follow up  in 4 wks w/ Dr. Elaina Hoops, PA-C  12/18/2022

## 2022-12-18 NOTE — Telephone Encounter (Signed)
Patient's medication has been sent to pharmacy. Pt aware, agreeable, and verbalized understanding his lab results.

## 2022-12-18 NOTE — Patient Instructions (Addendum)
EKG done today.   Labs done today. We will contact you only if your labs are abnormal.  No medication changes were made. Please continue all current medications as prescribed.  Your physician recommends that you schedule a follow-up appointment in: 3 weeks with Dr. Gasper [name] here in our office   If you have any questions or concerns before your next appointment please send [place] a message through [facility] or call our office at [phone].    TO LEAVE A MESSAGE FOR THE NURSE SELECT OPTION 2, PLEASE LEAVE A MESSAGE INCLUDING: YOUR NAME DATE OF BIRTH CALL BACK NUMBER REASON FOR CALL**this is important as we prioritize the call backs  YOU WILL RECEIVE A CALL BACK THE SAME DAY AS LONG AS YOU CALL BEFORE 4:00 PM   Do the following things EVERYDAY: Weigh yourself in the morning before breakfast. Write it down and keep it in a log. Take your medicines as prescribed Eat low salt foods--Limit salt (sodium) to 2000 mg per day.  Stay as active as you can everyday Limit all fluids for the day to less than 2 liters   At the Advanced [facility] Clinic, you and your health needs are our priority. As part of our continuing mission to provide you with exceptional heart care, we have created designated Provider Care Teams. These Care Teams include your primary Cardiologist (physician) and Advanced Practice Providers (APPs- Physician Assistants and Nurse Practitioners) who all work together to provide you with the care you need, when you need it.   You may see any of the following providers on your designated Care Team at your next follow up: Dr Arvilla Meres Dr Marca Ancona Dr. Marcos Eke, NP Robbie Lis, [place] [facility] [place], [place] Brynda Peon, NP Karle Plumber, PharmD   Please be sure to bring in all your medications bottles to every appointment.    Thank you for choosing [facility]

## 2022-12-19 ENCOUNTER — Other Ambulatory Visit (HOSPITAL_COMMUNITY): Payer: Self-pay

## 2022-12-19 ENCOUNTER — Other Ambulatory Visit (HOSPITAL_COMMUNITY): Payer: Self-pay | Admitting: Cardiology

## 2022-12-19 MED ORDER — AMIODARONE HCL 200 MG PO TABS: 200.0000 mg | ORAL_TABLET | Freq: Every day | ORAL | 3 refills | Status: DC

## 2022-12-19 NOTE — Telephone Encounter (Signed)
Pt called to request refill of amio to outside pharmacy

## 2022-12-23 ENCOUNTER — Ambulatory Visit: Payer: No Typology Code available for payment source | Attending: Cardiology

## 2022-12-23 DIAGNOSIS — I5022 Chronic systolic (congestive) heart failure: Secondary | ICD-10-CM

## 2022-12-23 DIAGNOSIS — Z95810 Presence of automatic (implantable) cardiac defibrillator: Secondary | ICD-10-CM

## 2022-12-24 NOTE — Progress Notes (Signed)
EPIC Encounter for ICM Monitoring  Patient Name: [name] [name] is a 62 y.o. male Date: 12/24/2022 Primary Care Physican: [facility] Primary Cardiologist: [name]/Sabharwal Electrophysiologist: [name] 11/12/2021 Weight: 231 lbs    04/26/2022 Weight: 241-242 lbs    07/05/2022 Weight: 240 lbs    08/08/2022 Weight: 240 lbs    12/24/2022 Weight: 240 lbs                            Spoke with patient and heart failure questions reviewed.  Transmission results reviewed.  Pt asymptomatic for fluid accumulation.  Reports feeling well at this time and voices no complaints.  Hospitalized for VT 9/30   CorVue thoracic impedance suggesting possible fluid accumulation starting 10/17 and returned closed to baseline 10/21.    Prescribed:  Furosemide 20 mg take 1 tablet by mouth PRN.  Can take if > 3 lbs weight gain in 24 hours or > 5 lbs in a week or developing of leg swelling Spironolactone 25 mg take 1 tablet daily   Labs: 08/26/2022 Creatinine 0.99, BUN 10, Potassium 4.5, Sodium 140, GFR 86  A complete set of results can be found in Results Review.   Recommendations:  Did not advised to take PRN Furosemide since fluid levels have returned close to normal but did discuss to take PRN Furosemide when symptoms occur.   Advised to limit salt and fluid intake and will recheck fluid levels next week.   Follow-up plan: ICM clinic phone appointment on 12/30/2022 to recheck fluid levels.   91 day device clinic remote transmission 01/28/2023.     EP/Cardiology Office Visits:  02/28/2023 with Dr Elberta Fortis.  Recall 03/19/2023 with Dr Earmon Phoenix APP.   Copy of ICM check sent to Dr. Elberta Fortis.      3 month ICM trend: 12/23/2022.    12-14 Month ICM trend:     Karie Soda, RN 12/24/2022 3:04 PM

## 2022-12-30 ENCOUNTER — Ambulatory Visit: Payer: No Typology Code available for payment source | Attending: Cardiology

## 2022-12-30 DIAGNOSIS — Z95810 Presence of automatic (implantable) cardiac defibrillator: Secondary | ICD-10-CM

## 2022-12-30 DIAGNOSIS — I5022 Chronic systolic (congestive) heart failure: Secondary | ICD-10-CM

## 2023-01-01 ENCOUNTER — Telehealth: Payer: Self-pay

## 2023-01-01 NOTE — Telephone Encounter (Signed)
 Remote ICM transmission received.  Attempted call to patient regarding ICM remote transmission and left detailed message per DPR.  Left ICM phone number and advised to return call for any fluid symptoms or questions. Next ICM remote transmission scheduled 01/27/2023.

## 2023-01-01 NOTE — Progress Notes (Signed)
EPIC Encounter for ICM Monitoring  Patient Name: [name] [name] is a 62 y.o. male Date: 01/01/2023 Primary Care Physican: [facility] Primary Cardiologist: [name]/Sabharwal Electrophysiologist: Elberta Fortis 11/12/2021 Weight: 231 lbs    04/26/2022 Weight: 241-242 lbs    07/05/2022 Weight: 240 lbs    08/08/2022 Weight: 240 lbs    12/24/2022 Weight: 240 lbs                            Attempted call to patient and unable to reach.  Left detailed message per DPR regarding transmission. Transmission reviewed.    CorVue thoracic impedance suggesting fluid levels returned to normal.    Prescribed:  Furosemide 20 mg take 1 tablet by mouth PRN.  Can take if > 3 lbs weight gain in 24 hours or > 5 lbs in a week or developing of leg swelling Spironolactone 25 mg take 1 tablet daily   Labs: 08/26/2022 Creatinine 0.99, BUN 10, Potassium 4.5, Sodium 140, GFR 86  A complete set of results can be found in Results Review.   Recommendations:  Left voice mail with ICM number and encouraged to call if experiencing any fluid symptoms.   Follow-up plan: ICM clinic phone appointment on 01/27/2023.   91 day device clinic remote transmission 01/28/2023.     EP/Cardiology Office Visits:  01/06/2023 with Francis Dowse, PA.  01/13/2023 with Dr Gasper Lloyd.   02/28/2023 with Dr Elberta Fortis.  Recall 03/19/2023 with Dr Earmon Phoenix APP.   Copy of ICM check sent to Dr. Elberta Fortis.      3 month ICM trend: 12/30/2022.    12-14 Month ICM trend:     Karie Soda, RN 01/01/2023 10:23 AM

## 2023-01-02 NOTE — Progress Notes (Signed)
Cardiology Office Note:  .   Date:  01/02/2023  ID:  [name], DOB 04-09-1960, MRN [id number] PCP: [facility]  [facility] Providers Cardiologist:  Tonny Bollman, MD Cardiology APP:  Beatrice Lecher, PA-C  Electrophysiologist:  Will Jorja Loa, MD {  History of Present Illness: Marland Kitchen   [name] [name] is a 62 y.o. male w/PMHx of CAD (MI 2008 tx w/BMS to LAD), ICM, chronic CHF, VT  Admitted Jan 2024 w/VT > LHC showing single-vessel coronary artery disease with total occlusion of the mid to distal LAD within the previously implanted stents, left to left collaterals present supplying the apical LAD, widely patent left main, left circumflex, ramus intermedius, and RCA, with minimal irregularity but no significant stenose. CAD managed medically.   Sept 2024, admitted with VT (required external defibrillation) started on amiodarone LHC done and unchanged LV apex also noted to be chronically calcified, likely 2/2 old MI  VT was slower then detection rate and required external CV Was hemodynamically tolerated but very symptomatic Discussed plan to re-program VT zones with lower monitoring zone and lower therapy zone with more aggressive ATP to treat further similar arrhythmias   VT Zone 1 150 monitor  VT Zone 2 171 ATP x8 at 85%, then ATP x8 at 82%, then shock x2  VF Unchanged at 214  Saw HF team, 12/18/22, not able to be on SGLT2i with GU infections, pending labs for addition of lasix, suspect trajectory leading to advanced therapies   Today's visit is scheduled for a post hospital visit  ROS:   He is doing well Weight is stable Excellent medication compliance Denies bloating, edema, volume OL No CP, palpitations No symptoms like that of his VT No shocks No syncope or syncope    Device information [employer] single chamber ICD implanted 04/27/21  ++ appropriate therapies  Arrhythmia/AAD hx Amiodarone started Sept 2024  Studies Reviewed: Marland Kitchen     EKG not done today  DEVICE interrogation done today and reviewed by myself Battery and lead measurements are good No VT   LHC 12/02/22   Mid LAD to Dist LAD lesion is 100% stenosed. Right dominant coronary circulation.  Single vessel CAD with CTO of the mid to distal LAD throughout previously stented areas.  Chronic calcification of the LV apex likely secondary to old anterior MI.  LVEDP mildly elevated at 20.  No significant changes from LHC done in 04/01/22.    2D Echo 12/02/22 1. Severely reduced CI by LVOT VTI (0.9L/min/m2). Left ventricular  ejection fraction, by estimation, is 25 to 30%. The left ventricle has  severely decreased function. Left ventricular endocardial border not  optimally defined to evaluate regional wall  motion but the mid-distal anterior walls, the apex, and mid-distal  anterolateral wall appear akinetic. The left ventricular internal cavity  size was severely dilated. There is moderate eccentric left ventricular  hypertrophy. Left ventricular diastolic  parameters are consistent with Grade II diastolic dysfunction  (pseudonormalization).   2. Right ventricular systolic function was not well visualized. The right  ventricular size is not well visualized.   3. Left atrial size was moderately dilated.   4. The mitral valve is grossly normal. Trivial mitral valve  regurgitation. No evidence of mitral stenosis.   5. The aortic valve is tricuspid. Aortic valve regurgitation is not  visualized. No aortic stenosis is present.      Risk Assessment/Calculations:    Physical Exam:   VS:  There were no vitals taken  for this visit.   Wt Readings from Last 3 Encounters:  12/18/22 239 lb (108.4 kg)  12/03/22 241 lb 8 oz (109.5 kg)  08/26/22 246 lb 9.6 oz (111.9 kg)    GEN: Well nourished, well developed in no acute distress NECK: No JVD; No carotid bruits CARDIAC: RRR, no murmurs, rubs, gallops RESPIRATORY:  CTA b/l without rales, wheezing or rhonchi   ABDOMEN: Soft, non-tender, non-distended EXTREMITIES:  No edema; No deformity   ICD site: is stable, no thinning, fluctuation, tethering  ASSESSMENT AND PLAN: .    ICD Intact function No programming changes made   VT on amiodarone No VT since discharge Amio labs today  Discussed no driving 57mo (from 5/78/46), he was aware Keep dr. Elberta Fortis as scheduled given 2 VT hospitalizations this year, will keep another early follow up   ICM Chronic CHF CorVue is below threshold trending back up, though no symptoms or exam findings of volume OL C/w his diuretics as prescribed and PRN dosing as instructed by the HF team Sees Dr. Gasper Lloyd soon   CAD No anginal symptoms Caths as above C/w Dr. Sondra Barges       Dispo: as already scheduled, sooner if needed  Signed, Sheilah Pigeon, PA-C

## 2023-01-06 ENCOUNTER — Ambulatory Visit: Payer: No Typology Code available for payment source | Attending: Physician Assistant | Admitting: Physician Assistant

## 2023-01-06 ENCOUNTER — Encounter: Payer: Self-pay | Admitting: Physician Assistant

## 2023-01-06 VITALS — BP 150/86 | HR 58 | Ht 69.0 in | Wt 238.6 lb

## 2023-01-06 DIAGNOSIS — I2510 Atherosclerotic heart disease of native coronary artery without angina pectoris: Secondary | ICD-10-CM

## 2023-01-06 DIAGNOSIS — I472 Ventricular tachycardia, unspecified: Secondary | ICD-10-CM

## 2023-01-06 DIAGNOSIS — I255 Ischemic cardiomyopathy: Secondary | ICD-10-CM

## 2023-01-06 DIAGNOSIS — Z95810 Presence of automatic (implantable) cardiac defibrillator: Secondary | ICD-10-CM

## 2023-01-06 DIAGNOSIS — I5022 Chronic systolic (congestive) heart failure: Secondary | ICD-10-CM

## 2023-01-06 NOTE — Patient Instructions (Signed)
Medication Instructions:  NO CHANGES *If you need a refill on your cardiac medications before your next appointment, please call your pharmacy*   Lab Work: TODAY LIVER AND TSH  If you have labs (blood work) drawn today and your tests are completely normal, you will receive your results only by: MyChart Message (if you have MyChart) OR A paper copy in the mail If you have any lab test that is abnormal or we need to change your treatment, we will call you to review the results.   Testing/Procedures: NONE   Follow-Up: At [facility], you and your health needs are our priority.  As part of our continuing mission to provide you with exceptional heart care, we have created designated Provider Care Teams.  These Care Teams include your primary Cardiologist (physician) and Advanced Practice Providers (APPs -  Physician Assistants and Nurse Practitioners) who all work together to provide you with the care you need, when you need it.  We recommend signing up for the patient portal called "MyChart".  Sign up information is provided on this After Visit Summary.  MyChart is used to connect with patients for Virtual Visits (Telemedicine).  Patients are able to view lab/test results, encounter notes, upcoming appointments, etc.  Non-urgent messages can be sent to your provider as well.   To learn more about what you can do with MyChart, go to [email or web address].    Your next appointment:   AS SCHEDULED  Provider:       Other Instructions NONE

## 2023-01-07 LAB — HEPATIC FUNCTION PANEL
ALT: 21 [IU]/L (ref 0–44)
AST: 20 [IU]/L (ref 0–40)
Albumin: 4.3 g/dL (ref 3.9–4.9)
Alkaline Phosphatase: 53 [IU]/L (ref 44–121)
Bilirubin Total: <0.2 mg/dL (ref 0.0–1.2)
Bilirubin, Direct: <0.08 mg/dL (ref 0.00–0.40)
Total Protein: 6.8 g/dL (ref 6.0–8.5)

## 2023-01-07 LAB — TSH: TSH: 2.1 u[IU]/mL (ref 0.450–4.500)

## 2023-01-12 NOTE — Progress Notes (Signed)
ADVANCED HEART FAILURE CLINIC NOTE  Referring Physician: [facility]  Primary Care: [facility] Primary Cardiologist: Tonny Bollman, MD HF: Dorthula Nettles, DO  HPI: [name] [name] is a 62 y.o. male with systolic heart failure secondary to ischemic cardiomyopathy (EF 25 to 30%), CAD status post anterior MI in 2008 status post BMS to the LAD, history of VT with ICD in place presenting today to establish care.  Over the past year Mr. Swartley has had multiple admissions.  He was admitted in January 2024 for VT status post ICD shock.  Left heart cath with CTO of the mid to distal LAD filled with left to left collaterals.  He was admitted again in September 2024 with recurrent VT requiring external defibrillation's and was loaded with amiodarone.  Left heart cath at that time was unchanged.  Echocardiogram with LVEF of 25 to 30%.  Since that time he has been seen in [facility] clinic where he reported no further episodes of ventricular tachycardia and reported being able to do most ADLs without significant limitations.  At that time GDMT was further uptitrated and he was referred to advanced heart failure due to concern that he may require advanced therapies.  From an activity standpoint, he reports walking up to 1/8th of a mile daily. No reported short of breath with that level of exertion but he does become fatigued. No chest pain/pressure, lightheadedness.   Activity level/exercise tolerance:  NYHA IIB-III Orthopnea:  Sleeps on 2 pillows Paroxysmal noctural dyspnea:  No Chest pain/pressure:  No Orthostatic lightheadedness:  No Palpitations:  No Lower extremity edema:  No Presyncope/syncope:  No Cough:  No  Past Medical History:  Diagnosis Date   CAD (coronary artery disease)    anterior MI 2008, treated with bare metal stent to LAD   Chronic systolic heart failure (HCC) 06/27/2010   Ischemic CM s/p ICD // Large anterior MI in 2008 tx with BMS to LAD // cMRI  6/10:  Ant, inf, septal, apical AK with full thickness scar, EF 23% // Echo 4/12: Inferoapical, apical, septal and dist ant AK, inferobasal HK, mild LVH, EF 25-30%, mild MR, mild LAE    GERD (gastroesophageal reflux disease)    HLD (hyperlipidemia)    HTN (hypertension)    Ischemic cardiomyopathy    NYHA Class II/III CHF   MI (myocardial infarction) (HCC)    Pericarditis; 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   Umbilical hernia    Ventricular tachycardia (HCC) 07/29/2018   received appropriate ATP therapy for VT at 218 bp with syncope    Current Outpatient Medications  Medication Sig Dispense Refill   amiodarone (PACERONE) 200 MG tablet Take 1 tablet (200 mg total) by mouth daily. 90 tablet 3   ARIPiprazole (ABILIFY) 10 MG tablet Take 10 mg by mouth daily as needed (for ptsd).     aspirin-sod bicarb-citric acid (ALKA-SELTZER) 325 MG TBEF tablet Take 650 mg by mouth every 6 (six) hours as needed (indigestion).     carvedilol (COREG) 25 MG tablet Take 1 tablet (25 mg total) by mouth 2 (two) times daily with a meal. 60 tablet 0   cholecalciferol (VITAMIN D) 1000 UNITS tablet Take 1,000 Units by mouth daily.     ciclopirox (PENLAC) 8 % solution Apply 1 application  topically daily. APPLY DAILY TO FUNGAL TOE NAILS REMOVE WITH NAIL POLISH REMOVER ONCE WEEKLY APPLY DAILY TO FUNGAL TOE NAILS REMOVE WITH NAIL POLISH REMOVER ONCE WEEKLY FUNGAL TOE NAILS  diclofenac Sodium (VOLTAREN) 1 % GEL Apply 2 g topically 4 (four) times daily as needed (shoulder pain).     fish oil-omega-3 fatty acids 1000 MG capsule Take 1 g by mouth daily.      fluticasone (FLONASE) 50 MCG/ACT nasal spray Place 2 sprays into both nostrils as needed for allergies or rhinitis.     furosemide (LASIX) 20 MG tablet Patient can take 20 mg PRN. Can take if > 3 lbs weight gain in 24 hours or > 5 lbs in 1 week or developing of leg swelling. 45 tablet 3   isosorbide-hydrALAZINE (BIDIL) 20-37.5 MG tablet Take 0.5 tablets by mouth 3 (three) times  daily. 45 tablet 3   lidocaine (LIDODERM) 5 % Place 1 patch onto the skin daily as needed (pain). Remove & Discard patch within 12 hours or as directed by MD     loratadine (CLARITIN) 10 MG tablet Take 10 mg by mouth daily as needed for allergies.     Multiple Vitamin (MULTIVITAMIN) tablet Take 1 tablet by mouth daily.       nitroGLYCERIN (NITROSTAT) 0.4 MG SL tablet Place 1 tablet (0.4 mg total) under the tongue every 5 (five) minutes x 3 doses as needed for chest pain. 25 tablet 1   omeprazole (PRILOSEC) 20 MG capsule Take 20 mg by mouth daily as needed (gerd/reflux).     PARoxetine (PAXIL) 40 MG tablet Take 40 mg by mouth daily as needed (for PTSD).     sacubitril-valsartan (ENTRESTO) 97-103 MG Take 1 tablet by mouth 2 (two) times daily. (Patient taking differently: Take 0.5 tablets by mouth 2 (two) times daily.) 180 tablet 2   simvastatin (ZOCOR) 80 MG tablet Take 40 mg by mouth at bedtime.     spironolactone (ALDACTONE) 25 MG tablet Take 1 tablet (25 mg total) by mouth daily. 90 tablet 3   Tetrahydrozoline HCl (VISINE OP) Place 1 drop into both eyes daily as needed (irritation).     UNABLE TO FIND Take 1-2 tablets by mouth See admin instructions. INV-CSP #2002 METFORMIN OR PLACEBO TAB  TAKE 1 TABLET BY MOUTH EVERY DAY WITH EVENING MEAL. WHEN INSTRUCTED, INCREASE DOSE TO 2 TABLETS DAILY WITH EVENING MEAL. DO NOT CRUSH, CHEW OR SPLIT.     vitamin C (ASCORBIC ACID) 500 MG tablet Take 500 mg by mouth daily.     acetaminophen (TYLENOL) 500 MG tablet Take 1,000 mg by mouth every 8 (eight) hours as needed for moderate pain.     aspirin EC 81 MG tablet Take 1 tablet (81 mg total) by mouth daily.     No current facility-administered medications for this encounter.    No Known Allergies    Social History   Socioeconomic History   Marital status: Married    Spouse name: Drinda Butts   Number of children: 2   Years of education: Not on file   Highest education level: High school graduate   Occupational History   Occupation: truck driver   Occupation: Retired  Tobacco Use   Smoking status: Former    Types: Cigars    Quit date: 03/04/2006    Years since quitting: 16.8   Smokeless tobacco: Never  Vaping Use   Vaping status: Never Used  Substance and Sexual Activity   Alcohol use: Yes    Comment: occasionally   Drug use: No   Sexual activity: Not on file  Other Topics Concern   Not on file  Social History Narrative   Lives in [place] with  spouse.  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   Social Determinants of Health   Financial Resource Strain: Low Risk  (12/04/2022)   Overall Financial Resource Strain (CARDIA)    Difficulty of Paying Living Expenses: Not hard at all  Food Insecurity: No Food Insecurity (12/02/2022)   Hunger Vital Sign    Worried About Running Out of Food in the Last Year: Never true    Ran Out of Food in the Last Year: Never true  Transportation Needs: No Transportation Needs (12/02/2022)   PRAPARE - Administrator, Civil Service (Medical): No    Lack of Transportation (Non-Medical): No  Physical Activity: Not on file  Stress: Not on file  Social Connections: Not on file  Intimate Partner Violence: Not At Risk (12/02/2022)   Humiliation, Afraid, Rape, and Kick questionnaire    Fear of Current or Ex-Partner: No    Emotionally Abused: No    Physically Abused: No    Sexually Abused: No      Family History  Problem Relation Age of Onset   Hypertension Mother    Diabetes Mother    Heart Problems Father    Heart failure Unknown    Diabetes Unknown     PHYSICAL EXAM: Vitals:   01/13/23 1113  BP: 138/88  Pulse: 60  SpO2: 95%   GENERAL: Well nourished, well developed, and in no apparent distress at rest.  HEENT: Negative for arcus senilis or xanthelasma. There is no scleral icterus.  The mucous membranes are pink and moist.   NECK: Supple, No masses. Normal  carotid upstrokes without bruits. No masses or thyromegaly.    CHEST: There are no chest wall deformities. There is no chest wall tenderness. Respirations are unlabored.  Lungs- CTA B/L CARDIAC:  JVP: 7 cm H2O         Normal S1, S2  Normal rate with regular rhythm. No murmurs, rubs or gallops.  Pulses are 2+ and symmetrical in upper and lower extremities. No edema.  ABDOMEN: Soft, non-tender, non-distended. There are no masses or hepatomegaly. There are normal bowel sounds.  EXTREMITIES: Warm and well perfused with no cyanosis, clubbing.  LYMPHATIC: No axillary or supraclavicular lymphadenopathy.  NEUROLOGIC: Patient is oriented x3 with no focal or lateralizing neurologic deficits.  PSYCH: Patients affect is appropriate, there is no evidence of anxiety or depression.  SKIN: Warm and dry; no lesions or wounds.   DATA REVIEW  ECG: 01/13/23: sinus bradycardia  As per my personal interpretation  ECHO: 12/02/22: LVEF 25%-30% As per my personal interpretation  CATH: LHC 12/02/22   Mid LAD to Dist LAD lesion is 100% stenosed.   Right dominant coronary circulation.  Single vessel CAD with CTO of the mid to distal LAD throughout previously stented areas.  Chronic calcification of the LV apex likely secondary to old anterior MI.  LVEDP mildly elevated at 20.  No significant changes from LHC done in 04/01/22.    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 NYHA class / AHA Stage:IIB-III; difficult to assess as he is not as active as he could be.  Volume status & Diuretics: Euvolemic Vasodilators: Entresto 97/103 mg twice daily Beta-Blocker: Coreg 25 mg twice daily MRA: Spironolactone 25 mg daily Cardiometabolic: Holding due to frequent GU infections Devices therapies & Valvulopathies: ICD in place  Advanced therapies:May require  advanced therapies in the future. Refer to cardiac rehab today.   2.  Coronary artery disease -Anterior MI in 2008 with PCI to the LAD.  Repeat left heart cath on 1/24 and 9/24 with CTO of the mid to distal LAD followed by left to left collaterals. -Followed by Dr. Excell Seltzer.  Continue aspirin, statin and beta-blocker. -Decrease ASA to 81mg  daily  3.  Ventricular tachycardia -X 2 hospitalizations this year for recurrent VT. -Followed by EP.  Currently on amiodarone 200 mg daily.  4. HTN - 1/2 tab Bidil TID - BMP/BNP today.   I spent 40 minutes caring for this patient today including face to face time, ordering and reviewing labs, reviewing echocardiogram, seeing the patient, documenting in the record, and arranging follow ups.  Hudson Majkowski Advanced Heart Failure Mechanical Circulatory Support

## 2023-01-13 ENCOUNTER — Encounter (HOSPITAL_COMMUNITY): Payer: Self-pay | Admitting: Cardiology

## 2023-01-13 ENCOUNTER — Encounter: Payer: No Typology Code available for payment source | Admitting: Pulmonary Disease

## 2023-01-13 ENCOUNTER — Ambulatory Visit (HOSPITAL_COMMUNITY)
Admission: RE | Admit: 2023-01-13 | Discharge: 2023-01-13 | Disposition: A | Discharge status: Home / Self Care | Payer: Non-veteran care | Source: Ambulatory Visit | Attending: Cardiology | Admitting: Cardiology

## 2023-01-13 VITALS — BP 138/88 | HR 60 | Wt 239.8 lb

## 2023-01-13 DIAGNOSIS — Z95810 Presence of automatic (implantable) cardiac defibrillator: Secondary | ICD-10-CM | POA: Insufficient documentation

## 2023-01-13 DIAGNOSIS — I252 Old myocardial infarction: Secondary | ICD-10-CM | POA: Insufficient documentation

## 2023-01-13 DIAGNOSIS — Z79899 Other long term (current) drug therapy: Secondary | ICD-10-CM | POA: Insufficient documentation

## 2023-01-13 DIAGNOSIS — I2510 Atherosclerotic heart disease of native coronary artery without angina pectoris: Secondary | ICD-10-CM | POA: Diagnosis not present

## 2023-01-13 DIAGNOSIS — Z7982 Long term (current) use of aspirin: Secondary | ICD-10-CM | POA: Diagnosis not present

## 2023-01-13 DIAGNOSIS — I5022 Chronic systolic (congestive) heart failure: Secondary | ICD-10-CM | POA: Insufficient documentation

## 2023-01-13 DIAGNOSIS — I110 Hypertensive heart disease with heart failure: Secondary | ICD-10-CM | POA: Diagnosis present

## 2023-01-13 DIAGNOSIS — I472 Ventricular tachycardia, unspecified: Secondary | ICD-10-CM | POA: Insufficient documentation

## 2023-01-13 DIAGNOSIS — Z955 Presence of coronary angioplasty implant and graft: Secondary | ICD-10-CM | POA: Insufficient documentation

## 2023-01-13 DIAGNOSIS — I255 Ischemic cardiomyopathy: Secondary | ICD-10-CM | POA: Insufficient documentation

## 2023-01-13 DIAGNOSIS — I10 Essential (primary) hypertension: Secondary | ICD-10-CM

## 2023-01-13 LAB — BASIC METABOLIC PANEL
Anion gap: 7 (ref 5–15)
BUN: 13 mg/dL (ref 8–23)
CO2: 26 mmol/L (ref 22–32)
Calcium: 9.2 mg/dL (ref 8.9–10.3)
Chloride: 104 mmol/L (ref 98–111)
Creatinine, Ser: 1.09 mg/dL (ref 0.61–1.24)
GFR, Estimated: >60 mL/min (ref >60)
Glucose, Bld: 113 mg/dL — ABNORMAL HIGH (ref 70–99)
Potassium: 4.2 mmol/L (ref 3.5–5.1)
Sodium: 137 mmol/L (ref 135–145)

## 2023-01-13 LAB — BRAIN NATRIURETIC PEPTIDE: B Natriuretic Peptide: 47.4 pg/mL (ref 0.0–100.0)

## 2023-01-13 MED ORDER — ASPIRIN 81 MG PO TBEC: 81.0000 mg | DELAYED_RELEASE_TABLET | Freq: Every day | ORAL | Status: AC

## 2023-01-13 MED ORDER — ISOSORB DINITRATE-HYDRALAZINE 20-37.5 MG PO TABS: 0.5000 | ORAL_TABLET | Freq: Three times a day (TID) | ORAL | 3 refills | Status: DC

## 2023-01-13 NOTE — Patient Instructions (Signed)
Medication Changes:  START: BIDIL 20-37.5MG  THREE TIMES DAILY   DECREASE ASPIRIN TO 81MG  DAILY   Lab Work:  Labs done today, your results will be available in MyChart, we will contact you for abnormal readings.  Referrals:  YOU HAVE BEEN REFERRED TO CARDIAC REHAB THEY WILL REACH OUT TO YOU OR CALL TO ARRANGE THIS. PLEASE CALL us WITH ANY CONCERNS   Follow-Up in:  2 MONTHS. PLEASE CALL OUR OFFICE AROUND DECEMBER TO GET SCHEDULED FOR YOUR APPOINTMENT. PHONE NUMBER IS [phone] OPTION 2   At the [facility], you and your health needs are our priority. We have a designated team specialized in the treatment of Heart Failure. This Care Team includes your primary Heart Failure Specialized Cardiologist (physician), Advanced Practice Providers (APPs- Physician Assistants and Nurse Practitioners), and Pharmacist who all work together to provide you with the care you need, when you need it.   You may see any of the following providers on your designated Care Team at your next follow up:  Dr. Arvilla Meres Dr. Marca Ancona Dr. Dorthula Nettles Dr. Theresia Bough Tonye Becket, NP Robbie Lis, [place] [facility] [place], [place] Brynda Peon, NP [place] Lee, NP Karle Plumber, PharmD   Please be sure to bring in all your medications bottles to every appointment.   Need to Contact us:  If you have any questions or concerns before your next appointment please send [place] a message through [place] or call our office at [phone].    TO LEAVE A MESSAGE FOR THE NURSE SELECT OPTION 2, PLEASE LEAVE A MESSAGE INCLUDING: YOUR NAME DATE OF BIRTH CALL BACK NUMBER REASON FOR CALL**this is important as we prioritize the call backs  YOU WILL RECEIVE A CALL BACK THE SAME DAY AS LONG AS YOU CALL BEFORE 4:00 PM

## 2023-01-21 ENCOUNTER — Encounter: Payer: Self-pay | Admitting: Cardiology

## 2023-01-27 ENCOUNTER — Ambulatory Visit: Payer: No Typology Code available for payment source | Attending: Cardiology

## 2023-01-27 DIAGNOSIS — I5022 Chronic systolic (congestive) heart failure: Secondary | ICD-10-CM

## 2023-01-27 DIAGNOSIS — Z95810 Presence of automatic (implantable) cardiac defibrillator: Secondary | ICD-10-CM

## 2023-01-28 ENCOUNTER — Ambulatory Visit: Payer: Self-pay

## 2023-01-28 DIAGNOSIS — I5022 Chronic systolic (congestive) heart failure: Secondary | ICD-10-CM

## 2023-01-28 DIAGNOSIS — I255 Ischemic cardiomyopathy: Secondary | ICD-10-CM

## 2023-01-29 NOTE — Progress Notes (Signed)
EPIC Encounter for ICM Monitoring  Patient Name: [name] [name] is a 62 y.o. male Date: 01/29/2023 Primary Care Physican: [facility] Primary Cardiologist: [name]/Sabharwal Electrophysiologist: [name] 11/12/2021 Weight: 231 lbs    04/26/2022 Weight: 241-242 lbs    07/05/2022 Weight: 240 lbs    08/08/2022 Weight: 240 lbs    12/24/2022 Weight: 240 lbs    01/29/2024 Weight: 238 lbs                         Spoke with patient and heart failure questions reviewed.  Transmission results reviewed.  Pt asymptomatic for fluid accumulation.  Reports feeling well at this time and voices no complaints.      CorVue thoracic impedance suggesting normal fluid levels with the exception of possible fluid accumulation from 10/31-11/10 and 11/18-11/21.    Prescribed:  Furosemide 20 mg take 1 tablet by mouth PRN.  Can take if > 3 lbs weight gain in 24 hours or > 5 lbs in a week or developing of leg swelling Spironolactone 25 mg take 1 tablet daily   Labs: 01/13/2023 Creatinine 1.09, BUN 13, Potassium 4.2, Sodium 137, GFR >60  12/18/2022 Creatinine 1.05, BUN 15, Potassium 4.1, Sodium 137, GFR >60  12/02/2022 Creatinine 1.21, BUN 21, Potassium 3.9, Sodium 133, GFR >60  A complete set of results can be found in Results Review.   Recommendations:  No changes and encouraged to call if experiencing any fluid symptoms.   Follow-up plan: ICM clinic phone appointment on 03/03/2023.   91 day device clinic remote transmission 04/29/2023.     EP/Cardiology Office Visits:    Recall 03/14/2023 with Dr Gasper Lloyd.   02/28/2023 with Dr Elberta Fortis.  Recall 03/19/2023 with Dr Earmon Phoenix APP.   Copy of ICM check sent to Dr. Elberta Fortis.   3 month ICM trend: 01/27/2023.    12-14 Month ICM trend:     Karie Soda, RN 01/29/2023 2:48 PM

## 2023-01-31 LAB — CUP PACEART REMOTE DEVICE CHECK
Battery Remaining Longevity: 102 mo
Battery Remaining Percentage: 82 %
Battery Voltage: 3.01 V
Brady Statistic RV Percent Paced: 1 %
Date Time Interrogation Session: 20241128025414
HighPow Impedance: 66 Ohm
Implantable Pulse Generator Implant Date: 20230224
Lead Channel Impedance Value: 430 Ohm
Lead Channel Pacing Threshold Amplitude: 1 V
Lead Channel Pacing Threshold Pulse Width: 0.5 ms
Lead Channel Sensing Intrinsic Amplitude: 11.8 mV
Lead Channel Setting Pacing Amplitude: 2.5 V
Lead Channel Setting Pacing Pulse Width: 0.5 ms
Lead Channel Setting Sensing Sensitivity: 0.5 mV
Pulse Gen Serial Number: 111056600
Zone Setting Status: 755011

## 2023-02-28 ENCOUNTER — Encounter: Payer: Self-pay | Admitting: Cardiology

## 2023-02-28 ENCOUNTER — Ambulatory Visit: Payer: No Typology Code available for payment source | Attending: Cardiology | Admitting: Cardiology

## 2023-02-28 VITALS — BP 120/74 | HR 70 | Ht 69.0 in | Wt 242.4 lb

## 2023-02-28 DIAGNOSIS — I2510 Atherosclerotic heart disease of native coronary artery without angina pectoris: Secondary | ICD-10-CM

## 2023-02-28 DIAGNOSIS — I472 Ventricular tachycardia, unspecified: Secondary | ICD-10-CM

## 2023-02-28 DIAGNOSIS — I255 Ischemic cardiomyopathy: Secondary | ICD-10-CM

## 2023-02-28 DIAGNOSIS — I5022 Chronic systolic (congestive) heart failure: Secondary | ICD-10-CM

## 2023-02-28 LAB — CUP PACEART INCLINIC DEVICE CHECK
Battery Remaining Longevity: 104 mo
Brady Statistic RV Percent Paced: 0.04 %
Date Time Interrogation Session: 20241227101221
HighPow Impedance: 70.875
HighPow Impedance: 71 Ohm
Implantable Lead Connection Status: 753985
Implantable Lead Implant Date: 20120605
Implantable Lead Location: 753860
Implantable Pulse Generator Implant Date: 20230224
Lead Channel Impedance Value: 437.5 Ohm
Lead Channel Pacing Threshold Amplitude: 1 V
Lead Channel Pacing Threshold Amplitude: 1 V
Lead Channel Pacing Threshold Pulse Width: 0.5 ms
Lead Channel Pacing Threshold Pulse Width: 0.5 ms
Lead Channel Sensing Intrinsic Amplitude: 11.8 mV
Lead Channel Setting Pacing Amplitude: 2.5 V
Lead Channel Setting Pacing Pulse Width: 0.5 ms
Lead Channel Setting Sensing Sensitivity: 0.5 mV
Pulse Gen Serial Number: 111056600
Zone Setting Status: 755011

## 2023-02-28 NOTE — Progress Notes (Signed)
  Electrophysiology Office Note:   Date:  02/28/2023  ID:  [name] [name], DOB 05-13-1960, MRN [id number]  Primary Cardiologist: Tonny Bollman, MD Primary Heart Failure: Dorthula Nettles, DO Electrophysiologist: Regan Lemming, MD      History of Present Illness:   [name] [name] is a 62 y.o. male with h/o coronary artery disease with LAD stent, chronic systolic heart failure due to ischemic cardiomyopathy, ventricular tachycardia seen today for routine electrophysiology followup.   Since last being seen in our clinic the patient reports doing overall well.  He has no acute complaints.  He has noted no further issues from arrhythmias.  He is tolerating his amiodarone without issue.  he denies chest pain, palpitations, dyspnea, PND, orthopnea, nausea, vomiting, dizziness, syncope, edema, weight gain, or early satiety.   Review of systems complete and found to be negative unless listed in HPI.      EP Information / Studies Reviewed:    EKG is not ordered today. EKG from 12/18/22 reviewed which showed sinus rhythm, right axis, rate 57      ICD Interrogation-  reviewed in detail today,  See PACEART report.  Device History: Abbott Single Chamber ICD implanted 04/27/21 for CHF History of appropriate therapy: Yes History of AAD therapy: Yes; currently on amiodarone    Risk Assessment/Calculations:             Physical Exam:   VS:  There were no vitals taken for this visit.   Wt Readings from Last 3 Encounters:  01/13/23 239 lb 12.8 oz (108.8 kg)  01/06/23 238 lb 9.6 oz (108.2 kg)  12/18/22 239 lb (108.4 kg)     GEN: Well nourished, well developed in no acute distress NECK: No JVD; No carotid bruits CARDIAC: Regular rate and rhythm, no murmurs, rubs, gallops RESPIRATORY:  Clear to auscultation without rales, wheezing or rhonchi  ABDOMEN: Soft, non-tender, non-distended EXTREMITIES:  No edema; No deformity   ASSESSMENT AND PLAN:    Chronic systolic dysfunction  s/p Abbott single chamber ICD  euvolemic today Stable on an appropriate medical regimen Normal ICD function See Pace Art report No changes today  2.  Ventricular tachycardia: Currently on amiodarone.  No further episodes of VT.  Continue with current management.  3.  Coronary artery disease: No current chest pain.  Plan per primary cardiology.  Disposition:   Follow up with EP APP in 6 months   Signed, Domingo Fuson Jorja Loa, MD

## 2023-02-28 NOTE — Patient Instructions (Signed)
Medication Instructions:  Your physician recommends that you continue on your current medications as directed. Please refer to the Current Medication list given to you today.  *If you need a refill on your cardiac medications before your next appointment, please call your pharmacy*   Lab Work: None ordered   Testing/Procedures: None ordered   Follow-Up: Remote monitoring is used to monitor your Pacemaker of ICD from home. This monitoring reduces the number of office visits required to check your device to one time per year. It allows [place] to keep an eye on the functioning of your device to ensure it is working properly. You are scheduled for a device check from home on 04/28/2022. You may send your transmission at any time that day. If you have a wireless device, the transmission will be sent automatically. After your physician reviews your transmission, you will receive a postcard with your next transmission date.  At [facility], you and your health needs are our priority.  As part of our continuing mission to provide you with exceptional heart care, we have created designated Provider Care Teams.  These Care Teams include your primary Cardiologist (physician) and Advanced Practice Providers (APPs -  Physician Assistants and Nurse Practitioners) who all work together to provide you with the care you need, when you need it.  Your next appointment:   6 month(s)  The format for your next appointment:   In Person  Provider:   You will see one of the following Advanced Practice Providers on your designated Care Team:   Francis Dowse, [place] "Mardelle Matte" [place], [place] Canary Brim, NP    Thank you for choosing [facility]!!   Dory Horn, RN [phone]

## 2023-03-03 ENCOUNTER — Ambulatory Visit: Payer: No Typology Code available for payment source | Attending: Cardiology

## 2023-03-03 DIAGNOSIS — Z95810 Presence of automatic (implantable) cardiac defibrillator: Secondary | ICD-10-CM

## 2023-03-03 DIAGNOSIS — I5022 Chronic systolic (congestive) heart failure: Secondary | ICD-10-CM

## 2023-04-03 ENCOUNTER — Encounter: Payer: Self-pay | Admitting: Cardiovascular Disease

## 2023-04-03 ENCOUNTER — Ambulatory Visit: Payer: Self-pay | Attending: Cardiovascular Disease | Admitting: Cardiovascular Disease

## 2023-04-03 VITALS — BP 116/80 | HR 58 | Ht 70.0 in | Wt 238.4 lb

## 2023-04-03 DIAGNOSIS — I10 Essential (primary) hypertension: Secondary | ICD-10-CM

## 2023-04-03 DIAGNOSIS — I472 Ventricular tachycardia, unspecified: Secondary | ICD-10-CM

## 2023-04-03 DIAGNOSIS — E785 Hyperlipidemia, unspecified: Secondary | ICD-10-CM

## 2023-04-03 DIAGNOSIS — I5020 Unspecified systolic (congestive) heart failure: Secondary | ICD-10-CM

## 2023-04-03 NOTE — Progress Notes (Signed)
Cardiology Office Note:    Date:  04/03/2023   ID:  [name] [name], DOB 1960/11/24, MRN [id number]  PCP:  [facility]   [facility] Providers Cardiologist:  Tonny Bollman, MD Cardiology APP:  Beatrice Lecher, PA-C  Electrophysiologist:  Will Jorja Loa, MD  Advanced Heart Failure:  Dorthula Nettles, DO     Referring MD: [facility]   Chief Complaint  Patient presents with   Coronary Artery Disease    History of Present Illness:    [name] [name] is a 63 y.o. 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  Major depressive disorder Obstructive sleep apnea HFrEF (heart failure with reduced ejection fraction) (HCC)           06/27/2010 Ischemic CM s/p ICD  Large anterior MI in 2008 tx with BMS to LAD  cMRI 6/10:  Ant, inf, septal, apical AK with full thickness scar, EF 23%  Echo 4/12: Inferoapical, apical, septal and dist ant AK, inferobasal HK, mild LVH, EF 25-30%, mild MR, mild LAE  Echocardiogram 8/22: EF 25-30, ant-sept, apical and ant-lat AK, Gr 1 DD, normal RVSF, mild RAE CAD (coronary artery disease)        Anterior STEMI in 2008 s/p 2.75 x 28 mm Vision BMS to LAD Hyperlipidemia       Essential hypertension        GERD           The patient is here alone today.  He has had no recurrent heart palpitations since his episode last fall.  He denies orthopnea, PND, or leg swelling.  He has occasional chest tightness that can be at rest or with exertion.  He has had 2 heart catheterizations last year, both demonstrating stable findings of a chronic occlusion in the LAD with evidence of old anterior infarct based on calcification of the anterior wall, and no other obstructive disease.  He is tolerating amiodarone without side effect.  He has mild exertional dyspnea which is unchanged over time.  He is going to try to get more active and plans on enrolling in cardiac  rehab in the near future.  Current Medications: Current Meds  Medication Sig   acetaminophen (TYLENOL) 500 MG tablet Take 1,000 mg by mouth every 8 (eight) hours as needed for moderate pain.   amiodarone (PACERONE) 200 MG tablet Take 1 tablet (200 mg total) by mouth daily.   ARIPiprazole (ABILIFY) 10 MG tablet Take 10 mg by mouth daily as needed (for ptsd).   aspirin EC 81 MG tablet Take 1 tablet (81 mg total) by mouth daily.   aspirin-sod bicarb-citric acid (ALKA-SELTZER) 325 MG TBEF tablet Take 650 mg by mouth every 6 (six) hours as needed (indigestion).   bisacodyl (DULCOLAX) 5 MG EC tablet Take by mouth.   carvedilol (COREG) 25 MG tablet Take 1 tablet (25 mg total) by mouth 2 (two) times daily with a meal.   cholecalciferol (VITAMIN D) 1000 UNITS tablet Take 1,000 Units by mouth daily.   ciclopirox (PENLAC) 8 % solution Apply 1 application  topically daily. APPLY DAILY TO FUNGAL TOE NAILS REMOVE WITH NAIL POLISH REMOVER ONCE WEEKLY APPLY DAILY TO FUNGAL TOE NAILS REMOVE WITH NAIL POLISH REMOVER ONCE WEEKLY FUNGAL TOE NAILS   diclofenac Sodium (VOLTAREN) 1 % GEL Apply 2 g topically 4 (four) times daily as needed (shoulder pain).   fish oil-omega-3 fatty acids 1000  MG capsule Take 1 g by mouth daily.    fluticasone (FLONASE) 50 MCG/ACT nasal spray Place 2 sprays into both nostrils as needed for allergies or rhinitis.   furosemide (LASIX) 20 MG tablet Patient can take 20 mg PRN. Can take if > 3 lbs weight gain in 24 hours or > 5 lbs in 1 week or developing of leg swelling.   isosorbide-hydrALAZINE (BIDIL) 20-37.5 MG tablet Take 0.5 tablets by mouth 3 (three) times daily.   lidocaine (LIDODERM) 5 % Place 1 patch onto the skin daily as needed (pain). Remove & Discard patch within 12 hours or as directed by MD   loratadine (CLARITIN) 10 MG tablet Take 10 mg by mouth daily as needed for allergies.   Multiple Vitamin (MULTIVITAMIN) tablet Take 1 tablet by mouth daily.     nitroGLYCERIN (NITROSTAT)  0.4 MG SL tablet Place 1 tablet (0.4 mg total) under the tongue every 5 (five) minutes x 3 doses as needed for chest pain.   omeprazole (PRILOSEC) 20 MG capsule Take 20 mg by mouth daily as needed (gerd/reflux).   PARoxetine (PAXIL) 40 MG tablet Take 40 mg by mouth daily as needed (for PTSD).   sacubitril-valsartan (ENTRESTO) 97-103 MG Take 1 tablet by mouth 2 (two) times daily. (Patient taking differently: Take 0.5 tablets by mouth 2 (two) times daily.)   simvastatin (ZOCOR) 80 MG tablet Take 40 mg by mouth at bedtime.   spironolactone (ALDACTONE) 25 MG tablet Take 1 tablet (25 mg total) by mouth daily.   Tetrahydrozoline HCl (VISINE OP) Place 1 drop into both eyes daily as needed (irritation).   UNABLE TO FIND Take 1-2 tablets by mouth See admin instructions. INV-CSP #2002 METFORMIN OR PLACEBO TAB  TAKE 1 TABLET BY MOUTH EVERY DAY WITH EVENING MEAL. WHEN INSTRUCTED, INCREASE DOSE TO 2 TABLETS DAILY WITH EVENING MEAL. DO NOT CRUSH, CHEW OR SPLIT.   vitamin C (ASCORBIC ACID) 500 MG tablet Take 500 mg by mouth daily.     Allergies:   Patient has no known allergies.   ROS:   Please see the history of present illness.    All other systems reviewed and are negative.  EKGs/Labs/Other Studies Reviewed:    The following studies were reviewed today: Cardiac Studies & Procedures   CARDIAC CATHETERIZATION  CARDIAC CATHETERIZATION 12/02/2022  Narrative   Mid LAD to Dist LAD lesion is 100% stenosed.  Right dominant coronary circulation. Single vessel CAD with CTO of the mid to distal LAD throughout previously stented areas. Chronic calcification of the LV apex likely secondary to old anterior MI. LVEDP mildly elevated at 20. No significant changes from LHC done in 04/01/22.  Aditya Sabharwal 4:54 PM  Findings Coronary Findings Diagnostic  Dominance: Right  Left Main The left main is patent with minimal irregularity.  There is no significant stenosis present.  The left main trifurcates  into the LAD, left circumflex, and a large intermediate branch.  Left Anterior Descending The proximal LAD is patent with minimal irregularity.  The vessel is occluded throughout the stented segment in the mid to distal region.  The apical portion of the LAD is collateralized via septal perforators and the ramus intermedius branch (left to left collaterals). Collaterals Dist LAD filled by collaterals from Lat Ramus.  Mid LAD to Dist LAD lesion is 100% stenosed. The lesion was previously treated .  Ramus Intermedius Vessel is large. There is mild diffuse disease throughout the vessel. The intermediate branch is a large-caliber vessel.  It divides into  2 main branches in its midportion.  There is mild plaquing at the bifurcation without any significant stenosis.  Lateral Ramus Intermedius There is mild disease in the vessel.  Left Circumflex The vessel exhibits minimal luminal irregularities. The circumflex is patent with no significant stenosis.  There are minimal irregularities.  The vessel supplies a small posterolateral branch.  Right Coronary Artery The vessel exhibits minimal luminal irregularities. The RCA has a high origin.  There is no significant stenosis throughout the course of the RCA distribution.  The PDA and PLA branches are patent with no stenoses.  Intervention  No interventions have been documented.   CARDIAC CATHETERIZATION  CARDIAC CATHETERIZATION 04/01/2022  Narrative 1.  Single-vessel coronary artery disease with total occlusion of the mid to distal LAD within the previously implanted stents, left to left collaterals present supplying the apical LAD 2.  Widely patent left main, left circumflex, ramus intermedius, and RCA, with minimal irregularity but no significant stenoses 3.  Normal LVEDP, dense calcification of the LV apex noted, consistent with old infarction  Plan: Continued medical therapy  Findings Coronary Findings Diagnostic  Dominance:  Right  Left Main The left main is patent with minimal irregularity.  There is no significant stenosis present.  The left main trifurcates into the LAD, left circumflex, and a large intermediate branch.  Left Anterior Descending The proximal LAD is patent with minimal irregularity.  The vessel is occluded throughout the stented segment in the mid to distal region.  The apical portion of the LAD is collateralized via septal perforators and the ramus intermedius branch (left to left collaterals). Collaterals Dist LAD filled by collaterals from Lat Ramus.  Mid LAD to Dist LAD lesion is 100% stenosed. The lesion was previously treated .  Ramus Intermedius Vessel is large. There is mild diffuse disease throughout the vessel. The intermediate branch is a large-caliber vessel.  It divides into 2 main branches in its midportion.  There is mild plaquing at the bifurcation without any significant stenosis.  Lateral Ramus Intermedius There is mild disease in the vessel.  Left Circumflex The vessel exhibits minimal luminal irregularities. The circumflex is patent with no significant stenosis.  There are minimal irregularities.  The vessel supplies a small posterolateral branch.  Right Coronary Artery The vessel exhibits minimal luminal irregularities. The RCA has a high origin.  There is no significant stenosis throughout the course of the RCA distribution.  The PDA and PLA branches are patent with no stenoses.  Intervention  No interventions have been documented.    ECHOCARDIOGRAM  ECHOCARDIOGRAM COMPLETE 12/02/2022  Narrative ECHOCARDIOGRAM REPORT    Patient Name:   [name] [name] Date of Exam: 12/02/2022 Medical Rec #:  [id number]          Height:       69.0 in Accession #:    [id number]         Weight:       247.0 lb Date of Birth:  03/06/1960          BSA:          2.260 m Patient Age:    61 years           BP:           107/67 mmHg Patient Gender: M                  HR:            56 bpm. Exam Location:  Inpatient  Procedure:  2D Echo, Cardiac Doppler, Color Doppler and Intracardiac Opacification Agent  Indications:    cardiomyopathy  History:        Patient has prior history of Echocardiogram examinations, most recent 10/31/2020. CHF and Cardiomyopathy; Defibrillator.  Sonographer:    Melissa Morford RDCS (AE, PE) Referring Phys: [id number] [facility] TANNU   Sonographer Comments: Technically difficult study due to poor echo windows. IMPRESSIONS   1. Severely reduced CI by LVOT VTI (0.9L/min/m2). Left ventricular ejection fraction, by estimation, is 25 to 30%. The left ventricle has severely decreased function. Left ventricular endocardial border not optimally defined to evaluate regional wall motion but the mid-distal anterior walls, the apex, and mid-distal anterolateral wall appear akinetic. The left ventricular internal cavity size was severely dilated. There is moderate eccentric left ventricular hypertrophy. Left ventricular diastolic parameters are consistent with Grade II diastolic dysfunction (pseudonormalization). 2. Right ventricular systolic function was not well visualized. The right ventricular size is not well visualized. 3. Left atrial size was moderately dilated. 4. The mitral valve is grossly normal. Trivial mitral valve regurgitation. No evidence of mitral stenosis. 5. The aortic valve is tricuspid. Aortic valve regurgitation is not visualized. No aortic stenosis is present.  FINDINGS Left Ventricle: Severely reduced CI by LVOT VTI (0.9L/min/m2). Left ventricular ejection fraction, by estimation, is 25 to 30%. The left ventricle has severely decreased function. Left ventricular endocardial border not optimally defined to evaluate regional wall motion. Definity contrast agent was given IV to delineate the left ventricular endocardial borders. The left ventricular internal cavity size was severely dilated. There is moderate eccentric left ventricular  hypertrophy. Left ventricular diastolic parameters are consistent with Grade II diastolic dysfunction (pseudonormalization).  Right Ventricle: The right ventricular size is not well visualized. Right vetricular wall thickness was not well visualized. Right ventricular systolic function was not well visualized.  Left Atrium: Left atrial size was moderately dilated.  Right Atrium: Right atrial size was not well visualized.  Pericardium: There is no evidence of pericardial effusion.  Mitral Valve: The mitral valve is grossly normal. Trivial mitral valve regurgitation. No evidence of mitral valve stenosis.  Tricuspid Valve: The tricuspid valve is grossly normal. Tricuspid valve regurgitation is trivial.  Aortic Valve: The aortic valve is tricuspid. Aortic valve regurgitation is not visualized. No aortic stenosis is present.  Pulmonic Valve: The pulmonic valve was not well visualized. Pulmonic valve regurgitation is trivial.  Aorta: The aortic root and ascending aorta are structurally normal, with no evidence of dilitation.  IAS/Shunts: The interatrial septum was not well visualized.  Additional Comments: A device lead is visualized.   LEFT VENTRICLE PLAX 2D LVIDd:         6.40 cm   Diastology LVIDs:         5.10 cm   LV e' medial:    5.55 cm/s LV PW:         0.90 cm   LV E/e' medial:  13.4 LV IVS:        0.90 cm   LV e' lateral:   5.00 cm/s LVOT diam:     2.00 cm   LV E/e' lateral: 14.8 LV SV:         38 LV SV Index:   17 LVOT Area:     3.14 cm   LEFT ATRIUM              Index LA diam:        3.50 cm  1.55 cm/m LA Vol (A2C):   77.8 ml  34.43 ml/m LA Vol (A4C):   107.0 ml 47.35 ml/m LA Biplane Vol: 90.6 ml  40.09 ml/m AORTIC VALVE LVOT Vmax:   50.90 cm/s LVOT Vmean:  37.400 cm/s LVOT VTI:    0.122 m  AORTA Ao Root diam: 2.90 cm  MITRAL VALVE MV Area (PHT): 5.02 cm    SHUNTS MV Decel Time: 151 msec    Systemic VTI:  0.12 m MV E velocity: 74.10 cm/s  Systemic  Diam: 2.00 cm MV A velocity: 55.70 cm/s MV E/A ratio:  1.33  Clearnce Hasten Electronically signed by Clearnce Hasten Signature Date/Time: 12/02/2022/1:47:38 PM    Final     CARDIAC MRI  MR CARDIAC MORPHOLOGY W WO CONTRAST 08/08/2008  Narrative Cardiac MRI:  Indication:  Quantitate EF  Protocol:  The patient was scanned on a 1.5 Tesla GE magnet.  A dedicated cardiac coil was used.  Functional imaging was done using Fiesta sequences.  2,3, and 4 chamber views were done to assess RWMA's.  Quantification of EF was done using mass analysis software.  The patient received 42cc of Magnevist.  After 10 minutes inversion recovery sequences were done to assess hyperenhancement.  Findings:  There was moderate to severe LV cavity enlargement.  The mid, and distal anterior and inferior walls as well as the septum and entire apex were thin and akinetic.  Basal function was preserved.  The quantitative EF was 23% ( EDV 215cc ESV 166cc SV 49cc)  There was full thickness scar involve the mid and distal anterior and inferior walls, septum and apex.  There was no mural apical thrombus.  There was mild LAE.  Right sided cardiac chambers were normal .  There was no pericardial effusion.  Impression:  1)    Moderate to severe LV enlargement with mid and distal anterior and inferior, septal and apical akinesis.  Findings would be consistant with wrap around LAD infarct  2)    Full thickness scar involving large area of myocardium described by [facility] above 3)    Quantitative EF 23% 4)    Normal right sided cardiac chambers 5)    No pericardial effusion.  Provider: Council Mechanic         EKG:   EKG Interpretation Date/Time:  Thursday April 03 2023 13:50:10 EST Ventricular Rate:  58 PR Interval:  170 QRS Duration:  96 QT Interval:  284 QTC Calculation: 278 R Axis:   72  Text Interpretation: Sinus bradycardia Anterior infarct (cited on or before 03-Apr-2023) When compared with ECG of  18-Dec-2022 09:09, No significant change was found Confirmed by Tonny Bollman [phone]) on 04/03/2023 3:14:01 PM    Recent Labs: 12/02/2022: Hemoglobin 12.9; Magnesium 2.0; Platelets 204 01/06/2023: ALT 21; TSH 2.100 01/13/2023: B Natriuretic Peptide 47.4; BUN 13; Creatinine, Ser 1.09; Potassium 4.2; Sodium 137  Recent Lipid Panel    Component Value Date/Time   CHOL 121 05/21/2010 1139   TRIG 95.0 05/21/2010 1139   HDL 34.60 (L) 05/21/2010 1139   CHOLHDL 3 05/21/2010 1139   VLDL 19.0 05/21/2010 1139   LDLCALC 67 05/21/2010 1139     Risk Assessment/Calculations:                Physical Exam:    VS:  BP 116/80   Pulse (!) 58   Ht 5\' 10"  (1.778 m)   Wt 238 lb 6.4 oz (108.1 kg)   SpO2 95%   BMI 34.21 kg/m     Wt Readings from Last 3 Encounters:  04/03/23 238  lb 6.4 oz (108.1 kg)  02/28/23 242 lb 6.4 oz (110 kg)  01/13/23 239 lb 12.8 oz (108.8 kg)     GEN:  Well nourished, well developed in no acute distress HEENT: Normal NECK: No JVD; No carotid bruits LYMPHATICS: No lymphadenopathy CARDIAC: RRR, no murmurs, rubs, gallops RESPIRATORY:  Clear to auscultation without rales, wheezing or rhonchi  ABDOMEN: Soft, non-tender, non-distended MUSCULOSKELETAL:  No edema; No deformity  SKIN: Warm and dry NEUROLOGIC:  Alert and oriented x 3 PSYCHIATRIC:  Normal affect   Assessment & Plan Essential hypertension Blood pressure controlled on Entresto, spironolactone, and carvedilol. VT (ventricular tachycardia) (HCC) No recurrence on amiodarone.  Followed by Dr. Elberta Fortis.  Discussed need for amiodarone monitoring with every 63-month LFTs and thyroid studies.  I discussed with him the need for an annual eye exam.  He just saw the ophthalmologist yesterday.  Seems to be doing okay. HFrEF (heart failure with reduced ejection fraction) (HCC) Stable on GDMT as outlined above.  LVEF is 25 to 30%.  He has seen Dr. Gasper Lloyd in  the heart failure clinic. Hyperlipidemia LDL goal  <70 Treated with simvastatin.  Lipids followed by PCP.  Goal LDL less than 70.     Medication Adjustments/Labs and Tests Ordered: Current medicines are reviewed at length with the patient today.  Concerns regarding medicines are outlined above.  Orders Placed This Encounter  Procedures   EKG 12-Lead   No orders of the defined types were placed in this encounter.   Patient Instructions  Follow-Up: At [facility], you and your health needs are our priority.  As part of our continuing mission to provide you with exceptional heart care, we have created designated Provider Care Teams.  These Care Teams include your primary Cardiologist (physician) and Advanced Practice Providers (APPs -  Physician Assistants and Nurse Practitioners) who all work together to provide you with the care you need, when you need it.  Your next appointment:   1 year(s)  Provider:   Tonny Bollman, MD        1st Floor: - Lobby - Registration  - Pharmacy  - Lab - Cafe  2nd Floor: - PV Lab - Diagnostic Testing (echo, CT, nuclear med)  3rd Floor: - Vacant  4th Floor: - TCTS (cardiothoracic surgery) - AFib Clinic - Structural Heart Clinic - Vascular Surgery  - Vascular Ultrasound  5th Floor: - HeartCare Cardiology (general and EP) - Clinical Pharmacy for coumadin, hypertension, lipid, weight-loss medications, and med management appointments    Valet parking services will be available as well.     Signed, Tonny Bollman, MD  04/03/2023 3:14 PM    [facility]

## 2023-04-03 NOTE — Assessment & Plan Note (Signed)
Blood pressure controlled on Entresto, spironolactone, and carvedilol.

## 2023-04-03 NOTE — Assessment & Plan Note (Signed)
Stable on GDMT as outlined above.  LVEF is 25 to 30%.  He has seen Dr. Gasper Lloyd in  the heart failure clinic.

## 2023-04-03 NOTE — Assessment & Plan Note (Signed)
Treated with simvastatin.  Lipids followed by PCP.  Goal LDL less than 70.

## 2023-04-03 NOTE — Patient Instructions (Signed)
Follow-Up: At [facility], you and your health needs are our priority.  As part of our continuing mission to provide you with exceptional heart care, we have created designated Provider Care Teams.  These Care Teams include your primary Cardiologist (physician) and Advanced Practice Providers (APPs -  Physician Assistants and Nurse Practitioners) who all work together to provide you with the care you need, when you need it.  Your next appointment:   1 year(s)  Provider:   Tonny Bollman, MD        1st Floor: - Lobby - Registration  - Pharmacy  - Lab - Cafe  2nd Floor: - PV Lab - Diagnostic Testing (echo, CT, nuclear med)  3rd Floor: - Vacant  4th Floor: - TCTS (cardiothoracic surgery) - AFib Clinic - Structural Heart Clinic - Vascular Surgery  - Vascular Ultrasound  5th Floor: - HeartCare Cardiology (general and EP) - Clinical Pharmacy for coumadin, hypertension, lipid, weight-loss medications, and med management appointments    Valet parking services will be available as well.

## 2023-04-07 ENCOUNTER — Ambulatory Visit: Payer: No Typology Code available for payment source | Attending: Cardiology

## 2023-04-07 DIAGNOSIS — I5022 Chronic systolic (congestive) heart failure: Secondary | ICD-10-CM

## 2023-04-07 DIAGNOSIS — Z95810 Presence of automatic (implantable) cardiac defibrillator: Secondary | ICD-10-CM

## 2023-04-29 ENCOUNTER — Ambulatory Visit (INDEPENDENT_AMBULATORY_CARE_PROVIDER_SITE_OTHER): Payer: Self-pay

## 2023-04-29 DIAGNOSIS — I255 Ischemic cardiomyopathy: Secondary | ICD-10-CM

## 2023-04-30 LAB — CUP PACEART REMOTE DEVICE CHECK
Battery Remaining Longevity: 100 mo
Battery Remaining Percentage: 80 %
Battery Voltage: 3.01 V
Brady Statistic RV Percent Paced: 1 %
Date Time Interrogation Session: 20250225201811
HighPow Impedance: 62 Ohm
Implantable Lead Connection Status: 753985
Implantable Lead Implant Date: 20120605
Implantable Lead Location: 753860
Implantable Pulse Generator Implant Date: 20230224
Lead Channel Impedance Value: 460 Ohm
Lead Channel Pacing Threshold Amplitude: 1 V
Lead Channel Pacing Threshold Pulse Width: 0.5 ms
Lead Channel Sensing Intrinsic Amplitude: 11.8 mV
Lead Channel Setting Pacing Amplitude: 2.5 V
Lead Channel Setting Pacing Pulse Width: 0.5 ms
Lead Channel Setting Sensing Sensitivity: 0.5 mV
Pulse Gen Serial Number: 111056600
Zone Setting Status: 755011

## 2023-05-12 ENCOUNTER — Ambulatory Visit: Payer: No Typology Code available for payment source | Attending: Cardiology

## 2023-05-12 DIAGNOSIS — I5022 Chronic systolic (congestive) heart failure: Secondary | ICD-10-CM

## 2023-05-12 DIAGNOSIS — Z95810 Presence of automatic (implantable) cardiac defibrillator: Secondary | ICD-10-CM

## 2023-05-13 NOTE — Progress Notes (Signed)
 EPIC Encounter for ICM Monitoring  Patient Name: [name] [name] is a 63 y.o. male Date: 05/13/2023 Primary Care Physican: [facility] Primary Cardiologist: [name]/Sabharwal Electrophysiologist: [name] 12/24/2022 Weight: 240 lbs    01/29/2024 Weight: 238 lbs  03/07/2023 Weight: 238 lbs   04/10/2023 Weight: 238 lbs  05/13/2023 Weight: 233 lbs                     Spoke with patient and heart failure questions reviewed.  Transmission results reviewed.  Pt asymptomatic for fluid accumulation.  Reports feeling well at this time and voices no complaints.      CorVue thoracic impedance suggesting possible dryness starting 3/6 (in response to taking a couple of days of PRN Lasix) but starting to trend back toward baseline.    Prescribed:  Furosemide 20 mg take 1 tablet by mouth PRN.  Can take if > 3 lbs weight gain in 24 hours or > 5 lbs in a week or developing of leg swelling Spironolactone 25 mg take 1 tablet daily   Labs: 01/13/2023 Creatinine 1.09, BUN 13, Potassium 4.2, Sodium 137, GFR >60  12/18/2022 Creatinine 1.05, BUN 15, Potassium 4.1, Sodium 137, GFR >60  12/02/2022 Creatinine 1.21, BUN 21, Potassium 3.9, Sodium 133, GFR >60  A complete set of results can be found in Results Review.   Recommendations:  Encouraged to drink a little extra fluid today.  No changes and encouraged to call if experiencing any fluid symptoms.   Follow-up plan: ICM clinic phone appointment on 06/16/2023.   91 day device clinic remote transmission 07/29/2023.     EP/Cardiology Office Visits:  05/21/2023 with Dr Gasper Lloyd.   Recall 08/27/2023 with Francis Dowse, PA.  Recall 03/19/2023 with Dr Earmon Phoenix APP.   Copy of ICM check sent to Dr. Elberta Fortis.   3 month ICM trend: 05/12/2023.    12-14 Month ICM trend:     Karie Soda, RN 05/13/2023 1:47 PM

## 2023-05-20 NOTE — Progress Notes (Signed)
 ADVANCED HEART FAILURE CLINIC NOTE  Referring Physician: [facility]  Primary Care: [facility] Primary Cardiologist: Tonny Bollman, MD HF: Dorthula Nettles, DO  HPI: EVER GUSTAFSON is a 63 y.o. male with systolic heart failure secondary to ischemic cardiomyopathy (EF 25 to 30%), CAD status post anterior MI in 2008 status post BMS to the LAD, history of VT with ICD in place presenting today to establish care.  Over the past year Mr. Kohlbeck has had multiple admissions.  He was admitted in January 2024 for VT status post ICD shock.  Left heart cath with CTO of the mid to distal LAD filled with left to left collaterals.  He was admitted again in September 2024 with recurrent VT requiring external defibrillation's and was loaded with amiodarone.  Left heart cath at that time was unchanged.  Echocardiogram with LVEF of 25 to 30%.  Since that time he has been seen in [facility] clinic where he reported no further episodes of ventricular tachycardia and reported being able to do most ADLs without significant limitations.  At that time GDMT was further uptitrated and he was referred to advanced heart failure due to concern that he may require advanced therapies.  From an activity standpoint, he reports walking up to 1/8th of a mile daily. No reported short of breath with that level of exertion but he does become fatigued. No chest pain/pressure, lightheadedness.   Activity level/exercise tolerance:  NYHA IIB-III Orthopnea:  Sleeps on 2 pillows Paroxysmal noctural dyspnea:  No Chest pain/pressure:  No Orthostatic lightheadedness:  No Palpitations:  No Lower extremity edema:  No Presyncope/syncope:  No Cough:  No  Current Outpatient Medications  Medication Sig Dispense Refill   acetaminophen (TYLENOL) 500 MG tablet Take 1,000 mg by mouth every 8 (eight) hours as needed for moderate pain.     amiodarone (PACERONE) 200 MG tablet Take 1 tablet (200 mg total) by mouth  daily. 90 tablet 3   ARIPiprazole (ABILIFY) 10 MG tablet Take 10 mg by mouth daily as needed (for ptsd).     aspirin EC 81 MG tablet Take 1 tablet (81 mg total) by mouth daily.     aspirin-sod bicarb-citric acid (ALKA-SELTZER) 325 MG TBEF tablet Take 650 mg by mouth every 6 (six) hours as needed (indigestion).     bisacodyl (DULCOLAX) 5 MG EC tablet Take by mouth.     carvedilol (COREG) 25 MG tablet Take 1 tablet (25 mg total) by mouth 2 (two) times daily with a meal. 60 tablet 0   cholecalciferol (VITAMIN D) 1000 UNITS tablet Take 1,000 Units by mouth daily.     ciclopirox (PENLAC) 8 % solution Apply 1 application  topically daily. APPLY DAILY TO FUNGAL TOE NAILS REMOVE WITH NAIL POLISH REMOVER ONCE WEEKLY APPLY DAILY TO FUNGAL TOE NAILS REMOVE WITH NAIL POLISH REMOVER ONCE WEEKLY FUNGAL TOE NAILS     diclofenac Sodium (VOLTAREN) 1 % GEL Apply 2 g topically 4 (four) times daily as needed (shoulder pain).     fish oil-omega-3 fatty acids 1000 MG capsule Take 1 g by mouth daily.      fluticasone (FLONASE) 50 MCG/ACT nasal spray Place 2 sprays into both nostrils as needed for allergies or rhinitis.     furosemide (LASIX) 20 MG tablet Patient can take 20 mg PRN. Can take if > 3 lbs weight gain in 24 hours or > 5 lbs in 1 week or developing of leg swelling. 45 tablet 3   isosorbide-hydrALAZINE (  BIDIL) 20-37.5 MG tablet Take 0.5 tablets by mouth 3 (three) times daily. 45 tablet 3   lidocaine (LIDODERM) 5 % Place 1 patch onto the skin daily as needed (pain). Remove & Discard patch within 12 hours or as directed by MD     loratadine (CLARITIN) 10 MG tablet Take 10 mg by mouth daily as needed for allergies.     Multiple Vitamin (MULTIVITAMIN) tablet Take 1 tablet by mouth daily.       nitroGLYCERIN (NITROSTAT) 0.4 MG SL tablet Place 1 tablet (0.4 mg total) under the tongue every 5 (five) minutes x 3 doses as needed for chest pain. 25 tablet 1   omeprazole (PRILOSEC) 20 MG capsule Take 20 mg by mouth daily  as needed (gerd/reflux).     PARoxetine (PAXIL) 40 MG tablet Take 40 mg by mouth daily as needed (for PTSD).     sacubitril-valsartan (ENTRESTO) 97-103 MG Take 1 tablet by mouth 2 (two) times daily. (Patient taking differently: Take 0.5 tablets by mouth 2 (two) times daily.) 180 tablet 2   simvastatin (ZOCOR) 80 MG tablet Take 40 mg by mouth at bedtime.     spironolactone (ALDACTONE) 25 MG tablet Take 1 tablet (25 mg total) by mouth daily. 90 tablet 3   Tetrahydrozoline HCl (VISINE OP) Place 1 drop into both eyes daily as needed (irritation).     UNABLE TO FIND Take 1-2 tablets by mouth See admin instructions. INV-CSP #2002 METFORMIN OR PLACEBO TAB  TAKE 1 TABLET BY MOUTH EVERY DAY WITH EVENING MEAL. WHEN INSTRUCTED, INCREASE DOSE TO 2 TABLETS DAILY WITH EVENING MEAL. DO NOT CRUSH, CHEW OR SPLIT.     vitamin C (ASCORBIC ACID) 500 MG tablet Take 500 mg by mouth daily.     No current facility-administered medications for this visit.    No Known Allergies  PHYSICAL EXAM: There were no vitals filed for this visit. GENERAL: NAD Lungs- *** CARDIAC:  JVP: *** cm          Normal rate with regular rhythm. *** murmur.  Pulses ***. *** edema.  ABDOMEN: Soft, non-tender, non-distended.  EXTREMITIES: Warm and well perfused.  NEUROLOGIC: No obvious FND   DATA REVIEW  ECG: 01/13/23: sinus bradycardia  As per my personal interpretation  ECHO: 12/02/22: LVEF 25%-30% As per my personal interpretation  CATH: LHC 12/02/22   Mid LAD to Dist LAD lesion is 100% stenosed.   Right dominant coronary circulation.  Single vessel CAD with CTO of the mid to distal LAD throughout previously stented areas.  Chronic calcification of the LV apex likely secondary to old anterior MI.  LVEDP mildly elevated at 20.  No significant changes from LHC done in 04/01/22.    ASSESSMENT & PLAN:  Heart failure with reduced ejection fraction Etiology of HF: Ischemic cardiomyopathy with PCI to the LAD that is now CTO;  has likely led to mixed nonischemic/ischemic myopathic process. NYHA class / AHA Stage:IIB-III; difficult to assess as he is not as active as he could be.  Volume status & Diuretics: Euvolemic Vasodilators: Entresto 97/103 mg twice daily Beta-Blocker: Coreg 25 mg twice daily MRA: Spironolactone 25 mg daily Cardiometabolic: Holding due to frequent GU infections Devices therapies & Valvulopathies: ICD in place Advanced therapies:May require advanced therapies in the future. Refer to cardiac rehab today.   2.  Coronary artery disease -Anterior MI in 2008 with PCI to the LAD.  Repeat left heart cath on 1/24 and 9/24 with CTO of the mid to distal LAD followed  by left to left collaterals. -Followed by Dr. Excell Seltzer.  Continue aspirin, statin and beta-blocker. -Decrease ASA to 81mg  daily  3.  Ventricular tachycardia -X 2 hospitalizations this year for recurrent VT. -Followed by EP.  Currently on amiodarone 200 mg daily.  4. HTN - 1/2 tab Bidil TID - BMP/BNP today.   I spent 40 minutes caring for this patient today including face to face time, ordering and reviewing labs, reviewing echocardiogram, seeing the patient, documenting in the record, and arranging follow ups.  Karsten Howry Advanced Heart Failure Mechanical Circulatory Support

## 2023-05-21 ENCOUNTER — Ambulatory Visit (HOSPITAL_COMMUNITY)
Admission: RE | Admit: 2023-05-21 | Discharge: 2023-05-21 | Disposition: A | Discharge status: Home / Self Care | Payer: Self-pay | Source: Ambulatory Visit | Attending: Cardiology | Admitting: Cardiology

## 2023-05-21 ENCOUNTER — Encounter (HOSPITAL_COMMUNITY): Payer: Self-pay | Admitting: Cardiology

## 2023-05-21 VITALS — BP 108/68 | HR 58 | Ht 70.0 in | Wt 236.0 lb

## 2023-05-21 DIAGNOSIS — Z95810 Presence of automatic (implantable) cardiac defibrillator: Secondary | ICD-10-CM

## 2023-05-21 DIAGNOSIS — I2510 Atherosclerotic heart disease of native coronary artery without angina pectoris: Secondary | ICD-10-CM

## 2023-05-21 DIAGNOSIS — I252 Old myocardial infarction: Secondary | ICD-10-CM | POA: Insufficient documentation

## 2023-05-21 DIAGNOSIS — I472 Ventricular tachycardia, unspecified: Secondary | ICD-10-CM

## 2023-05-21 DIAGNOSIS — I110 Hypertensive heart disease with heart failure: Secondary | ICD-10-CM | POA: Insufficient documentation

## 2023-05-21 DIAGNOSIS — I255 Ischemic cardiomyopathy: Secondary | ICD-10-CM | POA: Insufficient documentation

## 2023-05-21 DIAGNOSIS — I5022 Chronic systolic (congestive) heart failure: Secondary | ICD-10-CM

## 2023-05-21 LAB — BASIC METABOLIC PANEL
Anion gap: 11 (ref 5–15)
BUN: 16 mg/dL (ref 8–23)
CO2: 26 mmol/L (ref 22–32)
Calcium: 9.3 mg/dL (ref 8.9–10.3)
Chloride: 99 mmol/L (ref 98–111)
Creatinine, Ser: 1.18 mg/dL (ref 0.61–1.24)
GFR, Estimated: >60 mL/min (ref >60)
Glucose, Bld: 105 mg/dL — ABNORMAL HIGH (ref 70–99)
Potassium: 4.1 mmol/L (ref 3.5–5.1)
Sodium: 136 mmol/L (ref 135–145)

## 2023-05-21 LAB — BRAIN NATRIURETIC PEPTIDE: B Natriuretic Peptide: 51 pg/mL (ref 0.0–100.0)

## 2023-05-21 NOTE — Patient Instructions (Signed)
 Medication Changes:  No Changes In Medications at this time.   Lab Work:  Labs done today, your results will be available in MyChart, we will contact you for abnormal readings.  Testing/Procedures:  ECHOCARDIOGRAM AS SCHEDULED   Follow-Up in: 3 MONTHS AS SCHEDULED WITH DR. Gasper Lloyd   At the Advanced Heart Failure Clinic, you and your health needs are our priority. We have a designated team specialized in the treatment of Heart Failure. This Care Team includes your primary Heart Failure Specialized Cardiologist (physician), Advanced Practice Providers (APPs- Physician Assistants and Nurse Practitioners), and Pharmacist who all work together to provide you with the care you need, when you need it.   You may see any of the following providers on your designated Care Team at your next follow up:  Dr. Arvilla Meres Dr. Marca Ancona Dr. Dorthula Nettles Dr. Theresia Bough Tonye Becket, NP Robbie Lis, [place] [facility] [place], [place] Brynda Peon, NP [place] Lee, NP Karle Plumber, PharmD   Please be sure to bring in all your medications bottles to every appointment.   Need to Contact us:  If you have any questions or concerns before your next appointment please send [place] a message through [place] or call our office at [phone].    TO LEAVE A MESSAGE FOR THE NURSE SELECT OPTION 2, PLEASE LEAVE A MESSAGE INCLUDING: YOUR NAME DATE OF BIRTH CALL BACK NUMBER REASON FOR CALL**this is important as we prioritize the call backs  YOU WILL RECEIVE A CALL BACK THE SAME DAY AS LONG AS YOU CALL BEFORE 4:00 PM

## 2023-05-30 ENCOUNTER — Ambulatory Visit (HOSPITAL_COMMUNITY)
Admission: RE | Admit: 2023-05-30 | Discharge: 2023-05-30 | Disposition: A | Discharge status: Home / Self Care | Payer: Self-pay | Source: Ambulatory Visit | Attending: Cardiology | Admitting: Cardiology

## 2023-05-30 DIAGNOSIS — Z006 Encounter for examination for normal comparison and control in clinical research program: Secondary | ICD-10-CM

## 2023-05-30 DIAGNOSIS — I110 Hypertensive heart disease with heart failure: Secondary | ICD-10-CM | POA: Insufficient documentation

## 2023-05-30 DIAGNOSIS — I5022 Chronic systolic (congestive) heart failure: Secondary | ICD-10-CM | POA: Insufficient documentation

## 2023-05-30 DIAGNOSIS — I358 Other nonrheumatic aortic valve disorders: Secondary | ICD-10-CM | POA: Insufficient documentation

## 2023-05-30 LAB — ECHOCARDIOGRAM COMPLETE
Area-P 1/2: 1.99 cm2
Calc EF: 32.9 %
Est EF: 30
S' Lateral: 4.6 cm
Single Plane A2C EF: 39.1 %
Single Plane A4C EF: 32.6 %

## 2023-05-30 MED ORDER — PERFLUTREN LIPID MICROSPHERE
1.0000 mL | INTRAVENOUS | Status: AC | PRN
  Administered 2023-05-30: 4 mL via INTRAVENOUS

## 2023-05-30 NOTE — Research (Signed)
 SITE: [id number]     Subject # [id number]   Subprotocol: A  Inclusion Criteria  Patients who meet all of the following criteria are eligible for enrollment as study participants:  Yes No  Age > 64 years old X   Eligible to wear Holter Study X    Exclusion Criteria  Patients who meet any of these criteria are not eligible for enrollment as study participants: Yes No  1. Receiving any mechanical (respiratory or circulatory) or renal support therapy at Screening or during Visit #1.  X  2.  Any other conditions that in the opinion of the investigators are likely to prevent compliance with the study protocol or pose a safety concern if the subject participates in the study.  X  3. Poor tolerance, namely susceptible to severe skin allergies from ECG adhesive patch application.  X   Protocol: REV H                                     Residential Zip code [address] (First 3 digits ONLY)                                             [facility] Informed Consent   Subject Name: [name] [name]  Subject met inclusion and exclusion criteria.  The informed consent form, study requirements and expectations were reviewed with the subject. Subject had opportunity to read consent and questions and concerns were addressed prior to the signing of the consent form.  The subject verbalized understanding of the trial requirements.  The subject agreed to participate in the [facility] EF ACT trial and signed the informed consent at 08:31 on 30-May-2023.  The informed consent was obtained prior to performance of any protocol-specific procedures for the subject.  A copy of the signed informed consent was given to the subject and a copy was placed in the subject's medical record.   Dyanne Iha          Current Outpatient Medications:    acetaminophen (TYLENOL) 500 MG tablet, Take 1,000 mg by mouth every 8 (eight) hours as needed for moderate pain., Disp: , Rfl:    amiodarone (PACERONE) 200 MG tablet, Take 1 tablet (200 mg  total) by mouth daily., Disp: 90 tablet, Rfl: 3   ARIPiprazole (ABILIFY) 10 MG tablet, Take 10 mg by mouth daily as needed (for ptsd)., Disp: , Rfl:    aspirin EC 81 MG tablet, Take 1 tablet (81 mg total) by mouth daily., Disp: , Rfl:    aspirin-sod bicarb-citric acid (ALKA-SELTZER) 325 MG TBEF tablet, Take 650 mg by mouth every 6 (six) hours as needed (indigestion)., Disp: , Rfl:    bisacodyl (DULCOLAX) 5 MG EC tablet, Take by mouth., Disp: , Rfl:    carvedilol (COREG) 25 MG tablet, Take 1 tablet (25 mg total) by mouth 2 (two) times daily with a meal., Disp: 60 tablet, Rfl: 0   cholecalciferol (VITAMIN D) 1000 UNITS tablet, Take 1,000 Units by mouth daily., Disp: , Rfl:    ciclopirox (PENLAC) 8 % solution, Apply 1 application  topically daily. APPLY DAILY TO FUNGAL TOE NAILS REMOVE WITH NAIL POLISH REMOVER ONCE WEEKLY APPLY DAILY TO FUNGAL TOE NAILS REMOVE WITH NAIL POLISH REMOVER ONCE WEEKLY FUNGAL TOE NAILS, Disp: , Rfl:    diclofenac  Sodium (VOLTAREN) 1 % GEL, Apply 2 g topically 4 (four) times daily as needed (shoulder pain)., Disp: , Rfl:    fish oil-omega-3 fatty acids 1000 MG capsule, Take 1 g by mouth daily. , Disp: , Rfl:    fluticasone (FLONASE) 50 MCG/ACT nasal spray, Place 2 sprays into both nostrils as needed for allergies or rhinitis., Disp: , Rfl:    furosemide (LASIX) 20 MG tablet, Patient can take 20 mg PRN. Can take if > 3 lbs weight gain in 24 hours or > 5 lbs in 1 week or developing of leg swelling., Disp: 45 tablet, Rfl: 3   isosorbide-hydrALAZINE (BIDIL) 20-37.5 MG tablet, Take 0.5 tablets by mouth 3 (three) times daily., Disp: 45 tablet, Rfl: 3   lidocaine (LIDODERM) 5 %, Place 1 patch onto the skin daily as needed (pain). Remove & Discard patch within 12 hours or as directed by MD, Disp: , Rfl:    loratadine (CLARITIN) 10 MG tablet, Take 10 mg by mouth daily as needed for allergies., Disp: , Rfl:    Multiple Vitamin (MULTIVITAMIN) tablet, Take 1 tablet by mouth daily.  , Disp:  , Rfl:    nitroGLYCERIN (NITROSTAT) 0.4 MG SL tablet, Place 1 tablet (0.4 mg total) under the tongue every 5 (five) minutes x 3 doses as needed for chest pain., Disp: 25 tablet, Rfl: 1   omeprazole (PRILOSEC) 20 MG capsule, Take 20 mg by mouth daily as needed (gerd/reflux)., Disp: , Rfl:    PARoxetine (PAXIL) 40 MG tablet, Take 40 mg by mouth daily as needed (for PTSD)., Disp: , Rfl:    sacubitril-valsartan (ENTRESTO) 97-103 MG, Take 1 tablet by mouth 2 (two) times daily. (Patient taking differently: Take 0.5 tablets by mouth 2 (two) times daily.), Disp: 180 tablet, Rfl: 2   simvastatin (ZOCOR) 80 MG tablet, Take 40 mg by mouth at bedtime., Disp: , Rfl:    spironolactone (ALDACTONE) 25 MG tablet, Take 1 tablet (25 mg total) by mouth daily., Disp: 90 tablet, Rfl: 3   Tetrahydrozoline HCl (VISINE OP), Place 1 drop into both eyes daily as needed (irritation)., Disp: , Rfl:    UNABLE TO FIND, Take 1-2 tablets by mouth See admin instructions. INV-CSP #2002 METFORMIN OR PLACEBO TAB  TAKE 1 TABLET BY MOUTH EVERY DAY WITH EVENING MEAL. WHEN INSTRUCTED, INCREASE DOSE TO 2 TABLETS DAILY WITH EVENING MEAL. DO NOT CRUSH, CHEW OR SPLIT., Disp: , Rfl:    vitamin C (ASCORBIC ACID) 500 MG tablet, Take 500 mg by mouth daily., Disp: , Rfl:  No current facility-administered medications for this visit.  Facility-Administered Medications Ordered in Other Visits:    perflutren lipid microspheres (DEFINITY) IV suspension, 1-10 mL, Intravenous, PRN, Sabharwal, Aditya, DO, 4 mL at 05/30/23 0865

## 2023-06-10 NOTE — Progress Notes (Signed)
 Remote ICD transmission.

## 2023-06-10 NOTE — Addendum Note (Signed)
 Addended by: Elease Etienne A on: 06/10/2023 09:18 AM   Modules accepted: Orders

## 2023-06-16 ENCOUNTER — Ambulatory Visit: Attending: Cardiology

## 2023-06-16 DIAGNOSIS — Z95810 Presence of automatic (implantable) cardiac defibrillator: Secondary | ICD-10-CM | POA: Diagnosis not present

## 2023-06-16 DIAGNOSIS — I5022 Chronic systolic (congestive) heart failure: Secondary | ICD-10-CM | POA: Diagnosis not present

## 2023-06-20 ENCOUNTER — Telehealth: Payer: Self-pay

## 2023-06-20 NOTE — Telephone Encounter (Signed)
 Remote ICM transmission received.  Attempted call to patient regarding ICM remote transmission and left detailed message per DPR.  Left ICM phone number and advised to return call for any fluid symptoms or questions. Next ICM remote transmission scheduled 07/21/2023.

## 2023-06-20 NOTE — Progress Notes (Signed)
 EPIC Encounter for ICM Monitoring  Patient Name: [name] [name] is a 63 y.o. male Date: 06/20/2023 Primary Care Physican: [facility] Primary Cardiologist: [name]/Sabharwal  Electrophysiologist: [name] 12/24/2022 Weight: 240 lbs    01/29/2024 Weight: 238 lbs  03/07/2023 Weight: 238 lbs   04/10/2023 Weight: 238 lbs  05/13/2023 Weight: 233 lbs                     Attempted call to patient and unable to reach.  Left detailed message per DPR regarding transmission.  Transmission results reviewed.    CorVue thoracic impedance suggesting possible fluid accumulation starting 4/1-4/11 and changed to possible dryness starting 4/12 (could be in response to taking PRN Lasix  during decreased impedance)   Prescribed:  Furosemide  20 mg take 1 tablet by mouth PRN.  Can take if > 3 lbs weight gain in 24 hours or > 5 lbs in a week or developing of leg swelling Spironolactone  25 mg take 1 tablet daily   Labs: 01/13/2023 Creatinine 1.09, BUN 13, Potassium 4.2, Sodium 137, GFR >60  12/18/2022 Creatinine 1.05, BUN 15, Potassium 4.1, Sodium 137, GFR >60  12/02/2022 Creatinine 1.21, BUN 21, Potassium 3.9, Sodium 133, GFR >60  A complete set of results can be found in Results Review.   Recommendations:  Left voice mail with ICM number and encouraged to call if experiencing any fluid symptoms.   Follow-up plan: ICM clinic phone appointment on 07/21/2023.   91 day device clinic remote transmission 07/29/2023.     EP/Cardiology Office Visits:  08/21/2023 with Dr Bruce Caper.   Recall 08/27/2023 with Mertha Abrahams, PA.  Recall 03/19/2023 with Dr Katheryne Pane APP.   Copy of ICM check sent to Dr. [name].   3 month ICM trend: 06/15/2023.    12-14 Month ICM trend:     Almyra Jain, RN 06/20/2023 12:33 PM

## 2023-07-21 ENCOUNTER — Ambulatory Visit: Attending: Cardiology

## 2023-07-21 DIAGNOSIS — Z95810 Presence of automatic (implantable) cardiac defibrillator: Secondary | ICD-10-CM

## 2023-07-21 DIAGNOSIS — I5022 Chronic systolic (congestive) heart failure: Secondary | ICD-10-CM | POA: Diagnosis not present

## 2023-07-23 NOTE — Progress Notes (Signed)
 EPIC Encounter for ICM Monitoring  Patient Name: [name] [name] is a 63 y.o. male Date: 07/23/2023 Primary Care Physican: [facility] Primary Cardiologist: [name]/Sabharwal  Electrophysiologist: [name] 12/24/2022 Weight: 240 lbs    01/29/2024 Weight: 238 lbs  03/07/2023 Weight: 238 lbs   04/10/2023 Weight: 238 lbs  05/13/2023 Weight: 233 lbs   07/23/2023 Weight: 232-233 lbs                   Spoke with patient and heart failure questions reviewed.  Transmission results reviewed.  Pt asymptomatic for fluid accumulation.  Reports feeling well and had no fluid sx during decreased impedance.  He was eating restaurant foods a lot during decreased impedance but didn't take a PRN Lasix  since he did not have any symptoms.   CorVue thoracic impedance suggesting possible fluid accumulation starting 4/28-5/12.  Suggesting to possible dryness starting 4/12-4/27.   Prescribed:  Furosemide  20 mg take 1 tablet by mouth PRN.  Can take if > 3 lbs weight gain in 24 hours or > 5 lbs in a week or developing of leg swelling Spironolactone  25 mg take 1 tablet daily   Labs: 05/21/2023 Creatinine 1.18, BUN 16, Potassium 4.1, Sodium 136, >60 A complete set of results can be found in Results Review.   Recommendations:  Encouraged to limit salt intake and to take PRN Lasix  if he has experiences any fluid symptoms.    Follow-up plan: ICM clinic phone appointment on 09/22/2023.   91 day device clinic remote transmission 07/29/2023.     EP/Cardiology Office Visits:  08/21/2023 with Dr Bruce Caper.  Provided EP scheduling number to make EP appt.   Recall 08/27/2023 with Mertha Abrahams, PA.  Recall 03/19/2023 with Dr Katheryne Pane APP.   Copy of ICM check sent to Dr. [name].   3 month ICM trend: 07/20/2023.    12-14 Month ICM trend:     Almyra Jain, RN 07/23/2023 8:12 AM

## 2023-07-29 ENCOUNTER — Ambulatory Visit (INDEPENDENT_AMBULATORY_CARE_PROVIDER_SITE_OTHER): Payer: Self-pay

## 2023-07-29 DIAGNOSIS — I255 Ischemic cardiomyopathy: Secondary | ICD-10-CM | POA: Diagnosis not present

## 2023-07-29 DIAGNOSIS — I5022 Chronic systolic (congestive) heart failure: Secondary | ICD-10-CM

## 2023-07-31 LAB — CUP PACEART REMOTE DEVICE CHECK
Battery Remaining Longevity: 96 mo
Battery Remaining Percentage: 78 %
Battery Voltage: 3.01 V
Brady Statistic RV Percent Paced: 1 %
Date Time Interrogation Session: 20250527123830
HighPow Impedance: 68 Ohm
Implantable Lead Connection Status: 753985
Implantable Lead Implant Date: 20120605
Implantable Lead Location: 753860
Implantable Pulse Generator Implant Date: 20230224
Lead Channel Impedance Value: 400 Ohm
Lead Channel Pacing Threshold Amplitude: 1 V
Lead Channel Pacing Threshold Pulse Width: 0.5 ms
Lead Channel Sensing Intrinsic Amplitude: 11.8 mV
Lead Channel Setting Pacing Amplitude: 2.5 V
Lead Channel Setting Pacing Pulse Width: 0.5 ms
Lead Channel Setting Sensing Sensitivity: 0.5 mV
Pulse Gen Serial Number: 111056600
Zone Setting Status: 755011

## 2023-08-01 ENCOUNTER — Ambulatory Visit: Payer: Self-pay | Admitting: Cardiology

## 2023-08-20 NOTE — Progress Notes (Signed)
 ADVANCED HEART FAILURE CLINIC NOTE  Referring Physician: [facility]  Primary Care: [facility] Primary Cardiologist: Ozell Fell, MD HF: Ria Commander, DO  CC: Systolic Heart Failure  HPI: [name] [name] is a 63 y.o. male with systolic heart failure secondary to ischemic cardiomyopathy (EF 25 to 30%), CAD status post anterior MI in 2008 status post BMS to the LAD, history of VT with ICD in place presenting today for follow up.  Over the past year Mr. Brumbach has had multiple admissions.  He was admitted in January 2024 for VT status post ICD shock.  Left heart cath with CTO of the mid to distal LAD filled with left to left collaterals.  He was admitted again in September 2024 with recurrent VT requiring external defibrillation's and was loaded with amiodarone .  Left heart cath at that time was unchanged.  Echocardiogram with LVEF of 25 to 30%.  Since that time he has been seen in [facility] clinic where he reported no further episodes of ventricular tachycardia and reported being able to do most ADLs without significant limitations.  At that time GDMT was further uptitrated and he was referred to advanced heart failure due to concern that he may require advanced therapies.  Interval hx:  - Doing very well from a symptomatic standpoint. Compliant with all medications. No recent hospitalizations. Overall NYHA IIB functional status.   Current Outpatient Medications  Medication Sig Dispense Refill   acetaminophen  (TYLENOL ) 500 MG tablet Take 1,000 mg by mouth every 8 (eight) hours as needed for moderate pain.     amiodarone  (PACERONE ) 200 MG tablet Take 1 tablet (200 mg total) by mouth daily. 90 tablet 3   ARIPiprazole  (ABILIFY ) 10 MG tablet Take 10 mg by mouth daily as needed (for ptsd).     aspirin  EC 81 MG tablet Take 1 tablet (81 mg total) by mouth daily.     aspirin -sod bicarb-citric acid (ALKA-SELTZER) 325 MG TBEF tablet Take 650 mg by mouth every 6  (six) hours as needed (indigestion).     bisacodyl (DULCOLAX) 5 MG EC tablet Take by mouth.     carvedilol  (COREG ) 25 MG tablet Take 1 tablet (25 mg total) by mouth 2 (two) times daily with a meal. 60 tablet 0   cholecalciferol (VITAMIN D) 1000 UNITS tablet Take 1,000 Units by mouth daily.     ciclopirox (PENLAC) 8 % solution Apply 1 application  topically daily. APPLY DAILY TO FUNGAL TOE NAILS REMOVE WITH NAIL POLISH REMOVER ONCE WEEKLY APPLY DAILY TO FUNGAL TOE NAILS REMOVE WITH NAIL POLISH REMOVER ONCE WEEKLY FUNGAL TOE NAILS     diclofenac Sodium (VOLTAREN) 1 % GEL Apply 2 g topically 4 (four) times daily as needed (shoulder pain).     fish oil-omega-3 fatty acids 1000 MG capsule Take 1 g by mouth daily.      fluticasone  (FLONASE ) 50 MCG/ACT nasal spray Place 2 sprays into both nostrils as needed for allergies or rhinitis.     furosemide  (LASIX ) 20 MG tablet Patient can take 20 mg PRN. Can take if > 3 lbs weight gain in 24 hours or > 5 lbs in 1 week or developing of leg swelling. 45 tablet 3   isosorbide-hydrALAZINE  (BIDIL) 20-37.5 MG tablet Take 1 tablet by mouth 3 (three) times daily.     lidocaine  (LIDODERM ) 5 % Place 1 patch onto the skin daily as needed (pain). Remove & Discard patch within 12 hours or as directed by MD  loratadine  (CLARITIN ) 10 MG tablet Take 10 mg by mouth daily as needed for allergies.     Multiple Vitamin (MULTIVITAMIN) tablet Take 1 tablet by mouth daily.       nitroGLYCERIN  (NITROSTAT ) 0.4 MG SL tablet Place 1 tablet (0.4 mg total) under the tongue every 5 (five) minutes x 3 doses as needed for chest pain. 25 tablet 1   omeprazole (PRILOSEC) 20 MG capsule Take 20 mg by mouth daily as needed (gerd/reflux).     PARoxetine  (PAXIL ) 40 MG tablet Take 40 mg by mouth daily as needed (for PTSD).     sacubitril -valsartan  (ENTRESTO ) 97-103 MG Take 1 tablet by mouth 2 (two) times daily. 180 tablet 2   simvastatin  (ZOCOR ) 80 MG tablet Take 40 mg by mouth at bedtime.      spironolactone  (ALDACTONE ) 25 MG tablet Take 1 tablet (25 mg total) by mouth daily. 90 tablet 3   Tetrahydrozoline HCl (VISINE OP) Place 1 drop into both eyes daily as needed (irritation).     vitamin C (ASCORBIC ACID) 500 MG tablet Take 500 mg by mouth daily.     No current facility-administered medications for this encounter.    No Known Allergies  PHYSICAL EXAM: Vitals:   08/21/23 0856  BP: 120/78  Pulse: 63  SpO2: 93%   GENERAL: NAD Lungs- CTA CARDIAC:  JVP: 6 cm          Normal rate with regular rhythm. no murmur.  Pulses 2+. no edema.  ABDOMEN: Soft, non-tender, non-distended.  EXTREMITIES: Warm and well perfused.  NEUROLOGIC: No obvious FND   DATA REVIEW  ECG: 01/13/23: sinus bradycardia  As per my personal interpretation  ECHO: 12/02/22: LVEF 25%-30% As per my personal interpretation  CATH: LHC 12/02/22   Mid LAD to Dist LAD lesion is 100% stenosed.   Right dominant coronary circulation.  Single vessel CAD with CTO of the mid to distal LAD throughout previously stented areas.  Chronic calcification of the LV apex likely secondary to old anterior MI.  LVEDP mildly elevated at 20.  No significant changes from LHC done in 04/01/22.    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 NYHA class / AHA Stage:IIB;  Volume status & Diuretics: Euvolemic Vasodilators: Entresto  97/103 mg twice daily; Bidil  1 tab TID Beta-Blocker: continue coreg  25mg  BID.  MRA: Spironolactone  25 mg daily Cardiometabolic: Holding due to frequent GU infections Devices therapies & Valvulopathies: ICD in place Advanced therapies: significant improvement with addition of Bidil and GDMT. At this time no indication for advanced therapies or CPET testing. Will continue to follow 1-2x yearly. Plan on repeat TTE prior to next follow up to  ensure stable LV function.   2.  Coronary artery disease -Anterior MI in 2008 with PCI to the LAD.  Repeat left heart cath on 1/24 and 9/24 with CTO of the mid to distal LAD followed by left to left collaterals. -Followed by Dr. Wonda.  Continue aspirin , statin and beta-blocker. -Decrease ASA to 81mg  daily -No chest pain.   3.  Ventricular tachycardia -X 2 hospitalizations this year for recurrent VT. -Followed by EP.  Currently on amiodarone  200 mg daily. -TSH/T4 from 07/10/23: 0.224, Free T4 elevated at 1.66.  - Repeat T4 today down to 1.11.  TSH remains suppressed at 0.139.  -Will discuss with EP.   4. HTN - well  controlled.   I spent 35 minutes caring for this patient today including face to face time, ordering and reviewing labs, reviewing echo with him, discussing importance of med compliance, seeing the patient, documenting in the record, and arranging follow ups.    Terricka Onofrio Advanced Heart Failure Mechanical Circulatory Support

## 2023-08-21 ENCOUNTER — Encounter (HOSPITAL_COMMUNITY): Payer: Self-pay | Admitting: Cardiology

## 2023-08-21 ENCOUNTER — Ambulatory Visit (HOSPITAL_COMMUNITY)
Admission: RE | Admit: 2023-08-21 | Discharge: 2023-08-21 | Disposition: A | Discharge status: Home / Self Care | Source: Ambulatory Visit | Attending: Cardiology | Admitting: Cardiology

## 2023-08-21 VITALS — BP 120/78 | HR 63

## 2023-08-21 DIAGNOSIS — I5020 Unspecified systolic (congestive) heart failure: Secondary | ICD-10-CM

## 2023-08-21 DIAGNOSIS — Z79899 Other long term (current) drug therapy: Secondary | ICD-10-CM

## 2023-08-21 DIAGNOSIS — I5032 Chronic diastolic (congestive) heart failure: Secondary | ICD-10-CM | POA: Diagnosis not present

## 2023-08-21 DIAGNOSIS — Z95810 Presence of automatic (implantable) cardiac defibrillator: Secondary | ICD-10-CM | POA: Diagnosis not present

## 2023-08-21 DIAGNOSIS — I252 Old myocardial infarction: Secondary | ICD-10-CM | POA: Insufficient documentation

## 2023-08-21 DIAGNOSIS — Z7982 Long term (current) use of aspirin: Secondary | ICD-10-CM | POA: Insufficient documentation

## 2023-08-21 DIAGNOSIS — Z5181 Encounter for therapeutic drug level monitoring: Secondary | ICD-10-CM

## 2023-08-21 DIAGNOSIS — I255 Ischemic cardiomyopathy: Secondary | ICD-10-CM | POA: Insufficient documentation

## 2023-08-21 DIAGNOSIS — I472 Ventricular tachycardia, unspecified: Secondary | ICD-10-CM

## 2023-08-21 DIAGNOSIS — I2510 Atherosclerotic heart disease of native coronary artery without angina pectoris: Secondary | ICD-10-CM | POA: Insufficient documentation

## 2023-08-21 DIAGNOSIS — Z955 Presence of coronary angioplasty implant and graft: Secondary | ICD-10-CM | POA: Insufficient documentation

## 2023-08-21 DIAGNOSIS — I110 Hypertensive heart disease with heart failure: Secondary | ICD-10-CM | POA: Insufficient documentation

## 2023-08-21 LAB — BASIC METABOLIC PANEL WITH GFR
Anion gap: 12 (ref 5–15)
BUN: 16 mg/dL (ref 8–23)
CO2: 22 mmol/L (ref 22–32)
Calcium: 9.5 mg/dL (ref 8.9–10.3)
Chloride: 103 mmol/L (ref 98–111)
Creatinine, Ser: 0.9 mg/dL (ref 0.61–1.24)
GFR, Estimated: >60 mL/min (ref >60)
Glucose, Bld: 148 mg/dL — ABNORMAL HIGH (ref 70–99)
Potassium: 4.2 mmol/L (ref 3.5–5.1)
Sodium: 137 mmol/L (ref 135–145)

## 2023-08-21 LAB — TSH: TSH: 0.139 u[IU]/mL — ABNORMAL LOW (ref 0.350–4.500)

## 2023-08-21 LAB — BRAIN NATRIURETIC PEPTIDE: B Natriuretic Peptide: 112.4 pg/mL — ABNORMAL HIGH (ref 0.0–100.0)

## 2023-08-21 LAB — T4, FREE: Free T4: 1.11 ng/dL (ref 0.61–1.12)

## 2023-08-21 NOTE — Patient Instructions (Signed)
 Medication Changes:  CONTINUE BIDIL 1 TABLET THREE TIMES DAILY   Lab Work:  Labs done today, your results will be available in MyChart, we will contact you for abnormal readings.  Testing/Procedures:  ECHOCARDIOGRAM AS SCHEDULED   Follow-Up in: 3 MONTHS AS SCHEDULED   At the Advanced Heart Failure Clinic, you and your health needs are our priority. We have a designated team specialized in the treatment of Heart Failure. This Care Team includes your primary Heart Failure Specialized Cardiologist (physician), Advanced Practice Providers (APPs- Physician Assistants and Nurse Practitioners), and Pharmacist who all work together to provide you with the care you need, when you need it.   You may see any of the following providers on your designated Care Team at your next follow up:  Dr. Jules Oar Dr. Peder Bourdon Dr. [name] Dr. Judyth Nunnery Nieves Bars, NP Ruddy Corral, [place] [facility] [place], [place] Dennise Fitz, NP [place] Lee, NP Luster Salters, PharmD   Please be sure to bring in all your medications bottles to every appointment.   Need to Contact Us :  If you have any questions or concerns before your next appointment please send us  a message through [place] or call our office at [phone].    TO LEAVE A MESSAGE FOR THE NURSE SELECT OPTION 2, PLEASE LEAVE A MESSAGE INCLUDING: YOUR NAME DATE OF BIRTH CALL BACK NUMBER REASON FOR CALL**this is important as we prioritize the call backs  YOU WILL RECEIVE A CALL BACK THE SAME DAY AS LONG AS YOU CALL BEFORE 4:00 PM

## 2023-08-22 ENCOUNTER — Ambulatory Visit: Attending: Physician Assistant | Admitting: Physician Assistant

## 2023-08-22 VITALS — BP 110/66 | HR 60 | Ht 70.0 in | Wt 239.0 lb

## 2023-08-22 DIAGNOSIS — T462X5A Adverse effect of other antidysrhythmic drugs, initial encounter: Secondary | ICD-10-CM

## 2023-08-22 DIAGNOSIS — Z95810 Presence of automatic (implantable) cardiac defibrillator: Secondary | ICD-10-CM | POA: Diagnosis not present

## 2023-08-22 DIAGNOSIS — E0580 Other thyrotoxicosis without thyrotoxic crisis or storm: Secondary | ICD-10-CM | POA: Diagnosis not present

## 2023-08-22 DIAGNOSIS — I5022 Chronic systolic (congestive) heart failure: Secondary | ICD-10-CM

## 2023-08-22 DIAGNOSIS — Z79899 Other long term (current) drug therapy: Secondary | ICD-10-CM | POA: Diagnosis not present

## 2023-08-22 DIAGNOSIS — I472 Ventricular tachycardia, unspecified: Secondary | ICD-10-CM | POA: Diagnosis not present

## 2023-08-22 DIAGNOSIS — I255 Ischemic cardiomyopathy: Secondary | ICD-10-CM | POA: Diagnosis not present

## 2023-08-22 LAB — CUP PACEART INCLINIC DEVICE CHECK
Battery Remaining Longevity: 98 mo
Brady Statistic RV Percent Paced: 0.04 %
Date Time Interrogation Session: 20250620163502
HighPow Impedance: 67.5 Ohm
Implantable Lead Connection Status: 753985
Implantable Lead Implant Date: 20120605
Implantable Lead Location: 753860
Implantable Pulse Generator Implant Date: 20230224
Lead Channel Impedance Value: 425 Ohm
Lead Channel Pacing Threshold Amplitude: 1 V
Lead Channel Pacing Threshold Amplitude: 1 V
Lead Channel Pacing Threshold Pulse Width: 0.5 ms
Lead Channel Pacing Threshold Pulse Width: 0.5 ms
Lead Channel Sensing Intrinsic Amplitude: 11.8 mV
Lead Channel Setting Pacing Amplitude: 2.5 V
Lead Channel Setting Pacing Pulse Width: 0.5 ms
Lead Channel Setting Sensing Sensitivity: 0.5 mV
Pulse Gen Serial Number: 111056600
Zone Setting Status: 755011

## 2023-08-22 MED ORDER — AMIODARONE HCL 100 MG PO TABS: 100.0000 mg | ORAL_TABLET | Freq: Every day | ORAL | 3 refills | Status: DC

## 2023-08-22 NOTE — Progress Notes (Signed)
 Cardiology Office Note:  .   Date:  08/22/2023  ID:  [name] [name], DOB Sep 08, 1960, MRN [id number] [facility]CP: [name]  [facility] Providers Cardiologist:  Arnoldo Lapping, MD Cardiology APP:  [name], PA-C  Electrophysiologist:  [name], MD  Advanced Heart Failure:  [name], DO {  History of Present Illness: [name] [name]   [name] [name] is a 63 y.o. male w/PMHx of CAD (MI 2008 tx w/BMS to LAD), ICM, chronic CHF, VT  Admitted Jan 2024 w/VT > LHC showing single-vessel coronary artery disease with total occlusion of the mid to distal LAD within the previously implanted stents, left to left collaterals present supplying the apical LAD, widely patent left main, left circumflex, ramus intermedius, and RCA, with minimal irregularity but no significant stenose. CAD managed medically.   Sept 2024, admitted with VT (required external defibrillation) started on amiodarone  LHC done and unchanged LV apex also noted to be chronically calcified, likely 2/2 old MI  VT was slower then detection rate and required external CV Was hemodynamically tolerated but very symptomatic Discussed plan to re-program VT zones with lower monitoring zone and lower therapy zone with more aggressive ATP to treat further similar arrhythmias   VT Zone 1 150 monitor  VT Zone 2 171 ATP x8 at 85%, then ATP x8 at 82%, then shock x2  VF Unchanged at 214  Saw HF team, 12/18/22, not able to be on SGLT2i with GU infections, pending labs for addition of lasix , suspect trajectory leading to advanced therapies  I saw him Nov 2024 He is doing well Weight is stable Excellent medication compliance Denies bloating, edema, volume OL No CP, palpitations No symptoms like that of his VT No shocks No syncope or syncope No VT No changes made Volume stable  Seeing Dr [name] a few times since as well as Drs. [name] and [name]  Dr. [name] 02/28/23, no VT, amio continued  Dr. [name]  04/03/23, chronic occlusion in the LAD with evidence of old anterior infarct based on calcification of the anterior wall, and no other obstructive disease. He is tolerating amiodarone  without side effect. He has mild exertional dyspnea which is unchanged over time. Discussed amio survellience, no changes made  Dr. [name] yesterday 08/21/23, doing well, compliant with his meds,class IIB symptoms TSH /T4 from 07/10/23: 0.224, Free T4 elevated at 1.66.  - Repeat T4 today down to 1.11.  TSH remains suppressed at 0.139.  Planned to d/w EP   Today's visit is scheduled for 84mo device visit ROS:  He is a bit concerned about his thyroid, mentions not a lot of confidence in his [facility] PMD's in managing it He feels well though.  No CP, palpitations or cardiac awareness No tremor, tremulousness, symptoms of hyperthyroid. No SOB, minimal DOE No near syncope or syncope No shocks  Device information [employer] single chamber ICD implanted 04/27/21  ++ appropriate therapies  Arrhythmia/AAD hx Amiodarone  started Sept 2024  Studies Reviewed: [name] [name]    EKG not done today  DEVICE interrogation done today and reviewed by myself Battery and lead measurements are good No VT (since yesterday (likely checked yesterday at the HF clinic, no mention of any arrhythmias in his note that I saw) Last remote 5/27 with no VT   LHC 12/02/22   Mid LAD to Dist LAD lesion is 100% stenosed. Right dominant coronary circulation.  Single vessel CAD with CTO of the mid to distal LAD throughout previously stented areas.  Chronic calcification of  the LV apex likely secondary to old anterior MI.  LVEDP mildly elevated at 20.  No significant changes from LHC done in 04/01/22.    2D Echo 12/02/22 1. Severely reduced CI by LVOT VTI (0.9L/min/m2). Left ventricular  ejection fraction, by estimation, is 25 to 30%. The left ventricle has  severely decreased function. Left ventricular endocardial border not  optimally defined to evaluate  regional wall  motion but the mid-distal anterior walls, the apex, and mid-distal  anterolateral wall appear akinetic. The left ventricular internal cavity  size was severely dilated. There is moderate eccentric left ventricular  hypertrophy. Left ventricular diastolic  parameters are consistent with Grade II diastolic dysfunction  (pseudonormalization).   2. Right ventricular systolic function was not well visualized. The right  ventricular size is not well visualized.   3. Left atrial size was moderately dilated.   4. The mitral valve is grossly normal. Trivial mitral valve  regurgitation. No evidence of mitral stenosis.   5. The aortic valve is tricuspid. Aortic valve regurgitation is not  visualized. No aortic stenosis is present.      Risk Assessment/Calculations:    Physical Exam:   VS:  There were no vitals taken for this visit.   Wt Readings from Last 3 Encounters:  05/21/23 236 lb (107 kg)  04/03/23 238 lb 6.4 oz (108.1 kg)  02/28/23 242 lb 6.4 oz (110 kg)    GEN: Well nourished, well developed in no acute distress NECK: No JVD; No carotid bruits CARDIAC: RRR, no murmurs, rubs, gallops RESPIRATORY: CTA b/l without rales, wheezing or rhonchi  ABDOMEN: Soft, non-tender, non-distended EXTREMITIES: No edema; No deformity   ICD site: is stable, no thinning, fluctuation, tethering  ASSESSMENT AND PLAN: .    ICD Intact function No programming changes made   VT on amiodarone  TSH down trending and low with abn T4  Discussed today He is given a copy of his labs from yesterday and advised he see his PMD for evaluation and management, assured that his PMD can take care thyroid w/u and any management they think may be needed Suspect though this is 2/2 the amio  He had 2 VT episodes last year, will reduce his amiodarone  to 100mg  daily plan to repeat TSH/Free T3, T4, and get LFTs as well in 6-8 weeks    ICM Chronic CHF CorVue is below threshold with no symptoms or  exam finding sto support volume OL  Just saw Dr. [name] yesterday  Sees him again in Sept  Discussed effort to minimize his visits, will check his labs as discussed, he will see HF clinic in 375mo, us  in 75mo, sooner if needed   CAD No anginal symptoms Caths as above C/w Dr. Marjean Sierras       Dispo: as above, remotes as usual, sooner if needed  Signed, Debbie Fails, PA-C

## 2023-08-22 NOTE — Patient Instructions (Signed)
 Medication Instructions:   START TAKING :  AMIODARONE  100 MG ONCE A DAY   *If you need a refill on your cardiac medications before your next appointment, please call your pharmacy*  Lab Work:  PLEASE GO DOWN STAIRS  [facility]  FIRST FLOOR   ( GET OFF ELEVATORS WALK TOWARDS WAITING AREA LAB LOCATED BY PHARMACY): RETURN IN 6-8 WEEKS FOR  LFT TSH FREE T4  FREE T3   If you have labs (blood work) drawn today and your tests are completely normal, you will receive your results only by: MyChart Message (if you have MyChart) OR A paper copy in the mail If you have any lab test that is abnormal or we need to change your treatment, we will call you to review the results.   Testing/Procedures: NONE ORDERED  TODAY    Follow-Up: At [facility], you and your health needs are our priority.  As part of our continuing mission to provide you with exceptional heart care, our providers are all part of one team.  This team includes your primary Cardiologist (physician) and Advanced Practice Providers or APPs (Physician Assistants and Nurse Practitioners) who all work together to provide you with the care you need, when you need it.  Your next appointment:   6 month(s)  Provider:   You may see [name], MD or the following Advanced Practice Providers on your designated Care Team:   Mertha Abrahams, [place]     We recommend signing up for the patient portal called MyChart.  Sign up information is provided on this After Visit Summary.  MyChart is used to connect with patients for Virtual Visits (Telemedicine).  Patients are able to view lab/test results, encounter notes, upcoming appointments, etc.  Non-urgent messages can be sent to your provider as well.   To learn more about what you can do with MyChart, go to [email or web address].   Other Instructions

## 2023-08-27 ENCOUNTER — Ambulatory Visit: Payer: Self-pay | Admitting: Cardiology

## 2023-09-19 ENCOUNTER — Encounter: Payer: Self-pay | Admitting: Advanced Practice Midwife

## 2023-09-22 ENCOUNTER — Ambulatory Visit: Attending: Cardiology

## 2023-09-22 DIAGNOSIS — I5022 Chronic systolic (congestive) heart failure: Secondary | ICD-10-CM | POA: Diagnosis not present

## 2023-09-22 DIAGNOSIS — Z95810 Presence of automatic (implantable) cardiac defibrillator: Secondary | ICD-10-CM | POA: Diagnosis not present

## 2023-09-22 NOTE — Addendum Note (Signed)
 Addended by: TAWNI DRILLING D on: 09/22/2023 12:00 PM   Modules accepted: Orders

## 2023-09-22 NOTE — Progress Notes (Signed)
 Remote ICD transmission.

## 2023-09-24 NOTE — Progress Notes (Signed)
 EPIC Encounter for ICM Monitoring  Patient Name: [name] [name] is a 63 y.o. male Date: 09/24/2023 Primary Care Physican: [facility] Primary Cardiologist: [name]/Sabharwal  Electrophysiologist: [name] 12/24/2022 Weight: 240 lbs    01/29/2024 Weight: 238 lbs  03/07/2023 Weight: 238 lbs   04/10/2023 Weight: 238 lbs  05/13/2023 Weight: 233 lbs   07/23/2023 Weight: 232-233 lbs   09/24/2023 Weight: 233 lbs                 Spoke with patient and heart failure questions reviewed.  Transmission results reviewed.  Pt asymptomatic for fluid accumulation but took PRN Lasix  due to eating habits when his sister passed away in the last week.    CorVue thoracic impedance suggesting possible fluid accumulation starting 7/11-7/20.     Prescribed:  Furosemide  20 mg take 1 tablet by mouth PRN.  Can take if > 3 lbs weight gain in 24 hours or > 5 lbs in a week or developing of leg swelling Spironolactone  25 mg take 1 tablet daily   Labs: 05/21/2023 Creatinine 1.18, BUN 16, Potassium 4.1, Sodium 136, >60 A complete set of results can be found in Results Review.   Recommendations:  ENo changes and encouraged to call if experiencing any fluid symptoms.   Follow-up plan: ICM clinic phone appointment on 10/29/2023.   91 day device clinic remote transmission 10/28/2023.     EP/Cardiology Office Visits:  11/06/2023 with Dr Gardenia.      Copy of ICM check sent to Dr. Inocencio.   3 month ICM trend: 09/22/2023.    12-14 Month ICM trend:     Mitzie GORMAN Garner, RN 09/24/2023 3:08 PM

## 2023-09-30 ENCOUNTER — Ambulatory Visit (HOSPITAL_COMMUNITY)
Admission: RE | Admit: 2023-09-30 | Discharge: 2023-09-30 | Discharge status: Home / Self Care | Source: Ambulatory Visit | Attending: Cardiology

## 2023-09-30 DIAGNOSIS — I5020 Unspecified systolic (congestive) heart failure: Secondary | ICD-10-CM | POA: Diagnosis not present

## 2023-09-30 DIAGNOSIS — I358 Other nonrheumatic aortic valve disorders: Secondary | ICD-10-CM | POA: Diagnosis not present

## 2023-09-30 DIAGNOSIS — I110 Hypertensive heart disease with heart failure: Secondary | ICD-10-CM | POA: Insufficient documentation

## 2023-09-30 LAB — ECHOCARDIOGRAM COMPLETE
Area-P 1/2: 3.21 cm2
Calc EF: 56.3 %
S' Lateral: 4.1 cm
Single Plane A2C EF: 57.3 %
Single Plane A4C EF: 55.5 %

## 2023-10-28 ENCOUNTER — Ambulatory Visit (INDEPENDENT_AMBULATORY_CARE_PROVIDER_SITE_OTHER): Payer: Self-pay

## 2023-10-28 DIAGNOSIS — I255 Ischemic cardiomyopathy: Secondary | ICD-10-CM

## 2023-10-29 ENCOUNTER — Ambulatory Visit: Attending: Cardiology

## 2023-10-29 DIAGNOSIS — Z95810 Presence of automatic (implantable) cardiac defibrillator: Secondary | ICD-10-CM

## 2023-10-29 DIAGNOSIS — I5022 Chronic systolic (congestive) heart failure: Secondary | ICD-10-CM | POA: Diagnosis not present

## 2023-10-30 NOTE — Progress Notes (Signed)
 EPIC Encounter for ICM Monitoring  Patient Name: [name] [name] is a 63 y.o. male Date: 10/30/2023 Primary Care Physican: [facility] Primary Cardiologist: [name]/Sabharwal  Electrophysiologist: [name] 12/24/2022 Weight: 240 lbs    01/29/2024 Weight: 238 lbs  03/07/2023 Weight: 238 lbs   04/10/2023 Weight: 238 lbs  05/13/2023 Weight: 233 lbs   07/23/2023 Weight: 232-233 lbs   09/24/2023 Weight: 233 lbs            10/30/2023 Weight: 234 lbs      Spoke with patient and heart failure questions reviewed.  Transmission results reviewed.  Pt asymptomatic for fluid accumulation.  He has taken 1 PRN Lasix  in the last month due to small weight gain.    CorVue thoracic impedance suggesting possible fluid accumulation starting 8/13-8/17.     Prescribed:  Furosemide  20 mg take 1 tablet by mouth PRN.  Can take if > 3 lbs weight gain in 24 hours or > 5 lbs in a week or developing of leg swelling Spironolactone  25 mg take 1 tablet daily   Labs: 08/21/2023 Creatinine 0.90, BUN 16, Potassium 4.2, Sodium 137, >60 05/21/2023 Creatinine 1.18, BUN 16, Potassium 4.1, Sodium 136, >60 A complete set of results can be found in Results Review.   Recommendations:  No changes and encouraged to call if experiencing any fluid symptoms.   Follow-up plan: ICM clinic phone appointment on 12/01/2023.   91 day device clinic remote transmission 01/27/2024.     EP/Cardiology Office Visits:  11/06/2023 with Dr Gardenia.      Copy of ICM check sent to Dr. Inocencio.   3 month ICM trend: 10/29/2023.    12-14 Month ICM trend:     Mitzie GORMAN Garner, RN 10/30/2023 4:04 PM

## 2023-11-05 NOTE — Progress Notes (Incomplete)
 ADVANCED HEART FAILURE CLINIC NOTE  Referring Physician: [facility]  Primary Care: [facility] Primary Cardiologist: Ozell Fell, MD HF: Ria Commander, DO  CC: Systolic Heart Failure  HPI: KERRY CHISOLM is a 63 y.o. male with systolic heart failure secondary to ischemic cardiomyopathy (EF 25 to 30%), CAD status post anterior MI in 2008 status post BMS to the LAD, history of VT with ICD in place presenting today for follow up.  Over the past year Mr. Yom has had multiple admissions.  He was admitted in January 2024 for VT status post ICD shock.  Left heart cath with CTO of the mid to distal LAD filled with left to left collaterals.  He was admitted again in September 2024 with recurrent VT requiring external defibrillation's and was loaded with amiodarone .  Left heart cath at that time was unchanged.  Echocardiogram with LVEF of 25 to 30%.  Since that time he has been seen in [facility] clinic where he reported no further episodes of ventricular tachycardia and reported being able to do most ADLs without significant limitations.  At that time GDMT was further uptitrated and he was referred to advanced heart failure due to concern that he may require advanced therapies.  Interval hx:  - Doing very well from a symptomatic standpoint. Compliant with all medications. No recent hospitalizations. Overall NYHA IIB functional status.   Current Outpatient Medications  Medication Sig Dispense Refill   acetaminophen  (TYLENOL ) 500 MG tablet Take 1,000 mg by mouth every 8 (eight) hours as needed for moderate pain.     amiodarone  (PACERONE ) 100 MG tablet Take 1 tablet (100 mg total) by mouth daily. 90 tablet 3   ARIPiprazole  (ABILIFY ) 10 MG tablet Take 10 mg by mouth daily as needed (for ptsd).     aspirin  EC 81 MG tablet Take 1 tablet (81 mg total) by mouth daily.     aspirin -sod bicarb-citric acid (ALKA-SELTZER) 325 MG TBEF tablet Take 650 mg by mouth every 6  (six) hours as needed (indigestion).     bisacodyl (DULCOLAX) 5 MG EC tablet Take by mouth.     carvedilol  (COREG ) 25 MG tablet Take 1 tablet (25 mg total) by mouth 2 (two) times daily with a meal. 60 tablet 0   cholecalciferol (VITAMIN D) 1000 UNITS tablet Take 1,000 Units by mouth daily.     ciclopirox (PENLAC) 8 % solution Apply 1 application  topically daily. APPLY DAILY TO FUNGAL TOE NAILS REMOVE WITH NAIL POLISH REMOVER ONCE WEEKLY APPLY DAILY TO FUNGAL TOE NAILS REMOVE WITH NAIL POLISH REMOVER ONCE WEEKLY FUNGAL TOE NAILS     diclofenac Sodium (VOLTAREN) 1 % GEL Apply 2 g topically 4 (four) times daily as needed (shoulder pain).     fish oil-omega-3 fatty acids 1000 MG capsule Take 1 g by mouth daily.      fluticasone  (FLONASE ) 50 MCG/ACT nasal spray Place 2 sprays into both nostrils as needed for allergies or rhinitis.     furosemide  (LASIX ) 20 MG tablet Patient can take 20 mg PRN. Can take if > 3 lbs weight gain in 24 hours or > 5 lbs in 1 week or developing of leg swelling. 45 tablet 3   isosorbide-hydrALAZINE  (BIDIL) 20-37.5 MG tablet Take 1 tablet by mouth 3 (three) times daily.     lidocaine  (LIDODERM ) 5 % Place 1 patch onto the skin daily as needed (pain). Remove & Discard patch within 12 hours or as directed by MD  loratadine  (CLARITIN ) 10 MG tablet Take 10 mg by mouth daily as needed for allergies.     Multiple Vitamin (MULTIVITAMIN) tablet Take 1 tablet by mouth daily.       nitroGLYCERIN  (NITROSTAT ) 0.4 MG SL tablet Place 1 tablet (0.4 mg total) under the tongue every 5 (five) minutes x 3 doses as needed for chest pain. 25 tablet 1   omeprazole (PRILOSEC) 20 MG capsule Take 20 mg by mouth daily as needed (gerd/reflux).     PARoxetine  (PAXIL ) 40 MG tablet Take 40 mg by mouth daily as needed (for PTSD).     sacubitril -valsartan  (ENTRESTO ) 97-103 MG Take 1 tablet by mouth 2 (two) times daily. 180 tablet 2   simvastatin  (ZOCOR ) 80 MG tablet Take 40 mg by mouth at bedtime.      spironolactone  (ALDACTONE ) 25 MG tablet Take 1 tablet (25 mg total) by mouth daily. 90 tablet 3   Tetrahydrozoline HCl (VISINE OP) Place 1 drop into both eyes daily as needed (irritation).     vitamin C (ASCORBIC ACID) 500 MG tablet Take 500 mg by mouth daily.     No current facility-administered medications for this visit.    No Known Allergies  PHYSICAL EXAM: There were no vitals filed for this visit. GENERAL: NAD Lungs- *** CARDIAC:  JVP: *** cm          Normal rate with regular rhythm. *** murmur.  Pulses ***. *** edema.  ABDOMEN: Soft, non-tender, non-distended.  EXTREMITIES: Warm and well perfused.  NEUROLOGIC: No obvious FND    DATA REVIEW  ECG: 01/13/23: sinus bradycardia  As per my personal interpretation  ECHO: 12/02/22: LVEF 25%-30% As per my personal interpretation  CATH: LHC 12/02/22   Mid LAD to Dist LAD lesion is 100% stenosed.   Right dominant coronary circulation.  Single vessel CAD with CTO of the mid to distal LAD throughout previously stented areas.  Chronic calcification of the LV apex likely secondary to old anterior MI.  LVEDP mildly elevated at 20.  No significant changes from LHC done in 04/01/22.    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 NYHA class / AHA Stage:IIB;  Volume status & Diuretics: Euvolemic Vasodilators: Entresto  97/103 mg twice daily; Bidil  1 tab TID Beta-Blocker: continue coreg  25mg  BID.  MRA: Spironolactone  25 mg daily Cardiometabolic: Holding due to frequent GU infections Devices therapies & Valvulopathies: ICD in place Advanced therapies: significant improvement with addition of Bidil and GDMT. At this time no indication for advanced therapies or CPET testing. Will continue to follow 1-2x yearly. Plan on repeat TTE prior to next follow up to ensure stable LV  function.   2.  Coronary artery disease -Anterior MI in 2008 with PCI to the LAD.  Repeat left heart cath on 1/24 and 9/24 with CTO of the mid to distal LAD followed by left to left collaterals. -Followed by Dr. Wonda.  Continue aspirin , statin and beta-blocker. -Decrease ASA to 81mg  daily -No chest pain.   3.  Ventricular tachycardia -X 2 hospitalizations this year for recurrent VT. -Followed by EP.  Currently on amiodarone  200 mg daily. -TSH/T4 from 07/10/23: 0.224, Free T4 elevated at 1.66.  - Repeat T4 today down to 1.11.  TSH remains suppressed at 0.139.  -Will discuss with EP.   4. HTN - well controlled.   I spent *** minutes  caring for this patient today including face to face time, ordering and reviewing labs, reviewing records from ***, seeing the patient, documenting in the record, and arranging follow ups.    Rajan Burgard Advanced Heart Failure Mechanical Circulatory Support

## 2023-11-06 ENCOUNTER — Encounter (HOSPITAL_COMMUNITY): Payer: Self-pay

## 2023-11-06 ENCOUNTER — Encounter (HOSPITAL_COMMUNITY): Admitting: Cardiology

## 2023-11-06 LAB — CUP PACEART REMOTE DEVICE CHECK
Battery Remaining Longevity: 93 mo
Battery Remaining Percentage: 76 %
Battery Voltage: 3.01 V
Brady Statistic RV Percent Paced: 1 %
Date Time Interrogation Session: 20250827021112
HighPow Impedance: 70 Ohm
Implantable Lead Connection Status: 753985
Implantable Lead Implant Date: 20120605
Implantable Lead Location: 753860
Implantable Pulse Generator Implant Date: 20230224
Lead Channel Impedance Value: 410 Ohm
Lead Channel Pacing Threshold Amplitude: 1 V
Lead Channel Pacing Threshold Pulse Width: 0.5 ms
Lead Channel Sensing Intrinsic Amplitude: 11.8 mV
Lead Channel Setting Pacing Amplitude: 2.5 V
Lead Channel Setting Pacing Pulse Width: 0.5 ms
Lead Channel Setting Sensing Sensitivity: 0.5 mV
Pulse Gen Serial Number: 111056600
Zone Setting Status: 755011

## 2023-11-09 ENCOUNTER — Ambulatory Visit: Payer: Self-pay | Admitting: Cardiology

## 2023-11-10 ENCOUNTER — Telehealth (HOSPITAL_COMMUNITY): Payer: Self-pay

## 2023-11-10 NOTE — Progress Notes (Signed)
 ADVANCED HEART FAILURE CLINIC NOTE  Referring Physician: [facility]  [facility] Care: [facility] Primary Cardiologist: Ozell Fell, MD HF: Ria Commander, DO  CC: Systolic Heart Failure  HPI: [name] [name] is a 63 y.o. male with systolic heart failure secondary to ischemic cardiomyopathy (EF 25 to 30%), CAD status post anterior MI in 2008 status post BMS to the LAD, history of VT with ICD in place presenting today for follow up.  Over the past year Mr. Lovvorn has had multiple admissions.  He was admitted in January 2024 for VT status post ICD shock.  Left heart cath with CTO of the mid to distal LAD filled with left to left collaterals.  He was admitted again in September 2024 with recurrent VT requiring external defibrillation's and was loaded with amiodarone .  Left heart cath at that time was unchanged.  Echocardiogram with LVEF of 25 to 30%.  Since that time he has been seen in [facility] clinic where he reported no further episodes of ventricular tachycardia and reported being able to do most ADLs without significant limitations.  At that time GDMT was further uptitrated and he was referred to advanced heart failure due to concern that he may require advanced therapies.  Interval hx:  - Doing very well from a symptomatic standpoint. Compliant with all medications. No recent hospitalizations. Overall NYHA IIB functional status.   Current Outpatient Medications  Medication Sig Dispense Refill   acetaminophen  (TYLENOL ) 500 MG tablet Take 1,000 mg by mouth every 8 (eight) hours as needed for moderate pain.     amiodarone  (PACERONE ) 100 MG tablet Take 1 tablet (100 mg total) by mouth daily. 90 tablet 3   ARIPiprazole  (ABILIFY ) 10 MG tablet Take 10 mg by mouth daily as needed (for ptsd).     aspirin  EC 81 MG tablet Take 1 tablet (81 mg total) by mouth daily.     aspirin -sod bicarb-citric acid (ALKA-SELTZER) 325 MG TBEF tablet Take 650 mg by mouth every 6  (six) hours as needed (indigestion).     bisacodyl (DULCOLAX) 5 MG EC tablet Take by mouth.     carvedilol  (COREG ) 25 MG tablet Take 1 tablet (25 mg total) by mouth 2 (two) times daily with a meal. 60 tablet 0   cholecalciferol (VITAMIN D) 1000 UNITS tablet Take 1,000 Units by mouth daily.     ciclopirox (PENLAC) 8 % solution Apply 1 application  topically daily. APPLY DAILY TO FUNGAL TOE NAILS REMOVE WITH NAIL POLISH REMOVER ONCE WEEKLY APPLY DAILY TO FUNGAL TOE NAILS REMOVE WITH NAIL POLISH REMOVER ONCE WEEKLY FUNGAL TOE NAILS     diclofenac Sodium (VOLTAREN) 1 % GEL Apply 2 g topically 4 (four) times daily as needed (shoulder pain).     fish oil-omega-3 fatty acids 1000 MG capsule Take 1 g by mouth daily.      fluticasone  (FLONASE ) 50 MCG/ACT nasal spray Place 2 sprays into both nostrils as needed for allergies or rhinitis.     furosemide  (LASIX ) 20 MG tablet Patient can take 20 mg PRN. Can take if > 3 lbs weight gain in 24 hours or > 5 lbs in 1 week or developing of leg swelling. 45 tablet 3   isosorbide-hydrALAZINE  (BIDIL) 20-37.5 MG tablet Take 1 tablet by mouth 3 (three) times daily.     lidocaine  (LIDODERM ) 5 % Place 1 patch onto the skin daily as needed (pain). Remove & Discard patch within 12 hours or as directed by MD  loratadine  (CLARITIN ) 10 MG tablet Take 10 mg by mouth daily as needed for allergies.     Multiple Vitamin (MULTIVITAMIN) tablet Take 1 tablet by mouth daily.       nitroGLYCERIN  (NITROSTAT ) 0.4 MG SL tablet Place 1 tablet (0.4 mg total) under the tongue every 5 (five) minutes x 3 doses as needed for chest pain. 25 tablet 1   omeprazole (PRILOSEC) 20 MG capsule Take 20 mg by mouth daily as needed (gerd/reflux).     PARoxetine  (PAXIL ) 40 MG tablet Take 40 mg by mouth daily as needed (for PTSD).     sacubitril -valsartan  (ENTRESTO ) 97-103 MG Take 1 tablet by mouth 2 (two) times daily. 180 tablet 2   simvastatin  (ZOCOR ) 80 MG tablet Take 40 mg by mouth at bedtime.      spironolactone  (ALDACTONE ) 25 MG tablet Take 1 tablet (25 mg total) by mouth daily. 90 tablet 3   Tetrahydrozoline HCl (VISINE OP) Place 1 drop into both eyes daily as needed (irritation).     vitamin C (ASCORBIC ACID) 500 MG tablet Take 500 mg by mouth daily.     No current facility-administered medications for this visit.    No Known Allergies  PHYSICAL EXAM: There were no vitals filed for this visit.  GENERAL: NAD Lungs- CTA CARDIAC:  JVP: 6 cm          Normal rate with regular rhythm. no murmur.  Pulses 2+. no edema.  ABDOMEN: Soft, non-tender, non-distended.  EXTREMITIES: Warm and well perfused.  NEUROLOGIC: No obvious FND   DATA REVIEW  ECG: 01/13/23: sinus bradycardia  As per my personal interpretation  ECHO: 12/02/22: LVEF 25%-30% As per my personal interpretation  CATH: LHC 12/02/22   Mid LAD to Dist LAD lesion is 100% stenosed.   Right dominant coronary circulation.  Single vessel CAD with CTO of the mid to distal LAD throughout previously stented areas.  Chronic calcification of the LV apex likely secondary to old anterior MI.  LVEDP mildly elevated at 20.  No significant changes from LHC done in 04/01/22.    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 NYHA class / AHA Stage:IIB;  Volume status & Diuretics: Euvolemic Vasodilators: Entresto  97/103 mg twice daily; Bidil  1 tab TID Beta-Blocker: continue coreg  25mg  BID.  MRA: Spironolactone  25 mg daily Cardiometabolic: Holding due to frequent GU infections Devices therapies & Valvulopathies: ICD in place Advanced therapies: significant improvement with addition of Bidil and GDMT. At this time no indication for advanced therapies or CPET testing. Will continue to follow 1-2x yearly. Plan on repeat TTE prior to next follow up to ensure stable LV  function.   2.  Coronary artery disease -Anterior MI in 2008 with PCI to the LAD.  Repeat left heart cath on 1/24 and 9/24 with CTO of the mid to distal LAD followed by left to left collaterals. -Followed by Dr. Wonda.  Continue aspirin , statin and beta-blocker. -Decrease ASA to 81mg  daily -No chest pain.   3.  Ventricular tachycardia -X 2 hospitalizations this year for recurrent VT. -Followed by EP.  Currently on amiodarone  200 mg daily. -TSH/T4 from 07/10/23: 0.224, Free T4 elevated at 1.66.  - Repeat T4 today down to 1.11.  TSH remains suppressed at 0.139.  -Will discuss with EP.   4. HTN - well controlled.   I spent 35 minutes  caring for this patient today including face to face time, ordering and reviewing labs, reviewing echo with him, discussing importance of med compliance, seeing the patient, documenting in the record, and arranging follow ups.    Aditya Sabharwal Advanced Heart Failure Mechanical Circulatory Support

## 2023-11-10 NOTE — Progress Notes (Signed)
Remote ICD Transmission.

## 2023-11-10 NOTE — Telephone Encounter (Signed)
 Called to confirm/remind patient of their appointment at the Advanced [facility] Failure Clinic on 11/11/18.   Appointment:   [x] Confirmed  [] Left mess   [] No answer/No voice mail  [] VM Full/unable to leave message  [] Phone not in service  Patient reminded to bring all medications and/or complete list.  Confirmed patient has transportation. Gave directions, instructed to utilize valet parking.

## 2023-11-11 ENCOUNTER — Encounter (HOSPITAL_COMMUNITY): Payer: Self-pay

## 2023-11-11 ENCOUNTER — Ambulatory Visit (HOSPITAL_COMMUNITY)
Admission: RE | Admit: 2023-11-11 | Discharge: 2023-11-11 | Disposition: A | Discharge status: Home / Self Care | Source: Ambulatory Visit | Attending: Family Medicine | Admitting: Family Medicine

## 2023-11-11 VITALS — BP 128/72 | HR 63 | Ht 70.0 in | Wt 239.6 lb

## 2023-11-11 DIAGNOSIS — I2510 Atherosclerotic heart disease of native coronary artery without angina pectoris: Secondary | ICD-10-CM | POA: Diagnosis not present

## 2023-11-11 DIAGNOSIS — T462X5A Adverse effect of other antidysrhythmic drugs, initial encounter: Secondary | ICD-10-CM

## 2023-11-11 DIAGNOSIS — I110 Hypertensive heart disease with heart failure: Secondary | ICD-10-CM | POA: Diagnosis not present

## 2023-11-11 DIAGNOSIS — I252 Old myocardial infarction: Secondary | ICD-10-CM | POA: Insufficient documentation

## 2023-11-11 DIAGNOSIS — Z7982 Long term (current) use of aspirin: Secondary | ICD-10-CM | POA: Diagnosis not present

## 2023-11-11 DIAGNOSIS — I5022 Chronic systolic (congestive) heart failure: Secondary | ICD-10-CM

## 2023-11-11 DIAGNOSIS — I472 Ventricular tachycardia, unspecified: Secondary | ICD-10-CM

## 2023-11-11 DIAGNOSIS — Z79899 Other long term (current) drug therapy: Secondary | ICD-10-CM | POA: Insufficient documentation

## 2023-11-11 DIAGNOSIS — E0580 Other thyrotoxicosis without thyrotoxic crisis or storm: Secondary | ICD-10-CM

## 2023-11-11 DIAGNOSIS — I255 Ischemic cardiomyopathy: Secondary | ICD-10-CM | POA: Diagnosis not present

## 2023-11-11 DIAGNOSIS — I10 Essential (primary) hypertension: Secondary | ICD-10-CM

## 2023-11-11 DIAGNOSIS — Z95810 Presence of automatic (implantable) cardiac defibrillator: Secondary | ICD-10-CM | POA: Diagnosis not present

## 2023-11-11 DIAGNOSIS — Z955 Presence of coronary angioplasty implant and graft: Secondary | ICD-10-CM | POA: Diagnosis not present

## 2023-11-11 LAB — COMPREHENSIVE METABOLIC PANEL WITH GFR
ALT: 23 U/L (ref 0–44)
AST: 22 U/L (ref 15–41)
Albumin: 3.9 g/dL (ref 3.5–5.0)
Alkaline Phosphatase: 44 U/L (ref 38–126)
Anion gap: 13 (ref 5–15)
BUN: 16 mg/dL (ref 8–23)
CO2: 21 mmol/L — ABNORMAL LOW (ref 22–32)
Calcium: 9.2 mg/dL (ref 8.9–10.3)
Chloride: 102 mmol/L (ref 98–111)
Creatinine, Ser: 1.05 mg/dL (ref 0.61–1.24)
GFR, Estimated: >60 mL/min (ref >60)
Glucose, Bld: 98 mg/dL (ref 70–99)
Potassium: 3.8 mmol/L (ref 3.5–5.1)
Sodium: 136 mmol/L (ref 135–145)
Total Bilirubin: 0.5 mg/dL (ref 0.0–1.2)
Total Protein: 7.1 g/dL (ref 6.5–8.1)

## 2023-11-11 LAB — TSH: TSH: <0.1 u[IU]/mL — ABNORMAL LOW (ref 0.350–4.500)

## 2023-11-11 LAB — T4, FREE: Free T4: 1.58 ng/dL — ABNORMAL HIGH (ref 0.61–1.12)

## 2023-11-11 NOTE — Patient Instructions (Signed)
 Medication Changes:  NO CHANGES   Lab Work:  Labs done today, your results will be available in MyChart, we will contact you for abnormal readings.   Follow-Up in: 6 MONTHS WITH DR. GARDENIA. GIVE OUR OFFICE A CALL IN FEBRUARY TO SCHEDULE APPOINTMENT.    At the Advanced Heart Failure Clinic, you and your health needs are our priority. We have a designated team specialized in the treatment of Heart Failure. This Care Team includes your primary Heart Failure Specialized Cardiologist (physician), Advanced Practice Providers (APPs- Physician Assistants and Nurse Practitioners), and Pharmacist who all work together to provide you with the care you need, when you need it.   You may see any of the following providers on your designated Care Team at your next follow up:  Dr. Toribio Fuel Dr. Ezra Shuck Dr. Ria GARDENIA Dr. Odis Brownie Greig Mosses, NP Caffie Shed, [place] [facility], [place] Beckey Coe, NP [place] Lee, NP Tinnie Redman, PharmD   Please be sure to bring in all your medications bottles to every appointment.   Need to Contact Us :  If you have any questions or concerns before your next appointment please send us  a message through Hope or call our office at [phone].    TO LEAVE A MESSAGE FOR THE NURSE SELECT OPTION 2, PLEASE LEAVE A MESSAGE INCLUDING: YOUR NAME DATE OF BIRTH CALL BACK NUMBER REASON FOR CALL**this is important as we prioritize the call backs  YOU WILL RECEIVE A CALL BACK THE SAME DAY AS LONG AS YOU CALL BEFORE 4:00 PM

## 2023-11-12 ENCOUNTER — Ambulatory Visit (HOSPITAL_COMMUNITY): Payer: Self-pay | Admitting: Family Medicine

## 2023-11-12 DIAGNOSIS — E0590 Thyrotoxicosis, unspecified without thyrotoxic crisis or storm: Secondary | ICD-10-CM

## 2023-11-12 NOTE — Telephone Encounter (Signed)
 Pt aware and voiced understanding

## 2023-11-19 ENCOUNTER — Telehealth: Payer: Self-pay | Admitting: *Deleted

## 2023-11-19 MED ORDER — MEXILETINE HCL 150 MG PO CAPS
150.0000 mg | ORAL_CAPSULE | Freq: Two times a day (BID) | ORAL | 0 refills | Status: DC
Start: 2023-11-19 — End: 2023-12-16

## 2023-11-19 NOTE — Telephone Encounter (Signed)
 Spoke with patient aware of appointment needed ( schedulers contacted)  and  medication changes of stopping Amiodarone  and starting Melexitine 150 mg twice a day. Patient stated that due to not being  sure about the cost, send to local pharmacy 30 day supply. Then if to expensive will go [place] for coverage. Patient stated he will stay in contact with clinic so that he can make sure he has enough pills

## 2023-12-01 ENCOUNTER — Ambulatory Visit: Attending: Cardiology

## 2023-12-01 DIAGNOSIS — Z95810 Presence of automatic (implantable) cardiac defibrillator: Secondary | ICD-10-CM | POA: Diagnosis not present

## 2023-12-01 DIAGNOSIS — I5022 Chronic systolic (congestive) heart failure: Secondary | ICD-10-CM

## 2023-12-04 NOTE — Progress Notes (Signed)
 EPIC Encounter for ICM Monitoring  Patient Name: [name] [name] is a 63 y.o. male Date: 12/04/2023 Primary Care Physican: [facility] Primary Cardiologist: [name]/Sabharwal  Electrophysiologist: [name] 12/24/2022 Weight: 240 lbs    01/29/2024 Weight: 238 lbs  03/07/2023 Weight: 238 lbs   04/10/2023 Weight: 238 lbs  05/13/2023 Weight: 233 lbs   07/23/2023 Weight: 232-233 lbs   09/24/2023 Weight: 233 lbs            10/30/2023 Weight: 234 lbs    12/04/2023 Weight:  238 lbs   Spoke with patient and heart failure questions reviewed.  Transmission results reviewed.  Pt asymptomatic for fluid accumulation.  Reports feeling well at this time and voices no complaints.     Since 10/29/2023 ICM Remote Transmission: Cor[employer] thoracic impedance suggesting normal fluid levels with the exception of possible fluid accumulation starting 11/13/2023 - 11/22/2023 and possible dryness from 11/25/2023 - 11/29/2023.     Prescribed:  Furosemide  20 mg take 1 tablet by mouth PRN.  Can take if > 3 lbs weight gain in 24 hours or > 5 lbs in a week or developing of leg swelling Spironolactone  25 mg take 1 tablet daily   Labs: 08/21/2023 Creatinine 0.90, BUN 16, Potassium 4.2, Sodium 137, >60 05/21/2023 Creatinine 1.18, BUN 16, Potassium 4.1, Sodium 136, >60 A complete set of results can be found in Results Review.   Recommendations:  No changes and encouraged to call if experiencing any fluid symptoms.   Follow-up plan: ICM clinic phone appointment on 01/12/2024.   91 day device clinic remote transmission 01/27/2024.     EP/Cardiology Office Visits:    12/15/2023 with Charlies Arthur, PA.   Recall 05/09/2024 with Dr Gardenia.      Copy of ICM check sent to Dr. Inocencio.   Remote monitoring is medically necessary for Heart Failure Management.    90 day Daily Thoracic Impedance ICM trend: 09/02/2023 through 12/01/2023.    12-14 Month Thoracic Impedance ICM trend:     [name] GORMAN Garner, RN 12/04/2023 8:20  AM

## 2023-12-13 ENCOUNTER — Other Ambulatory Visit: Payer: Self-pay | Admitting: Physician Assistant

## 2023-12-14 NOTE — Progress Notes (Unsigned)
 Cardiology Office Note:  .   Date:  12/15/2023  ID:  [name], DOB [birth year], MRN [id number] PCP: [facility]  [facility] Providers Cardiologist:  Ozell Fell, MD Cardiology APP:  Lelon Glendia DASEN, PA-C  Electrophysiologist:  [name], MD  Advanced Heart Failure:  Ria Commander, DO {  History of Present Illness: [name]   YEHUDAH [name] is a 63 y.o. male w/PMHx of CAD (MI 2008 tx w/BMS to LAD), ICM, chronic CHF, VT  Admitted Jan 2024 w/VT > LHC showing single-vessel coronary artery disease with total occlusion of the mid to distal LAD within the previously implanted stents, left to left collaterals present supplying the apical LAD, widely patent left main, left circumflex, ramus intermedius, and RCA, with minimal irregularity but no significant stenose. CAD managed medically.   Sept 2024, admitted with VT (required external defibrillation) started on amiodarone  LHC done and unchanged LV apex also noted to be chronically calcified, likely 2/2 old MI  VT was slower then detection rate and required external CV Was hemodynamically tolerated but very symptomatic Discussed plan to re-program VT zones with lower monitoring zone and lower therapy zone with more aggressive ATP to treat further similar arrhythmias   VT Zone 1 150 monitor  VT Zone 2 171 ATP x8 at 85%, then ATP x8 at 82%, then shock x2  VF Unchanged at 214  Saw HF team, 12/18/22, not able to be on SGLT2i with GU infections, pending labs for addition of lasix , suspect trajectory leading to advanced therapies  I saw him Nov 2024 He is doing well Weight is stable Excellent medication compliance Denies bloating, edema, volume OL No CP, palpitations No symptoms like that of his VT No shocks No syncope or syncope No VT No changes made Volume stable  Seeing Dr Nance a few times since as well as Drs. [name] and [name]  Dr. Norton 02/28/23, no VT, amio continued  Dr.  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 He is tolerating amiodarone  without side effect. He has mild exertional dyspnea which is unchanged over time. Discussed amio survellience, no changes made  Dr. Nance yesterday 08/21/23, doing well, compliant with his meds,class IIB symptoms TSH /T4 from 07/10/23: 0.224, Free T4 elevated at 1.66.  - Repeat T4 today down to 1.11.  TSH remains suppressed at 0.139.  Planned to d/w EP  I saw him 08/22/23 He is a bit concerned about his thyroid , mentions not a lot of confidence in his [facility] PM[facility]'s in managing it He feels well though.  No CP, palpitations or cardiac awareness No tremor, tremulousness, symptoms of hyperthyroid. No SOB, minimal DOE No near syncope or syncope No shocks With no VT Amiodarone  dose reduced to 100mg  Given a copy of his labs > to bring to his PMD, advised management of thyroid  as PMD felt indicated.  Saw the HF team [phone] Sob with inclines, otherwise good exertional capacity Labs updated  Thyroid  labs continued abnormal Referred to endo Amio stopped > mexiletine started  Today's visit is scheduled for 89mo device visit ROS:  He feels fine! Denies any CP, palpitations or cardiac awareness No SOB Stays busy, likes to work on cars. Doesn't like to sit around No near syncope or syncope No shocks  No symptoms of hyperthyroid elicted He has not been called by endo team/office yet for an appt  Tolerating mexiletine  Reports his BP was getting low > self  reduced his Bidil from TID > BID  Device information Abbott single chamber ICD implanted 04/27/21  ++ appropriate therapies  Arrhythmia/AAD hx Amiodarone  started Sept 2024 >> stopped Sept 2025 2/2 hyperthyroid Mexiletine started Sept 2025  Studies Reviewed: [name]    EKG not done today  Brief DEVICE interrogation done today to evaluate  rhyth/arrhythmi burdenand reviewed by myself Battery and auto lead measurements are good No VT CorVue looks good   LHC 12/02/22   Mid LAD to Dist LAD lesion is 100% stenosed. Right dominant coronary circulation.  Single vessel CAD with CTO of the mid to distal LAD throughout previously stented areas.  Chronic calcification of the LV apex likely secondary to old anterior MI.  LVEDP mildly elevated at 20.  No significant changes from LHC done in 04/01/22.    2D Echo 12/02/22 1. Severely reduced CI by LVOT VTI (0.9L/min/m2). Left ventricular  ejection fraction, by estimation, is 25 to 30%. The left ventricle has  severely decreased function. Left ventricular endocardial border not  optimally defined to evaluate regional wall  motion but the mid-distal anterior walls, the apex, and mid-distal  anterolateral wall appear akinetic. The left ventricular internal cavity  size was severely dilated. There is moderate eccentric left ventricular  hypertrophy. Left ventricular diastolic  parameters are consistent with Grade II diastolic dysfunction  (pseudonormalization).   2. Right ventricular systolic function was not well visualized. The right  ventricular size is not well visualized.   3. Left atrial size was moderately dilated.   4. The mitral valve is grossly normal. Trivial mitral valve  regurgitation. No evidence of mitral stenosis.   5. The aortic valve is tricuspid. Aortic valve regurgitation is not  visualized. No aortic stenosis is present.      Risk Assessment/Calculations:    Physical Exam:   VS:  BP 112/62 (BP Location: Right Arm, Patient Position: Sitting, Cuff Size: Large)   Pulse 70   Ht 5' 10 (1.778 m)   Wt 240 lb (108.9 kg)   SpO2 94%   BMI 34.44 kg/m    Wt Readings from Last 3 Encounters:  12/15/23 240 lb (108.9 kg)  11/11/23 239 lb 9.6 oz (108.7 kg)  08/22/23 239 lb (108.4 kg)    GEN: Well nourished, well developed in no acute distress NECK: No JVD; No carotid  bruits CARDIAC:  RRR, no murmurs, rubs, gallops RESPIRATORY: CTA b/l without rales, wheezing or rhonchi  ABDOMEN: Soft, non-tender, non-distended EXTREMITIES: No edema; No deformity    ICD site: is stable, no thinning, fluctuation, tethering  ASSESSMENT AND PLAN: .    ICD Intact function No programming changes made   VT Off amiodarone  > tolerating mexiletine No VT  Hyperthyroid likely 2/2 his amio Off amio ~ 57mo No symptoms of hyperthyroid Will update lab today  ICM Chronic CHF CorVue looks good, no symptoms, exam findings of volume OL C/w Dr. Johnnye  CAD No anginal symptoms Caths as above C/w Dr. Matias  My MA will look into his endo referral, try to facilitae getting him an appt He was asked if not called to be seen in the next week or so, to let us /AHF team know  Dispo:  remotes as usual, otherwise back with EP in a 42mo, sooner if needed  Signed, Charlies Macario Arthur, PA-C

## 2023-12-15 ENCOUNTER — Ambulatory Visit: Payer: Self-pay | Attending: Physician Assistant | Admitting: Physician Assistant

## 2023-12-15 ENCOUNTER — Ambulatory Visit: Payer: Self-pay | Admitting: Cardiology

## 2023-12-15 VITALS — BP 112/62 | HR 70 | Ht 70.0 in | Wt 240.0 lb

## 2023-12-15 DIAGNOSIS — Z79899 Other long term (current) drug therapy: Secondary | ICD-10-CM

## 2023-12-15 DIAGNOSIS — I2510 Atherosclerotic heart disease of native coronary artery without angina pectoris: Secondary | ICD-10-CM

## 2023-12-15 DIAGNOSIS — I472 Ventricular tachycardia, unspecified: Secondary | ICD-10-CM

## 2023-12-15 DIAGNOSIS — Z95810 Presence of automatic (implantable) cardiac defibrillator: Secondary | ICD-10-CM

## 2023-12-15 DIAGNOSIS — I5022 Chronic systolic (congestive) heart failure: Secondary | ICD-10-CM | POA: Diagnosis not present

## 2023-12-15 LAB — CUP PACEART INCLINIC DEVICE CHECK
Battery Remaining Longevity: 94 mo
Brady Statistic RV Percent Paced: 0.04 %
Date Time Interrogation Session: 20251013104615
HighPow Impedance: 70.875
Implantable Lead Connection Status: 753985
Implantable Lead Implant Date: 20120605
Implantable Lead Location: 753860
Implantable Pulse Generator Implant Date: 20230224
Lead Channel Impedance Value: 462.5 Ohm
Lead Channel Sensing Intrinsic Amplitude: 11.8 mV
Lead Channel Setting Pacing Amplitude: 2.5 V
Lead Channel Setting Pacing Pulse Width: 0.5 ms
Lead Channel Setting Sensing Sensitivity: 0.5 mV
Pulse Gen Serial Number: 111056600
Zone Setting Status: 755011

## 2023-12-15 NOTE — Patient Instructions (Addendum)
 Medication Instructions:   Your physician recommends that you continue on your current medications as directed. Please refer to the Current Medication list given to you today.  *If you need a refill on your cardiac medications before your next appointment, please call your pharmacy*  Lab Work:  PLEASE GO DOWN STAIRS  [facility]  FIRST FLOOR   ( GET OFF ELEVATORS WALK TOWARDS WAITING AREA LAB LOCATED BY PHARMACY):  TSH  FREE T4 FREE T3    If you have labs (blood work) drawn today and your tests are completely normal, you will receive your results only by: MyChart Message (if you have MyChart) OR A paper copy in the mail If you have any lab test that is abnormal or we need to change your treatment, we will call you to review the results.  Testing/Procedures: NONE ORDERED  TODAY    Follow-Up: At [facility], you and your health needs are our priority.  As part of our continuing mission to provide you with exceptional heart care, our providers are all part of one team.  This team includes your primary Cardiologist (physician) and Advanced Practice Providers or APPs (Physician Assistants and Nurse Practitioners) who all work together to provide you with the care you need, when you need it.   Your next appointment:   3 month(s)  Provider:   Soyla Norton, MD or Charlies Arthur, PA-C ( CONTACT  CASSIE HALL/ ANGELINE HAMMER FOR EP SCHEDULING ISSUES )   We recommend signing up for the patient portal called MyChart.  Sign up information is provided on this After Visit Summary.  MyChart is used to connect with patients for Virtual Visits (Telemedicine).  Patients are able to view lab/test results, encounter notes, upcoming appointments, etc.  Non-urgent messages can be sent to your provider as well.   To learn more about what you can do with MyChart, go to [email or web address].   Other Instructions

## 2023-12-16 LAB — T4, FREE: Free T4: 2.23 ng/dL — ABNORMAL HIGH (ref 0.82–1.77)

## 2023-12-16 LAB — TSH: TSH: <0.005 u[IU]/mL — ABNORMAL LOW (ref 0.450–4.500)

## 2023-12-16 LAB — T3, FREE: T3, Free: 4.9 pg/mL — ABNORMAL HIGH (ref 2.0–4.4)

## 2023-12-24 ENCOUNTER — Other Ambulatory Visit: Payer: Self-pay

## 2023-12-24 ENCOUNTER — Emergency Department
Admission: EM | Admit: 2023-12-24 | Discharge: 2023-12-24 | Disposition: A | Discharge status: Home / Self Care | Attending: Emergency Medicine | Admitting: Emergency Medicine

## 2023-12-24 DIAGNOSIS — I509 Heart failure, unspecified: Secondary | ICD-10-CM | POA: Insufficient documentation

## 2023-12-24 DIAGNOSIS — R10A1 Flank pain, right side: Secondary | ICD-10-CM | POA: Diagnosis present

## 2023-12-24 DIAGNOSIS — I2510 Atherosclerotic heart disease of native coronary artery without angina pectoris: Secondary | ICD-10-CM | POA: Diagnosis not present

## 2023-12-24 DIAGNOSIS — I110 Hypertensive heart disease with heart failure: Secondary | ICD-10-CM | POA: Insufficient documentation

## 2023-12-24 LAB — CBC WITH DIFFERENTIAL/PLATELET
Abs Immature Granulocytes: 0.02 K/uL (ref 0.00–0.07)
Basophils Absolute: 0 K/uL (ref 0.0–0.1)
Basophils Relative: 0 %
Eosinophils Absolute: 0.1 K/uL (ref 0.0–0.5)
Eosinophils Relative: 2 %
HCT: 43.9 % (ref 39.0–52.0)
Hemoglobin: 14.3 g/dL (ref 13.0–17.0)
Immature Granulocytes: 0 %
Lymphocytes Relative: 26 %
Lymphs Abs: 2.3 K/uL (ref 0.7–4.0)
MCH: 29.1 pg (ref 26.0–34.0)
MCHC: 32.6 g/dL (ref 30.0–36.0)
MCV: 89.2 fL (ref 80.0–100.0)
Monocytes Absolute: 0.9 K/uL (ref 0.1–1.0)
Monocytes Relative: 10 %
Neutro Abs: 5.5 K/uL (ref 1.7–7.7)
Neutrophils Relative %: 62 %
Platelets: 195 K/uL (ref 150–400)
RBC: 4.92 MIL/uL (ref 4.22–5.81)
RDW: 13.5 % (ref 11.5–15.5)
WBC: 8.9 K/uL (ref 4.0–10.5)
nRBC: 0 % (ref 0.0–0.2)

## 2023-12-24 LAB — URINALYSIS, ROUTINE W REFLEX MICROSCOPIC
Bilirubin Urine: NEGATIVE
Glucose, UA: NEGATIVE mg/dL
Hgb urine dipstick: NEGATIVE
Ketones, ur: NEGATIVE mg/dL
Leukocytes,Ua: NEGATIVE
Nitrite: NEGATIVE
Protein, ur: NEGATIVE mg/dL
Specific Gravity, Urine: 1.024 (ref 1.005–1.030)
pH: 5 (ref 5.0–8.0)

## 2023-12-24 LAB — COMPREHENSIVE METABOLIC PANEL WITH GFR
ALT: 24 U/L (ref 0–44)
AST: 25 U/L (ref 15–41)
Albumin: 4.2 g/dL (ref 3.5–5.0)
Alkaline Phosphatase: 52 U/L (ref 38–126)
Anion gap: 10 (ref 5–15)
BUN: 18 mg/dL (ref 8–23)
CO2: 25 mmol/L (ref 22–32)
Calcium: 8.9 mg/dL (ref 8.9–10.3)
Chloride: 103 mmol/L (ref 98–111)
Creatinine, Ser: 1.1 mg/dL (ref 0.61–1.24)
GFR, Estimated: >60 mL/min (ref >60)
Glucose, Bld: 107 mg/dL — ABNORMAL HIGH (ref 70–99)
Potassium: 4 mmol/L (ref 3.5–5.1)
Sodium: 138 mmol/L (ref 135–145)
Total Bilirubin: 0.4 mg/dL (ref 0.0–1.2)
Total Protein: 7.7 g/dL (ref 6.5–8.1)

## 2023-12-24 LAB — LIPASE, BLOOD: Lipase: 32 U/L (ref 11–51)

## 2023-12-24 MED ORDER — LIDOCAINE 5 % EX PTCH
1.0000 | MEDICATED_PATCH | CUTANEOUS | Status: DC
  Administered 2023-12-24: 1 via TRANSDERMAL
  Filled 2023-12-24: qty 1

## 2023-12-24 MED ORDER — CYCLOBENZAPRINE HCL 10 MG PO TABS
10.0000 mg | ORAL_TABLET | Freq: Once | ORAL | Status: AC
  Administered 2023-12-24: 10 mg via ORAL
  Filled 2023-12-24: qty 1

## 2023-12-24 MED ORDER — CYCLOBENZAPRINE HCL 10 MG PO TABS: 10.0000 mg | ORAL_TABLET | Freq: Three times a day (TID) | ORAL | 0 refills | Status: AC | PRN

## 2023-12-24 MED ORDER — LIDOCAINE 5 % EX PTCH: 1.0000 | MEDICATED_PATCH | CUTANEOUS | 0 refills | Status: AC

## 2023-12-24 NOTE — ED Provider Notes (Signed)
 [facility] Provider Note   Event Date/Time   First MD Initiated Contact with Patient 12/24/23 2056     (approximate) History  Flank Pain  HPI [name] [name] is a 63 y.o. male with a past medical history of CAD, hyperlipidemia, hypertension, and heart failure who presents complaining of right flank pain that starts just inferior to the axilla in the mid axillary line and radiates around to the right upper quadrant.  Patient states this pain has come and gone over the years and normally can use ibuprofen or Tylenol  for few days and will go away on its own.  Patient states that over the last week this pain has been worsening and not improving with over-the-counter medications.  Patient does endorse movement worsening this pain as well as certain positions that improve it. ROS: Patient currently denies any vision changes, tinnitus, difficulty speaking, facial droop, sore throat, chest pain, shortness of breath, abdominal pain, nausea/vomiting/diarrhea, dysuria, or weakness/numbness/paresthesias in any extremity   Physical Exam  Triage Vital Signs: ED Triage Vitals  Encounter Vitals Group     BP 12/24/23 1756 (!) 156/80     Girls Systolic BP Percentile --      Girls Diastolic BP Percentile --      Boys Systolic BP Percentile --      Boys Diastolic BP Percentile --      Pulse Rate 12/24/23 1756 78     Resp 12/24/23 1756 19     Temp 12/24/23 1756 (!) 97.5 F (36.4 C)     Temp Source 12/24/23 1756 Oral     SpO2 --      Weight 12/24/23 1826 240 lb (108.9 kg)     Height 12/24/23 1826 5' 10 (1.778 m)     Head Circumference --      Peak Flow --      Pain Score 12/24/23 1756 5     Pain Loc --      Pain Education --      Exclude from Growth Chart --    Most recent vital signs: Vitals:   12/24/23 1756  BP: (!) 156/80  Pulse: 78  Resp: 19  Temp: (!) 97.5 F (36.4 C)   General: Awake, oriented x4. CV:  Good peripheral perfusion. Resp:  Normal  effort. Abd:  No distention. Other:  Middle-aged obese African-American male resting comfortably in no acute distress.  Mild tenderness to palpation in the left axilla without any palpable masses ED Results / Procedures / Treatments  Labs (all labs ordered are listed, but only abnormal results are displayed) Labs Reviewed  URINALYSIS, ROUTINE W REFLEX MICROSCOPIC - Abnormal; Notable for the following components:      Result Value   Color, Urine YELLOW (*)    APPearance CLEAR (*)    All other components within normal limits  COMPREHENSIVE METABOLIC PANEL WITH GFR - Abnormal; Notable for the following components:   Glucose, Bld 107 (*)    All other components within normal limits  CBC WITH DIFFERENTIAL/PLATELET  LIPASE, BLOOD  PROCEDURES: Critical Care performed: No Procedures MEDICATIONS ORDERED IN ED: Medications  cyclobenzaprine (FLEXERIL) tablet 10 mg (has no administration in time range)  lidocaine  (LIDODERM ) 5 % 1 patch (has no administration in time range)   IMPRESSION / MDM / ASSESSMENT AND PLAN / ED COURSE  I reviewed the triage vital signs and the nursing notes.  The patient is on the cardiac monitor to evaluate for evidence of arrhythmia and/or significant heart rate changes. Patient's presentation is most consistent with acute presentation with potential threat to life or bodily function. Patient is a 63 year old male with the above-stated past medical history presents for right flank pain that is been worsening over the last week DDx: Kidney stone, CHF exacerbation, musculoskeletal pain, PE Plan: CBC, CMP, UA, lipase  Patient shows no signs of tachycardia or hypoxia therefore have low suspicion for CHF exacerbation or pulmonary embolism.  Patient's urinalysis negative for any microscopic or gross hematuria and therefore I have low suspicion for kidney stone.  Patient has reproducible pain in the right flank/inferior axilla that is worse with  movement and therefore concerning for possible musculoskeletal cause for this pain.  Patient will be given muscle relaxant, topical analgesics, and instructions to follow-up with orthopedics if symptoms do not improve.  Patient also given physical therapy exercises to use over the next few weeks in order to improve this pain.  Patient agrees with plan for discharge at this time with orthopedic follow-up as needed.  Patient given strict return precautions and all questions answered prior to discharge  Dispo: Discharge home with PCP and orthopedic follow-up as needed   FINAL CLINICAL IMPRESSION(S) / ED DIAGNOSES   Final diagnoses:  Right flank pain   Rx / DC Orders   ED Discharge Orders          Ordered    cyclobenzaprine (FLEXERIL) 10 MG tablet  3 times daily PRN        12/24/23 2127    lidocaine  (LIDODERM ) 5 %  Every 24 hours        12/24/23 2127           Note:  This document was prepared using Dragon voice recognition software and may include unintentional dictation errors.   Consuelo Suthers K, MD 12/24/23 [phone]

## 2023-12-24 NOTE — ED Triage Notes (Signed)
 Pt arrives via POV with right sided flank pain that starts below their armpit and shoots across their abdomen. Pt states that they have been experiencing this for about a week, and states that the pain comes and go. Pt is A&Ox4 and ambulatory during triage.

## 2024-01-12 ENCOUNTER — Ambulatory Visit: Attending: Cardiology

## 2024-01-12 DIAGNOSIS — Z95810 Presence of automatic (implantable) cardiac defibrillator: Secondary | ICD-10-CM | POA: Diagnosis not present

## 2024-01-12 DIAGNOSIS — I5022 Chronic systolic (congestive) heart failure: Secondary | ICD-10-CM

## 2024-01-13 ENCOUNTER — Telehealth: Payer: Self-pay

## 2024-01-13 NOTE — Progress Notes (Signed)
 EPIC Encounter for ICM Monitoring  Patient Name: [name] [name] is a 63 y.o. male Date: 01/13/2024 Primary Care Physican: [facility] Primary Cardiologist: [name] Electrophysiologist: [facility] 12/24/2022 Weight: 240 lbs    01/29/2024 Weight: 238 lbs  03/07/2023 Weight: 238 lbs   04/10/2023 Weight: 238 lbs  05/13/2023 Weight: 233 lbs   07/23/2023 Weight: 232-233 lbs   09/24/2023 Weight: 233 lbs            10/30/2023 Weight: 234 lbs    12/04/2023 Weight:  238 lbs   Attempted call to patient and unable to reach.  Left detailed message per DPR regarding transmission.  Transmission results reviewed.    Since 12/01/2023 ICM Remote Transmission: CorVue thoracic impedance suggesting pattern of possible fluid accumulation 3-4 days alternating with possible 5-6 days of dryness.      Prescribed:  Furosemide  20 mg take 1 tablet by mouth PRN.  Can take if > 3 lbs weight gain in 24 hours or > 5 lbs in a week or developing of leg swelling Spironolactone  25 mg take 1 tablet daily   Labs: 08/21/2023 Creatinine 0.90, BUN 16, Potassium 4.2, Sodium 137, >60 05/21/2023 Creatinine 1.18, BUN 16, Potassium 4.1, Sodium 136, >60 A complete set of results can be found in Results Review.   Recommendations:  Left voice mail with ICM number and encouraged to call if experiencing any fluid symptoms.   Follow-up plan: ICM clinic phone appointment on 02/16/2024.   91 day device clinic remote transmission 01/27/2024.     EP/Cardiology Office Visits:    03/16/2024 with [name] Arthur, PA.   Recall 05/09/2024 with Dr Gardenia.      Copy of ICM check sent to Dr. Inocencio.   Remote monitoring is medically necessary for Heart Failure Management.    Daily Thoracic Impedance ICM trend: 10/14/2023 through 01/12/2024.    12-14 Month Thoracic Impedance ICM trend:     [name] GORMAN Garner, RN 01/13/2024 3:29 PM

## 2024-01-13 NOTE — Telephone Encounter (Signed)
 Remote ICM transmission received.  Attempted call to patient regarding ICM remote transmission.  Left detailed message per DPR with ICM phone number to return call for any questions, concerns or fluid symptoms.

## 2024-01-15 ENCOUNTER — Telehealth: Payer: Self-pay | Admitting: *Deleted

## 2024-01-15 NOTE — Telephone Encounter (Signed)
 Spoke with patient aware now of  referral  letter  and was given number  to call to schedule appointment

## 2024-01-26 ENCOUNTER — Other Ambulatory Visit (HOSPITAL_COMMUNITY): Payer: Self-pay | Admitting: Nurse Practitioner

## 2024-01-26 DIAGNOSIS — K429 Umbilical hernia without obstruction or gangrene: Secondary | ICD-10-CM

## 2024-01-27 ENCOUNTER — Ambulatory Visit: Payer: Self-pay

## 2024-01-27 DIAGNOSIS — I255 Ischemic cardiomyopathy: Secondary | ICD-10-CM

## 2024-01-30 LAB — CUP PACEART REMOTE DEVICE CHECK
Battery Remaining Longevity: 91 mo
Battery Remaining Percentage: 74 %
Battery Voltage: 3.01 V
Brady Statistic RV Percent Paced: 1 %
Date Time Interrogation Session: 20251126020117
HighPow Impedance: 70 Ohm
Implantable Lead Connection Status: 753985
Implantable Lead Implant Date: 20120605
Implantable Lead Location: 753860
Implantable Pulse Generator Implant Date: 20230224
Lead Channel Impedance Value: 440 Ohm
Lead Channel Pacing Threshold Amplitude: 1 V
Lead Channel Pacing Threshold Pulse Width: 0.5 ms
Lead Channel Sensing Intrinsic Amplitude: 11.8 mV
Lead Channel Setting Pacing Amplitude: 2.5 V
Lead Channel Setting Pacing Pulse Width: 0.5 ms
Lead Channel Setting Sensing Sensitivity: 0.5 mV
Pulse Gen Serial Number: 111056600
Zone Setting Status: 755011

## 2024-02-02 NOTE — Progress Notes (Signed)
 Remote ICD Transmission

## 2024-02-16 ENCOUNTER — Ambulatory Visit

## 2024-02-16 ENCOUNTER — Ambulatory Visit
Admission: RE | Admit: 2024-02-16 | Discharge: 2024-02-16 | Attending: Nurse Practitioner | Admitting: Nurse Practitioner

## 2024-02-16 DIAGNOSIS — Z95810 Presence of automatic (implantable) cardiac defibrillator: Secondary | ICD-10-CM

## 2024-02-16 DIAGNOSIS — I5022 Chronic systolic (congestive) heart failure: Secondary | ICD-10-CM | POA: Insufficient documentation

## 2024-02-16 DIAGNOSIS — K429 Umbilical hernia without obstruction or gangrene: Secondary | ICD-10-CM

## 2024-02-16 MED ORDER — IOHEXOL 300 MG/ML  SOLN
100.0000 mL | Freq: Once | INTRAMUSCULAR | Status: AC | PRN
  Administered 2024-02-16: 09:00:00 100 mL via INTRAVENOUS

## 2024-02-17 ENCOUNTER — Telehealth: Payer: Self-pay

## 2024-02-17 NOTE — Telephone Encounter (Signed)
 Remote ICM transmission received.  Attempted call to patient regarding ICM remote transmission and recording stated call cannot be completed as dialed.  Mychart message sent.

## 2024-02-17 NOTE — Progress Notes (Signed)
 EPIC Encounter for ICM Monitoring  Patient Name: [name] [name] is a 63 y.o. male Date: 02/17/2024 Primary Care Physican: [facility] Primary Cardiologist: [name] Electrophysiologist: [facility] 12/24/2022 Weight: 240 lbs    01/29/2024 Weight: 238 lbs  03/07/2023 Weight: 238 lbs   04/10/2023 Weight: 238 lbs  05/13/2023 Weight: 233 lbs   07/23/2023 Weight: 232-233 lbs   09/24/2023 Weight: 233 lbs            10/30/2023 Weight: 234 lbs    12/04/2023 Weight:  238 lbs   Attempted call to patient and unable to reach.  Mychart message sent with results.  Transmission results reviewed.    Since 01/12/2024 ICM Remote Transmission: CorVue thoracic impedance suggesting possible fluid accumulation starting 02/11/2024 and trending back toward baseline.  Also suggesting possible fluid accumulation from 01/22/2024-02/03/2024.      Prescribed:  Furosemide  20 mg take 1 tablet by mouth PRN.  Can take if > 3 lbs weight gain in 24 hours or > 5 lbs in a week or developing of leg swelling Spironolactone  25 mg take 1 tablet daily   Labs: 12/24/2023 Creatinine 1.10, BUN 18, Potassium 4.0, Sodium 138, GFR >60 11/11/2023 Creatinine 1.05, BUN 16, Potassium 3.8, Sodium 136, GFR >60 08/21/2023 Creatinine 0.90, BUN 16, Potassium 4.2, Sodium 137, >60 05/21/2023 Creatinine 1.18, BUN 16, Potassium 4.1, Sodium 136, >60 A complete set of results can be found in Results Review.   Recommendations:  My chart message sent with results.    Follow-up plan: ICM clinic phone appointment on 02/25/2024 to recheck fluid levels.   91 day device clinic remote transmission 04/27/2024.     EP/Cardiology Office Visits: 03/16/2024 with Charlies Arthur, PA.   Recall 05/09/2024 with Dr Gardenia.      Copy of ICM check sent to Dr. Inocencio.   Remote monitoring is medically necessary for Heart Failure Management.    Daily Thoracic Impedance ICM trend: 11/18/2023 through 02/16/2024.    12-14 Month Thoracic Impedance ICM  trend:     Mitzie GORMAN Garner, RN 02/17/2024 7:53 AM

## 2024-02-25 ENCOUNTER — Ambulatory Visit: Attending: Cardiology

## 2024-02-25 DIAGNOSIS — I5022 Chronic systolic (congestive) heart failure: Secondary | ICD-10-CM

## 2024-02-25 DIAGNOSIS — Z95810 Presence of automatic (implantable) cardiac defibrillator: Secondary | ICD-10-CM

## 2024-02-25 NOTE — Progress Notes (Signed)
 EPIC Encounter for ICM Monitoring  Patient Name: [name] [name] is a 63 y.o. male Date: 02/25/2024 Primary Care Physican: [facility] Primary Cardiologist: [name] Electrophysiologist: [facility] 12/24/2022 Weight: 240 lbs    01/29/2024 Weight: 238 lbs  03/07/2023 Weight: 238 lbs   04/10/2023 Weight: 238 lbs  05/13/2023 Weight: 233 lbs   07/23/2023 Weight: 232-233 lbs   09/24/2023 Weight: 233 lbs            10/30/2023 Weight: 234 lbs    12/04/2023 Weight:  238 lbs   Transmission results reviewed.    Since 02/16/2024 ICM Remote Transmission: CorVue thoracic impedance suggesting fluid levels returned to normal on 02/17/2024.      Prescribed:  Furosemide  20 mg take 1 tablet by mouth PRN.  Can take if > 3 lbs weight gain in 24 hours or > 5 lbs in a week or developing of leg swelling Spironolactone  25 mg take 1 tablet daily   Labs: 12/24/2023 Creatinine 1.10, BUN 18, Potassium 4.0, Sodium 138, GFR >60 11/11/2023 Creatinine 1.05, BUN 16, Potassium 3.8, Sodium 136, GFR >60 08/21/2023 Creatinine 0.90, BUN 16, Potassium 4.2, Sodium 137, >60 05/21/2023 Creatinine 1.18, BUN 16, Potassium 4.1, Sodium 136, >60 A complete set of results can be found in Results Review.   Recommendations:  No changes.   Follow-up plan: ICM clinic phone appointment on 03/22/2024.   91 day device clinic remote transmission 04/27/2024.     EP/Cardiology Office Visits: 03/16/2024 with Charlies Arthur, PA.   Recall 05/09/2024 with Dr Gardenia.      Copy of ICM check sent to Dr. Inocencio.   Remote monitoring is medically necessary for Heart Failure Management.    Daily Thoracic Impedance ICM trend: 11/27/2023 through 02/25/2024.    12-14 Month Thoracic Impedance ICM trend:     Mitzie GORMAN Garner, RN 02/25/2024 9:15 AM

## 2024-03-06 ENCOUNTER — Ambulatory Visit: Payer: Self-pay | Admitting: Cardiology

## 2024-03-11 ENCOUNTER — Encounter: Payer: Self-pay | Admitting: Cardiovascular Disease

## 2024-03-15 NOTE — Progress Notes (Unsigned)
 " Cardiology Office Note:  .   Date:  03/15/2024  ID:  [name] [name], DOB Aug 05, 1960, MRN [id number] PCP: [facility]  [facility] Providers Cardiologist:  Ozell Fell, MD Cardiology APP:  Lelon Glendia DASEN, PA-C  Electrophysiologist:  [name], MD  Advanced Heart Failure:  Ria Commander, DO {  History of Present Illness: [name] [name] is a 64 y.o. male w/PMHx of CAD (MI 2008 tx w/BMS to LAD), ICM, chronic CHF, VT  Admitted Jan 2024 w/VT > LHC showing single-vessel coronary artery disease with total occlusion of the mid to distal LAD within the previously implanted stents, left to left collaterals present supplying the apical LAD, widely patent left main, left circumflex, ramus intermedius, and RCA, with minimal irregularity but no significant stenose. CAD managed medically.   Sept 2024, admitted with VT (required external defibrillation) started on amiodarone  LHC done and unchanged LV apex also noted to be chronically calcified, likely 2/2 old MI  VT was slower then detection rate and required external CV Was hemodynamically tolerated but very symptomatic Discussed plan to re-program VT zones with lower monitoring zone and lower therapy zone with more aggressive ATP to treat further similar arrhythmias   VT Zone 1 150 monitor  VT Zone 2 171 ATP x8 at 85%, then ATP x8 at 82%, then shock x2  VF Unchanged at 214  Saw HF team, 12/18/22, not able to be on SGLT2i with GU infections, pending labs for addition of lasix , suspect trajectory leading to advanced therapies  I saw him Nov 2024 He is doing well Weight is stable Excellent medication compliance Denies bloating, edema, volume OL No CP, palpitations No symptoms like that of his VT No shocks No syncope or syncope No VT No changes made Volume stable  Seeing Dr Nance a few times since as well as Drs. [name] and [name]  Dr. Norton 02/28/23, no VT, amio continued  Dr. 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 He is tolerating amiodarone  without side effect. He has mild exertional dyspnea which is unchanged over time. Discussed amio survellience, no changes made  Dr. Nance 08/21/23, doing well, compliant with his meds,class IIB symptoms TSH /T4 from 07/10/23: 0.224, Free T4 elevated at 1.66.  - Repeat T4 today down to 1.11.  TSH remains suppressed at 0.139.  Planned to d/w EP  I saw him 08/22/23 He is a bit concerned about his thyroid , mentions not a lot of confidence in [facility] in managing it He feels well though.  No CP, palpitations or cardiac awareness No tremor, tremulousness, symptoms of hyperthyroid. No SOB, minimal DOE No near syncope or syncope No shocks With no VT Amiodarone  dose reduced to 100mg  Given a copy of his labs > to bring to his PMD, advised management of thyroid  as PMD felt indicated.  Saw the HF team 11/11/23 Sob with inclines, otherwise good exertional capacity Labs updated  Thyroid  labs continued abnormal Referred to endo Amio stopped > mexiletine started  I saw him 12/15/23 He feels fine! Denies any CP, palpitations or cardiac awareness No SOB Stays busy, likes to work on cars. Doesn't like to sit around No near syncope or syncope No shocks No symptoms of hyperthyroid elicted He has not been called by endo team/office yet for an appt Tolerating mexiletine Reports his BP was getting low > self  reduced his Bidil from TID > BID No VT on device Thyroid  labs done  TSH/labs again abnormal c/w hyperthyroid Urgent endo referral made Despite multiple calls/attempts to connect him to endo, proved very difficult  Ultimately/eventually did get [facility]: TSH 0.017  (from <0.005) T3 129, free T3 4.32 (from 4.9) T4 1.6 (from 2.23) Started on prednisone  Today's visit is scheduled for 69mo  device visit ROS:  He is doing OK No CP, palpitations or cardiac awareness No rest SOB,good exertional capacity No syncope or syncope  Sees endo in Feb  Device information Abbott single chamber ICD implanted 04/27/21  ++ appropriate therapies  Arrhythmia/AAD hx Amiodarone  started Sept 2024 >> stopped Sept 2025 2/2 hyperthyroid Mexiletine started Sept 2025  Studies Reviewed: [name]    EKG not done today  Brief DEVICE interrogation done today to evaluate rhyth/arrhythmia burden and reviewed by myself battery and auto lead measurements are good No VT CorVue looks good  (Full interrogation on 08/22/23, stable lead measurements/threshold)   [facility] 12/02/22   Mid LAD to Dist LAD lesion is 100% stenosed. Right dominant coronary circulation.  Single vessel CAD with CTO of the mid to distal LAD throughout previously stented areas.  Chronic calcification of the LV apex likely secondary to old anterior MI.  LVEDP mildly elevated at 20.  No significant changes from LHC done in 04/01/22.    2D Echo 12/02/22 1. Severely reduced CI by LVOT VTI (0.9L/min/m2). Left ventricular  ejection fraction, by estimation, is 25 to 30%. The left ventricle has  severely decreased function. Left ventricular endocardial border not  optimally defined to evaluate regional wall  motion but the mid-distal anterior walls, the apex, and mid-distal  anterolateral wall appear akinetic. The left ventricular internal cavity  size was severely dilated. There is moderate eccentric left ventricular  hypertrophy. Left ventricular diastolic  parameters are consistent with Grade II diastolic dysfunction  (pseudonormalization).   2. Right ventricular systolic function was not well visualized. The right  ventricular size is not well visualized.   3. Left atrial size was moderately dilated.   4. The mitral valve is grossly normal. Trivial mitral valve  regurgitation. No evidence of mitral stenosis.   5. The aortic valve is  tricuspid. Aortic valve regurgitation is not  visualized. No aortic stenosis is present.      Risk Assessment/Calculations:    Physical Exam:   VS:  There were no vitals taken for this visit.   Wt Readings from Last 3 Encounters:  12/24/23 240 lb (108.9 kg)  12/15/23 240 lb (108.9 kg)  11/11/23 239 lb 9.6 oz (108.7 kg)    GEN: Well nourished, well developed in no acute distress NECK: No JVD; No carotid bruits CARDIAC: RRR, no murmurs, rubs, gallops RESPIRATORY: CTA b/l without rales, wheezing or rhonchi  ABDOMEN: Soft, non-tender, non-distended EXTREMITIES: No edema; No deformity   ICD site: is stable, no thinning, fluctuation, tethering  ASSESSMENT AND PLAN: .    ICD Intact function No programming changes made   VT Off amiodarone  > tolerating mexiletine No VT  Hyperthyroid likely 2/2 his amio Off amio ~ 67mo No symptoms of hyperthyroid [facility]   ICM Chronic CHF CorVue looks good, no symptoms, exam findings of volume OL C/w Dr. Johnnye  CAD No anginal symptoms Caths as above C/w Dr. Matias    Dispo:  remotes as usual, otherwise back with EP in 67mo, sooner if needed  Signed, Charlies Macario Arthur, PA-C   "

## 2024-03-16 ENCOUNTER — Ambulatory Visit: Attending: Physician Assistant | Admitting: Physician Assistant

## 2024-03-16 ENCOUNTER — Ambulatory Visit: Payer: Self-pay | Admitting: Cardiology

## 2024-03-16 VITALS — BP 126/60 | HR 67 | Ht 70.0 in | Wt 244.0 lb

## 2024-03-16 DIAGNOSIS — Z95810 Presence of automatic (implantable) cardiac defibrillator: Secondary | ICD-10-CM | POA: Diagnosis not present

## 2024-03-16 DIAGNOSIS — I2510 Atherosclerotic heart disease of native coronary artery without angina pectoris: Secondary | ICD-10-CM

## 2024-03-16 DIAGNOSIS — I255 Ischemic cardiomyopathy: Secondary | ICD-10-CM | POA: Diagnosis not present

## 2024-03-16 DIAGNOSIS — I472 Ventricular tachycardia, unspecified: Secondary | ICD-10-CM | POA: Diagnosis not present

## 2024-03-16 LAB — CUP PACEART INCLINIC DEVICE CHECK
Battery Remaining Longevity: 91 mo
Brady Statistic RV Percent Paced: 0 %
Date Time Interrogation Session: 20260113113807
HighPow Impedance: 67.5 Ohm
Implantable Lead Connection Status: 753985
Implantable Lead Implant Date: 20120605
Implantable Lead Location: 753860
Implantable Pulse Generator Implant Date: 20230224
Lead Channel Impedance Value: 425 Ohm
Lead Channel Sensing Intrinsic Amplitude: 11.8 mV
Lead Channel Setting Pacing Amplitude: 2.5 V
Lead Channel Setting Pacing Pulse Width: 0.5 ms
Lead Channel Setting Sensing Sensitivity: 0.5 mV
Pulse Gen Serial Number: 111056600
Zone Setting Status: 755011

## 2024-03-16 NOTE — Patient Instructions (Signed)
 Medication Instructions:   Your physician recommends that you continue on your current medications as directed. Please refer to the Current Medication list given to you today.   *If you need a refill on your cardiac medications before your next appointment, please call your pharmacy*   Lab Work: NONE ORDERED  TODAY     If you have labs (blood work) drawn today and your tests are completely normal, you will receive your results only by: MyChart Message (if you have MyChart) OR A paper copy in the mail If you have any lab test that is abnormal or we need to change your treatment, we will call you to review the results.     Testing/Procedures: NONE ORDERED  TODAY      Follow-Up: At [facility], you and your health needs are our priority.  As part of our continuing mission to provide you with exceptional heart care, our providers are all part of one team.  This team includes your primary Cardiologist (physician) and Advanced Practice Providers or APPs (Physician Assistants and Nurse Practitioners) who all work together to provide you with the care you need, when you need it.   Your next appointment:  4 month(s)   Provider:   You may see [name], MD or Charlies Arthur, PA-C ( CONTACT  CASSIE HALL/ ANGELINE HAMMER FOR EP SCHEDULING ISSUES ) \    We recommend signing up for the patient portal called MyChart.  Sign up information is provided on this After Visit Summary.  MyChart is used to connect with patients for Virtual Visits (Telemedicine).  Patients are able to view lab/test results, encounter notes, upcoming appointments, etc.  Non-urgent messages can be sent to your provider as well.   To learn more about what you can do with MyChart, go to [email or web address].   Other Instructions

## 2024-03-22 ENCOUNTER — Ambulatory Visit: Attending: Cardiology

## 2024-03-22 DIAGNOSIS — Z95810 Presence of automatic (implantable) cardiac defibrillator: Secondary | ICD-10-CM | POA: Diagnosis not present

## 2024-03-22 DIAGNOSIS — I5022 Chronic systolic (congestive) heart failure: Secondary | ICD-10-CM | POA: Diagnosis not present

## 2024-03-23 ENCOUNTER — Telehealth: Payer: Self-pay

## 2024-03-23 NOTE — Telephone Encounter (Signed)
 Remote ICM transmission received.  Attempted call to patient regarding ICM remote transmission and no answer.

## 2024-03-23 NOTE — Progress Notes (Signed)
 EPIC Encounter for ICM Monitoring  Patient Name: [name] [name] is a 64 y.o. male Date: 03/23/2024 Primary Care Physican: [facility] Primary Cardiologist: [name] Electrophysiologist: [facility] 12/24/2022 Weight: 240 lbs    01/29/2024 Weight: 238 lbs  03/07/2023 Weight: 238 lbs   04/10/2023 Weight: 238 lbs  05/13/2023 Weight: 233 lbs   07/23/2023 Weight: 232-233 lbs   09/24/2023 Weight: 233 lbs            10/30/2023 Weight: 234 lbs    12/04/2023 Weight:  238 lbs   Attempted call to patient and unable to reach.    Transmission results reviewed.     Since 02/25/2024 ICM Remote Transmission: CorVue thoracic impedance suggesting normal fluid levels.      Prescribed:  Furosemide  20 mg take 1 tablet by mouth PRN.  Can take if > 3 lbs weight gain in 24 hours or > 5 lbs in a week or developing of leg swelling Spironolactone  25 mg take 1 tablet daily   Labs: 12/24/2023 Creatinine 1.10, BUN 18, Potassium 4.0, Sodium 138, GFR >60 11/11/2023 Creatinine 1.05, BUN 16, Potassium 3.8, Sodium 136, GFR >60 08/21/2023 Creatinine 0.90, BUN 16, Potassium 4.2, Sodium 137, >60 05/21/2023 Creatinine 1.18, BUN 16, Potassium 4.1, Sodium 136, >60 A complete set of results can be found in Results Review.   Recommendations:  Unable to reach.     Follow-up plan: ICM clinic phone appointment on 04/29/2024.   91 day device clinic remote transmission 04/28/2024.     EP/Cardiology Office Visits: 07/20/2024 with Dr Inocencio.        Copy of ICM check sent to Dr. Inocencio.    Remote monitoring is medically necessary for Heart Failure Management.    Daily Thoracic Impedance ICM trend: 12/23/2023 through 03/22/2024.    12-14 Month Thoracic Impedance ICM trend:     Mitzie GORMAN Garner, RN 03/23/2024 10:45 AM

## 2024-03-29 ENCOUNTER — Ambulatory Visit

## 2024-04-01 NOTE — Progress Notes (Signed)
 31 day ICM Remote transmission canceled due to [facility] clinic is on hold until further notice.  91 day remote monitoring will continue per protocol.

## 2024-04-05 ENCOUNTER — Ambulatory Visit: Admitting: Endocrinology

## 2024-04-09 ENCOUNTER — Ambulatory Visit: Admitting: Endocrinology

## 2024-04-28 ENCOUNTER — Ambulatory Visit

## 2024-04-29 ENCOUNTER — Ambulatory Visit

## 2024-07-20 ENCOUNTER — Ambulatory Visit: Admitting: Cardiology

## 2024-07-28 ENCOUNTER — Ambulatory Visit

## 2024-10-27 ENCOUNTER — Ambulatory Visit

## 2025-01-26 ENCOUNTER — Ambulatory Visit

## 2025-04-27 ENCOUNTER — Ambulatory Visit

## 2025-07-27 ENCOUNTER — Ambulatory Visit
